# Patient Record
Sex: Female | Born: 1980 | Race: Black or African American | Hispanic: No | Marital: Single | Smoking: Never smoker
Health system: Southern US, Community
[De-identification: ages and names within clinical notes are randomized; demographics above are authoritative.]

## PROBLEM LIST (undated history)

## (undated) ENCOUNTER — Inpatient Hospital Stay (HOSPITAL_COMMUNITY): Payer: Self-pay

## (undated) DIAGNOSIS — D649 Anemia, unspecified: Secondary | ICD-10-CM

## (undated) DIAGNOSIS — R519 Headache, unspecified: Secondary | ICD-10-CM

## (undated) DIAGNOSIS — R87629 Unspecified abnormal cytological findings in specimens from vagina: Secondary | ICD-10-CM

## (undated) DIAGNOSIS — O139 Gestational [pregnancy-induced] hypertension without significant proteinuria, unspecified trimester: Secondary | ICD-10-CM

## (undated) DIAGNOSIS — F32A Depression, unspecified: Secondary | ICD-10-CM

## (undated) DIAGNOSIS — E669 Obesity, unspecified: Secondary | ICD-10-CM

## (undated) DIAGNOSIS — K219 Gastro-esophageal reflux disease without esophagitis: Secondary | ICD-10-CM

## (undated) DIAGNOSIS — B999 Unspecified infectious disease: Secondary | ICD-10-CM

## (undated) DIAGNOSIS — F329 Major depressive disorder, single episode, unspecified: Secondary | ICD-10-CM

## (undated) DIAGNOSIS — R51 Headache: Secondary | ICD-10-CM

## (undated) DIAGNOSIS — I10 Essential (primary) hypertension: Secondary | ICD-10-CM

## (undated) DIAGNOSIS — F419 Anxiety disorder, unspecified: Secondary | ICD-10-CM

## (undated) HISTORY — DX: Gestational (pregnancy-induced) hypertension without significant proteinuria, unspecified trimester: O13.9

## (undated) HISTORY — DX: Essential (primary) hypertension: I10

## (undated) HISTORY — PX: EXTERNAL EAR SURGERY: SHX627

## (undated) HISTORY — PX: THERAPEUTIC ABORTION: SHX798

---

## 2012-08-04 ENCOUNTER — Emergency Department (HOSPITAL_COMMUNITY)
Admission: EM | Admit: 2012-08-04 | Discharge: 2012-08-05 | Disposition: A | Discharge status: Home / Self Care | Payer: Self-pay | Attending: Emergency Medicine | Admitting: Emergency Medicine

## 2012-08-04 DIAGNOSIS — R110 Nausea: Secondary | ICD-10-CM | POA: Insufficient documentation

## 2012-08-04 DIAGNOSIS — B9789 Other viral agents as the cause of diseases classified elsewhere: Secondary | ICD-10-CM | POA: Insufficient documentation

## 2012-08-04 DIAGNOSIS — R51 Headache: Secondary | ICD-10-CM | POA: Insufficient documentation

## 2012-08-04 DIAGNOSIS — R0602 Shortness of breath: Secondary | ICD-10-CM | POA: Insufficient documentation

## 2012-08-04 DIAGNOSIS — R509 Fever, unspecified: Secondary | ICD-10-CM | POA: Insufficient documentation

## 2012-08-04 DIAGNOSIS — R05 Cough: Secondary | ICD-10-CM | POA: Insufficient documentation

## 2012-08-04 DIAGNOSIS — J029 Acute pharyngitis, unspecified: Secondary | ICD-10-CM | POA: Insufficient documentation

## 2012-08-04 DIAGNOSIS — H9209 Otalgia, unspecified ear: Secondary | ICD-10-CM | POA: Insufficient documentation

## 2012-08-04 DIAGNOSIS — R059 Cough, unspecified: Secondary | ICD-10-CM | POA: Insufficient documentation

## 2012-08-04 DIAGNOSIS — R000 Tachycardia, unspecified: Secondary | ICD-10-CM | POA: Insufficient documentation

## 2012-08-05 ENCOUNTER — Emergency Department (HOSPITAL_COMMUNITY): Payer: Self-pay

## 2012-08-05 ENCOUNTER — Encounter (HOSPITAL_COMMUNITY): Payer: Self-pay | Admitting: Emergency Medicine

## 2012-08-05 MED ORDER — OSELTAMIVIR PHOSPHATE 75 MG PO CAPS: 75.0000 mg | ORAL_CAPSULE | Freq: Every day | ORAL | Status: DC

## 2012-08-05 MED ORDER — ONDANSETRON 8 MG PO TBDP: 8.0000 mg | ORAL_TABLET | Freq: Three times a day (TID) | ORAL | Status: DC | PRN

## 2012-08-05 MED ORDER — SODIUM CHLORIDE 0.9 % IV SOLN: 1000.0000 mL | INTRAVENOUS | Status: DC

## 2012-08-05 MED ORDER — MORPHINE SULFATE 4 MG/ML IJ SOLN
4.0000 mg | Freq: Once | INTRAMUSCULAR | Status: AC
  Administered 2012-08-05: 4 mg via INTRAVENOUS
  Filled 2012-08-05: qty 1

## 2012-08-05 MED ORDER — ONDANSETRON HCL 4 MG/2ML IJ SOLN
4.0000 mg | Freq: Once | INTRAMUSCULAR | Status: AC
  Administered 2012-08-05: 4 mg via INTRAVENOUS
  Filled 2012-08-05: qty 2

## 2012-08-05 MED ORDER — ACETAMINOPHEN 500 MG PO TABS
1000.0000 mg | ORAL_TABLET | Freq: Once | ORAL | Status: AC
  Administered 2012-08-05: 1000 mg via ORAL
  Filled 2012-08-05: qty 2

## 2012-08-05 MED ORDER — SODIUM CHLORIDE 0.9 % IV SOLN
1000.0000 mL | Freq: Once | INTRAVENOUS | Status: AC
  Administered 2012-08-05: 1000 mL via INTRAVENOUS

## 2012-08-05 NOTE — ED Notes (Signed)
Pt c/o chest congestion, body aches, HA, nausea, chills, fever, SHOB, sore throat, R ear pain onset yesterday

## 2012-08-05 NOTE — ED Provider Notes (Signed)
History     CSN: [id number]  Arrival date & time 08/04/12  2355   First MD Initiated Contact with Patient 08/05/12 0031      Chief Complaint  Patient presents with  . Influenza    (Consider location/radiation/quality/duration/timing/severity/associated sxs/prior treatment) The history is provided by the patient.   the patient reports myalgias headache nausea chills and fever over the past 24-48 hours.  She's also had some sore throat and shortness of breath.  She reports cough without productive nature.  She denies unilateral leg swelling.  No history of heart failure.  She's also had some right ear pain without drainage.  She overall feels poor and is concerned she might have influenza.  She's had no recent sick contacts.  She did not receive her influenza vaccine this year.  The patient is morbidly obese at 390 pounds.  History reviewed. No pertinent past medical history.  Past Surgical History  Procedure Laterality Date  . External ear surgery      No family history on file.  History  Substance Use Topics  . Smoking status: Never Smoker   . Smokeless tobacco: Not on file  . Alcohol Use: No    OB History   Grav Para Term Preterm Abortions TAB SAB Ect Mult Living                  Review of Systems  All other systems reviewed and are negative.    Allergies  Review of patient's allergies indicates no known allergies.  Home Medications   Current Outpatient Rx  Name  Route  Sig  Dispense  Refill  . acetaminophen (TYLENOL) 500 MG tablet   Oral   Take 500 mg by mouth every 6 (six) hours as needed for pain. For fever/pain         . ibuprofen (ADVIL,MOTRIN) 200 MG tablet   Oral   Take 400 mg by mouth every 4 (four) hours as needed for pain. For fever/pain         . ondansetron (ZOFRAN ODT) 8 MG disintegrating tablet   Oral   Take 1 tablet (8 mg total) by mouth every 8 (eight) hours as needed for nausea.   10 tablet   0   . oseltamivir (TAMIFLU) 75 MG  capsule   Oral   Take 1 capsule (75 mg total) by mouth daily.   5 capsule   0     BP 137/70  Pulse 119  Temp(Src) 101.9 F (38.8 C) (Oral)  Resp 22  Ht 5\' 7"  (1.702 m)  Wt 390 lb (176.903 kg)  BMI 61.07 kg/m2  SpO2 97%  LMP 07/22/2012  Physical Exam  Nursing note and vitals reviewed. Constitutional: She is oriented to person, place, and time. She appears well-developed and well-nourished. No distress.  HENT:  Head: Normocephalic and atraumatic. No trismus in the jaw.  Right Ear: Tympanic membrane, external ear and ear canal normal.  Left Ear: Tympanic membrane and external ear normal.  Mouth/Throat: Uvula is midline and oropharynx is clear and moist. No edematous. No oropharyngeal exudate, posterior oropharyngeal edema, posterior oropharyngeal erythema or tonsillar abscesses.  Eyes: EOM are normal.  Neck: Normal range of motion.  Cardiovascular: Regular rhythm and normal heart sounds.   Tachycardia  Pulmonary/Chest: Effort normal and breath sounds normal.  Abdominal: Soft. She exhibits no distension. There is no tenderness.  Musculoskeletal: Normal range of motion.  Neurological: She is alert and oriented to person, place, and time.  Skin: Skin is  warm and dry.  Psychiatric: She has a normal mood and affect. Judgment normal.    ED Course  Procedures (including critical care time)  Labs Reviewed - No data to display Dg Chest 2 View  08/05/2012  *RADIOLOGY REPORT*  Clinical Data: Upper chest pain, cough, congestion and shortness of breath.  CHEST - 2 VIEW  Comparison: None.  Findings: The lungs are relatively well-aerated.  Minimal left- sided atelectasis is noted.  There is no evidence of focal opacification, pleural effusion or pneumothorax.  The heart is normal in size; the mediastinal contour is within normal limits.  No acute osseous abnormalities are seen.  IMPRESSION: Minimal left-sided atelectasis noted; lungs otherwise clear.   Original Report Authenticated By:  Tonia Ghent, M.D.    I personally reviewed the imaging tests through PACS system I reviewed available ER/hospitalization records through the EMR    1. Influenza-like illness       MDM  2:32 AM ] Patient feels much better at this time.  Heart rate is improving.  This is likely flulike illness.  Home with nausea medicine on Tamiflu.  The patient understands return to ER for new or worsening symptoms.  No hypoxia.  Chest x-ray clear.        Lyanne Co, MD 08/05/12 [phone]

## 2012-10-03 ENCOUNTER — Encounter (HOSPITAL_COMMUNITY): Payer: Self-pay

## 2012-10-03 ENCOUNTER — Emergency Department (HOSPITAL_COMMUNITY)
Admission: EM | Admit: 2012-10-03 | Discharge: 2012-10-03 | Disposition: A | Discharge status: Home Health | Payer: No Typology Code available for payment source | Source: Home / Self Care

## 2012-10-03 DIAGNOSIS — I10 Essential (primary) hypertension: Secondary | ICD-10-CM

## 2012-10-03 LAB — CBC
MCH: 24.5 pg — ABNORMAL LOW (ref 26.0–34.0)
MCV: 75.7 fL — ABNORMAL LOW (ref 78.0–100.0)
Platelets: 320 10*3/uL (ref 150–400)
RDW: 15.2 % (ref 11.5–15.5)

## 2012-10-03 LAB — HEMOGLOBIN A1C: Hgb A1c MFr Bld: 6 % — ABNORMAL HIGH (ref <5.7)

## 2012-10-03 LAB — BASIC METABOLIC PANEL
CO2: 28 mEq/L (ref 19–32)
Calcium: 8.8 mg/dL (ref 8.4–10.5)
Creatinine, Ser: 0.91 mg/dL (ref 0.50–1.10)
GFR calc non Af Amer: 83 mL/min — ABNORMAL LOW (ref >90)
Glucose, Bld: 100 mg/dL — ABNORMAL HIGH (ref 70–99)
Sodium: 138 mEq/L (ref 135–145)

## 2012-10-03 LAB — LIPID PANEL
LDL Cholesterol: 106 mg/dL — ABNORMAL HIGH (ref 0–99)
VLDL: 14 mg/dL (ref 0–40)

## 2012-10-03 NOTE — ED Provider Notes (Signed)
History     CSN: [id number]  Arrival date & time 10/03/12  1021   First MD Initiated Contact with Patient 10/03/12 1033      Chief Complaint  Patient presents with  . Hypertension    (Consider location/radiation/quality/duration/timing/severity/associated sxs/prior treatment) HPI  History reviewed. No pertinent past medical history.  Past Surgical History  Procedure Laterality Date  . External ear surgery      Family history of HTN  History  Substance Use Topics  . Smoking status: Never Smoker   . Smokeless tobacco: Not on file  . Alcohol Use: No    OB History   Grav Para Term Preterm Abortions TAB SAB Ect Mult Living                 Review of Systems Constitutional: Negative for fever, chills, diaphoresis, activity change, appetite change and fatigue.  HENT: Negative for ear pain, nosebleeds, congestion, facial swelling, rhinorrhea, neck pain, neck stiffness and ear discharge.   Eyes: Negative for pain, discharge, redness, itching and visual disturbance.  Respiratory: Negative for cough, choking, chest tightness, shortness of breath, wheezing and stridor.   Cardiovascular: Negative for chest pain, palpitations and leg swelling.  Gastrointestinal: Negative for abdominal distention.  Genitourinary: Negative for dysuria, urgency, frequency, hematuria, flank pain, decreased urine volume, difficulty urinating and dyspareunia.  Musculoskeletal: Negative for back pain, joint swelling, arthralgias and gait problem.  Neurological: Negative for dizziness, tremors, seizures, syncope, facial asymmetry, speech difficulty, weakness, light-headedness, numbness and headaches.  Hematological: Negative for adenopathy. Does not bruise/bleed easily.  Psychiatric/Behavioral: Negative for hallucinations, behavioral problems, confusion, dysphoric mood, decreased concentration and agitation.   Allergies  Review of patient's allergies indicates no known allergies.  Home Medications    Current Outpatient Rx  Name  Route  Sig  Dispense  Refill  . acetaminophen (TYLENOL) 500 MG tablet   Oral   Take 500 mg by mouth every 6 (six) hours as needed for pain. For fever/pain         . ibuprofen (ADVIL,MOTRIN) 200 MG tablet   Oral   Take 400 mg by mouth every 4 (four) hours as needed for pain. For fever/pain         . ondansetron (ZOFRAN ODT) 8 MG disintegrating tablet   Oral   Take 1 tablet (8 mg total) by mouth every 8 (eight) hours as needed for nausea.   10 tablet   0   . oseltamivir (TAMIFLU) 75 MG capsule   Oral   Take 1 capsule (75 mg total) by mouth daily.   5 capsule   0     BP 138/80  Pulse 68  Temp(Src) 98.7 F (37.1 C) (Oral)  Resp 16  SpO2 100%  Physical Exam Constitutional: Appears well-developed and well-nourished. No distress.  HENT: Normocephalic. External right and left ear normal. Oropharynx is clear and moist.  Eyes: Conjunctivae and EOM are normal. PERRLA, no scleral icterus.  Neck: Normal ROM. Neck supple. No JVD. No tracheal deviation. No thyromegaly.  CVS: RRR, S1/S2 +, no murmurs, no gallops, no carotid bruit.  Pulmonary: Effort and breath sounds normal, no stridor, rhonchi, wheezes, rales.  Abdominal: Soft. BS +,  no distension, tenderness, rebound or guarding.  Musculoskeletal: Normal range of motion. No edema and no tenderness.  Lymphadenopathy: No lymphadenopathy noted, cervical, inguinal. Neuro: Alert. Normal reflexes, muscle tone coordination. No cranial nerve deficit. Skin: Skin is warm and dry. No rash noted. Not diaphoretic. No erythema. No pallor.  Psychiatric: Normal mood and  affect. Behavior, judgment, thought content normal.   ED Course  Procedures (including critical care time)  Labs Reviewed - No data to display No results found.  HTN - Reasonable control, we have discussed importance of checking blood pressure regularly and patient advised to call us back if the numbers are persistently higher than 140/90 -  Will goahead and check electrolyte panel, will also check A1c since patient has family history of diabetes - Will also check lipid panel and will add statin if indicated  MDM  HTN, blood work including electrolyte panel, CBC, A1c, lipid panel       Dorothea Ogle, MD 10/03/12 1100

## 2012-10-03 NOTE — ED Notes (Addendum)
Patient is here to establish care Needs a physical

## 2012-11-01 ENCOUNTER — Telehealth: Payer: Self-pay | Admitting: General Practice

## 2012-11-01 NOTE — Telephone Encounter (Signed)
Pt came in and had labs done about 1 month ago and has not been called with results.

## 2012-11-03 ENCOUNTER — Telehealth: Payer: Self-pay

## 2012-11-03 NOTE — Telephone Encounter (Signed)
Lab results given Per dr Laural Benes we reccomended that patient start taking over the counter Iron and follow up in 2 months

## 2013-01-22 ENCOUNTER — Emergency Department (HOSPITAL_COMMUNITY): Payer: No Typology Code available for payment source

## 2013-01-22 ENCOUNTER — Emergency Department (HOSPITAL_COMMUNITY)
Admission: EM | Admit: 2013-01-22 | Discharge: 2013-01-22 | Disposition: A | Discharge status: Home / Self Care | Payer: No Typology Code available for payment source | Attending: Emergency Medicine | Admitting: Emergency Medicine

## 2013-01-22 ENCOUNTER — Encounter (HOSPITAL_COMMUNITY): Payer: Self-pay | Admitting: Emergency Medicine

## 2013-01-22 DIAGNOSIS — M7989 Other specified soft tissue disorders: Secondary | ICD-10-CM | POA: Insufficient documentation

## 2013-01-22 DIAGNOSIS — M25559 Pain in unspecified hip: Secondary | ICD-10-CM | POA: Insufficient documentation

## 2013-01-22 DIAGNOSIS — M79609 Pain in unspecified limb: Secondary | ICD-10-CM | POA: Insufficient documentation

## 2013-01-22 DIAGNOSIS — M79605 Pain in left leg: Secondary | ICD-10-CM

## 2013-01-22 DIAGNOSIS — M25552 Pain in left hip: Secondary | ICD-10-CM

## 2013-01-22 MED ORDER — HYDROCODONE-ACETAMINOPHEN 5-325 MG PO TABS
2.0000 | ORAL_TABLET | Freq: Once | ORAL | Status: AC
  Administered 2013-01-22: 2 via ORAL
  Filled 2013-01-22: qty 2

## 2013-01-22 MED ORDER — TRAMADOL HCL 50 MG PO TABS: 50.0000 mg | ORAL_TABLET | Freq: Four times a day (QID) | ORAL | Status: DC | PRN

## 2013-01-22 NOTE — ED Provider Notes (Addendum)
CSN: [id number]     Arrival date & time 01/22/13  1222 History     First MD Initiated Contact with Patient 01/22/13 1242     Chief Complaint  Patient presents with  . Leg Swelling  . Leg Pain   (Consider location/radiation/quality/duration/timing/severity/associated sxs/prior Treatment) Patient is a 32 y.o. female presenting with leg pain. The history is provided by the patient.  Leg Pain Associated symptoms: no back pain, no fever and no neck pain   pt c/o left hip and leg pain for past day. Constant. Dull. Moderate. Denies hx same pain. States leg feels swollen.  Denies hx dvt or pe.  No skin changes, redness, rash or lesions.  Denies low back or buttock pain. No radicular pain. No hx ddd.  No numbness/weakness. Denies specific exacerbating or alleviating factors. Is ambulatory. Denies recent trauma or fall. No cp or sob. States in all other respects seems in normal well state of health. No fever or chills.     History reviewed. No pertinent past medical history. Past Surgical History  Procedure Laterality Date  . External ear surgery     No family history on file. History  Substance Use Topics  . Smoking status: Never Smoker   . Smokeless tobacco: Not on file  . Alcohol Use: No   OB History   Grav Para Term Preterm Abortions TAB SAB Ect Mult Living                 Review of Systems  Constitutional: Negative for fever and chills.  HENT: Negative for neck pain.   Eyes: Negative for redness.  Respiratory: Negative for shortness of breath.   Cardiovascular: Negative for chest pain.  Gastrointestinal: Negative for abdominal pain.  Genitourinary: Negative for flank pain and pelvic pain.  Musculoskeletal: Negative for back pain and gait problem.  Skin: Negative for rash.  Neurological: Negative for weakness, numbness and headaches.  Hematological: Does not bruise/bleed easily.  Psychiatric/Behavioral: Negative for confusion.    Allergies  Review of patient's allergies  indicates no known allergies.  Home Medications   Current Outpatient Rx  Name  Route  Sig  Dispense  Refill  . Aspirin-Caffeine (BACK & BODY EXTRA STRENGTH) 500-32.5 MG TABS   Oral   Take 2 tablets by mouth every 4 (four) hours as needed (pain).         . traMADol (ULTRAM) 50 MG tablet   Oral   Take 25 mg by mouth once.          BP 125/74  Pulse 78  Temp(Src) 98.7 F (37.1 C) (Oral)  Resp 17  SpO2 100% Physical Exam  Nursing note and vitals reviewed. Constitutional: She appears well-developed and well-nourished. No distress.  HENT:  Head: Atraumatic.  Eyes: Conjunctivae are normal. No scleral icterus.  Neck: Neck supple. No tracheal deviation present.  Cardiovascular: Normal rate, regular rhythm, normal heart sounds and intact distal pulses.   Pulmonary/Chest: Effort normal and breath sounds normal. No respiratory distress.  Abdominal: Soft. Normal appearance. She exhibits no distension. There is no tenderness.  Morbidly obese  Musculoskeletal: She exhibits no edema and no tenderness.  ls spine non tender.  Pain/tenderness left hip laterally.  No skin changes or sts noted. Distal pulses palp. Good rom at hip, knee and ankle without pain. No other focal bony tenderness noted.   Neurological: She is alert.  Skin: Skin is warm and dry. No rash noted.  Psychiatric: She has a normal mood and affect.  ED Course   Procedures (including critical care time)  Dg Hip Complete Left  01/22/2013   *RADIOLOGY REPORT*  Clinical Data: Pain  LEFT HIP - COMPLETE 2+ VIEW  Comparison: None.  Findings: Three views of the left hip submitted.  No acute fracture or subluxation.  No radiopaque foreign body.  IMPRESSION: No acute fracture or subluxation.   Original Report Authenticated By: Natasha Mead, M.D.     Patient Name Sex DOB SSN   Glass Francess, Mullen Female [birth year] [id number]            Progress Notes signed by Smiley Houseman, RVT at 01/22/2013 2:44 PM    Author: Smiley Houseman, RVT Service: Vascular Lab Author Type: Cardiovascular Sonographer   Filed: 01/22/2013 2:44 PM Note Time: 01/22/2013 2:43 PM         Left lower extremity venous duplex completed. Left: No evidence of DVT, superficial thrombosis, or Baker's cyst. Right: Negative for DVT in the common femoral vein.        MDM  Pt states she has a ride, does not have to drive. No meds pta.  vicodin po.  Xr.  Vascular ultrasound.  Reviewed nursing notes and prior charts for additional history.   Vascular tech indicates study negative for dvt.  Pt afeb.  Comfortable on recheck. xr neg. vasc doppler neg.  Pt appears stable for d/c.   Discussed close pcp follow up. Pt states pcp is Dr Laural Benes.      Suzi Roots, MD 01/22/13 1450

## 2013-01-22 NOTE — Progress Notes (Signed)
Left lower extremity venous duplex completed.  Left:  No evidence of DVT, superficial thrombosis, or Baker's cyst.  Right:  Negative for DVT in the common femoral vein.  

## 2013-01-22 NOTE — ED Notes (Signed)
Pt c/o of left leg pain 10/10. Noticed swelling to left leg last night. Denies n/v/d. No hx of DVT.

## 2013-01-22 NOTE — Progress Notes (Signed)
Pt confirms pcp is [facility] updated

## 2013-03-05 ENCOUNTER — Encounter: Payer: Self-pay | Admitting: Internal Medicine

## 2013-03-05 ENCOUNTER — Ambulatory Visit: Payer: No Typology Code available for payment source | Attending: Internal Medicine | Admitting: Internal Medicine

## 2013-03-05 VITALS — BP 142/84 | HR 84 | Temp 98.6°F | Resp 16 | Ht 67.72 in | Wt >= 6400 oz

## 2013-03-05 DIAGNOSIS — R05 Cough: Secondary | ICD-10-CM

## 2013-03-05 DIAGNOSIS — Z0000 Encounter for general adult medical examination without abnormal findings: Secondary | ICD-10-CM

## 2013-03-05 LAB — POCT GLYCOSYLATED HEMOGLOBIN (HGB A1C): Hemoglobin A1C: 5.8

## 2013-03-05 MED ORDER — GUAIFENESIN ER 600 MG PO TB12: 1200.0000 mg | ORAL_TABLET | Freq: Two times a day (BID) | ORAL | Status: DC | PRN

## 2013-03-05 MED ORDER — AZITHROMYCIN 500 MG PO TABS: 500.0000 mg | ORAL_TABLET | Freq: Every day | ORAL | Status: DC

## 2013-03-05 NOTE — Progress Notes (Signed)
Patient ID: Jillian Reed, female   DOB: 02-11-1981, 32 y.o.   MRN: [id number]  CC: congestion      HPI: 32 year old female with no significant past medical history presents with on and off cough and congestion for past 2 weeks prior to this visit. Patient reports sometimes having choking sensation when coughing spells occur. She does not take anything for cough. She reports associated subjective fever but no chills. No chest pain. No palpitations. Patient also reports generalized weakness. No bowel pain, nausea or vomiting. No diarrhea.  No Known Allergies No past medical history on file. Current Outpatient Prescriptions on File Prior to Visit  Medication Sig Dispense Refill  . Aspirin-Caffeine (BACK & BODY EXTRA STRENGTH) 500-32.5 MG TABS Take 2 tablets by mouth every 4 (four) hours as needed (pain).      . traMADol (ULTRAM) 50 MG tablet Take 25 mg by mouth once.      . traMADol (ULTRAM) 50 MG tablet Take 1 tablet (50 mg total) by mouth every 6 (six) hours as needed for pain.  20 tablet  0   No current facility-administered medications on file prior to visit.   Family medical history significant for HTN, HLD  History   Social History  . Marital Status: Single    Spouse Name: N/A    Number of Children: N/A  . Years of Education: N/A   Occupational History  . Not on file.   Social History Main Topics  . Smoking status: Never Smoker   . Smokeless tobacco: Not on file  . Alcohol Use: No  . Drug Use: No  . Sexual Activity: Not on file   Other Topics Concern  . Not on file   Social History Narrative  . No narrative on file    Review of Systems  Constitutional: positive for fever, no chills, diaphoresis, activity change, appetite change and fatigue.  HENT: Negative for ear pain, nosebleeds, congestion, facial swelling, rhinorrhea, neck pain, neck stiffness and ear discharge.   Eyes: Negative for pain, discharge, redness, itching and visual disturbance.  Respiratory:  positive for cough, no choking, chest tightness, shortness of breath, wheezing and stridor.   Cardiovascular: Negative for chest pain, palpitations and leg swelling.  Gastrointestinal: Negative for abdominal distention.  Genitourinary: Negative for dysuria, urgency, frequency, hematuria, flank pain, decreased urine volume, difficulty urinating and dyspareunia.  Musculoskeletal: Negative for back pain, joint swelling, arthralgias and gait problem.  Neurological: Negative for dizziness, tremors, seizures, syncope, facial asymmetry, speech difficulty, weakness, light-headedness, numbness and headaches.  Hematological: Negative for adenopathy. Does not bruise/bleed easily.  Psychiatric/Behavioral: Negative for hallucinations, behavioral problems, confusion, dysphoric mood, decreased concentration and agitation.    Objective:   Filed Vitals:   03/05/13 1247  BP: 142/84  Pulse: 84  Temp: 98.6 F (37 C)  Resp: 16    Physical Exam  Constitutional: Appears well-developed and well-nourished. No distress.  HENT: Normocephalic. External right and left ear normal. Oropharynx is clear and moist.  Eyes: Conjunctivae and EOM are normal. PERRLA, no scleral icterus.  Neck: Normal ROM. Neck supple. No JVD. No tracheal deviation. No thyromegaly.  CVS: RRR, S1/S2 +, no murmurs, no gallops, no carotid bruit.  Pulmonary: Effort and breath sounds normal, no stridor, rhonchi, wheezes, rales.  Abdominal: Soft. BS +,  no distension, tenderness, rebound or guarding.  Musculoskeletal: Normal range of motion. No edema and no tenderness.  Lymphadenopathy: No lymphadenopathy noted, cervical, inguinal. Neuro: Alert. Normal reflexes, muscle tone coordination. No cranial nerve deficit. Skin:  Skin is warm and dry. No rash noted. Not diaphoretic. No erythema. No pallor.  Psychiatric: Normal mood and affect. Behavior, judgment, thought content normal.   Lab Results  Component Value Date   WBC 8.8 10/03/2012   HGB  10.9* 10/03/2012   HCT 33.7* 10/03/2012   MCV 75.7* 10/03/2012   PLT 320 10/03/2012   Lab Results  Component Value Date   CREATININE 0.91 10/03/2012   BUN 13 10/03/2012   NA 138 10/03/2012   K 3.7 10/03/2012   CL 101 10/03/2012   CO2 28 10/03/2012    Lab Results  Component Value Date   HGBA1C 6.0* 10/03/2012   Lipid Panel     Component Value Date/Time   CHOL 174 10/03/2012 1056   TRIG 72 10/03/2012 1056   HDL 54 10/03/2012 1056   CHOLHDL 3.2 10/03/2012 1056   VLDL 14 10/03/2012 1056   LDLCALC 106* 10/03/2012 1056       Assessment and plan:   Patient Active Problem List   Diagnosis Date Noted  . Cough 03/05/2013    Priority: High - prescription provided for azithromycin 500 mg daily for 5 days - mucinex prn

## 2013-03-05 NOTE — Progress Notes (Signed)
Pt is here for F/U visit. Pt reports having a tickle in her throat. When she coughs sharp pain shoot down neck, chest and her Left arm goes numb.

## 2013-03-05 NOTE — Patient Instructions (Addendum)
Health Maintenance, Females A healthy lifestyle and preventative care can promote health and wellness.  Maintain regular health, dental, and eye exams.  Eat a healthy diet. Foods like vegetables, fruits, whole grains, low-fat dairy products, and lean protein foods contain the nutrients you need without too many calories. Decrease your intake of foods high in solid fats, added sugars, and salt. Get information about a proper diet from your caregiver, if necessary.  Regular physical exercise is one of the most important things you can do for your health. Most adults should get at least 150 minutes of moderate-intensity exercise (any activity that increases your heart rate and causes you to sweat) each week. In addition, most adults need muscle-strengthening exercises on 2 or more days a week.   Maintain a healthy weight. The body mass index (BMI) is a screening tool to identify possible weight problems. It provides an estimate of body fat based on height and weight. Your caregiver can help determine your BMI, and can help you achieve or maintain a healthy weight. For adults 20 years and older:  A BMI below 18.5 is considered underweight.  A BMI of 18.5 to 24.9 is normal.  A BMI of 25 to 29.9 is considered overweight.  A BMI of 30 and above is considered obese.  Maintain normal blood lipids and cholesterol by exercising and minimizing your intake of saturated fat. Eat a balanced diet with plenty of fruits and vegetables. Blood tests for lipids and cholesterol should begin at age 20 and be repeated every 5 years. If your lipid or cholesterol levels are high, you are over 50, or you are a high risk for heart disease, you may need your cholesterol levels checked more frequently.Ongoing high lipid and cholesterol levels should be treated with medicines if diet and exercise are not effective.  If you smoke, find out from your caregiver how to quit. If you do not use tobacco, do not start.  If you  are pregnant, do not drink alcohol. If you are breastfeeding, be very cautious about drinking alcohol. If you are not pregnant and choose to drink alcohol, do not exceed 1 drink per day. One drink is considered to be 12 ounces (355 mL) of beer, 5 ounces (148 mL) of wine, or 1.5 ounces (44 mL) of liquor.  Avoid use of street drugs. Do not share needles with anyone. Ask for help if you need support or instructions about stopping the use of drugs.  High blood pressure causes heart disease and increases the risk of stroke. Blood pressure should be checked at least every 1 to 2 years. Ongoing high blood pressure should be treated with medicines, if weight loss and exercise are not effective.  If you are 55 to 32 years old, ask your caregiver if you should take aspirin to prevent strokes.  Diabetes screening involves taking a blood sample to check your fasting blood sugar level. This should be done once every 3 years, after age 45, if you are within normal weight and without risk factors for diabetes. Testing should be considered at a younger age or be carried out more frequently if you are overweight and have at least 1 risk factor for diabetes.  Breast cancer screening is essential preventative care for women. You should practice "breast self-awareness." This means understanding the normal appearance and feel of your breasts and may include breast self-examination. Any changes detected, no matter how small, should be reported to a caregiver. Women in their 20s and 30s should have   a clinical breast exam (CBE) by a caregiver as part of a regular health exam every 1 to 3 years. After age 40, women should have a CBE every year. Starting at age 40, women should consider having a mammogram (breast X-ray) every year. Women who have a family history of breast cancer should talk to their caregiver about genetic screening. Women at a high risk of breast cancer should talk to their caregiver about having an MRI and a  mammogram every year.  The Pap test is a screening test for cervical cancer. Women should have a Pap test starting at age 21. Between ages 21 and 29, Pap tests should be repeated every 2 years. Beginning at age 30, you should have a Pap test every 3 years as long as the past 3 Pap tests have been normal. If you had a hysterectomy for a problem that was not cancer or a condition that could lead to cancer, then you no longer need Pap tests. If you are between ages 65 and 70, and you have had normal Pap tests going back 10 years, you no longer need Pap tests. If you have had past treatment for cervical cancer or a condition that could lead to cancer, you need Pap tests and screening for cancer for at least 20 years after your treatment. If Pap tests have been discontinued, risk factors (such as a new sexual partner) need to be reassessed to determine if screening should be resumed. Some women have medical problems that increase the chance of getting cervical cancer. In these cases, your caregiver may recommend more frequent screening and Pap tests.  The human papillomavirus (HPV) test is an additional test that may be used for cervical cancer screening. The HPV test looks for the virus that can cause the cell changes on the cervix. The cells collected during the Pap test can be tested for HPV. The HPV test could be used to screen women aged 30 years and older, and should be used in women of any age who have unclear Pap test results. After the age of 30, women should have HPV testing at the same frequency as a Pap test.  Colorectal cancer can be detected and often prevented. Most routine colorectal cancer screening begins at the age of 50 and continues through age 75. However, your caregiver may recommend screening at an earlier age if you have risk factors for colon cancer. On a yearly basis, your caregiver may provide home test kits to check for hidden blood in the stool. Use of a small camera at the end of a  tube, to directly examine the colon (sigmoidoscopy or colonoscopy), can detect the earliest forms of colorectal cancer. Talk to your caregiver about this at age 50, when routine screening begins. Direct examination of the colon should be repeated every 5 to 10 years through age 75, unless early forms of pre-cancerous polyps or small growths are found.  Hepatitis C blood testing is recommended for all people born from 1945 through 1965 and any individual with known risks for hepatitis C.  Practice safe sex. Use condoms and avoid high-risk sexual practices to reduce the spread of sexually transmitted infections (STIs). Sexually active women aged 25 and younger should be checked for Chlamydia, which is a common sexually transmitted infection. Older women with new or multiple partners should also be tested for Chlamydia. Testing for other STIs is recommended if you are sexually active and at increased risk.  Osteoporosis is a disease in which the   bones lose minerals and strength with aging. This can result in serious bone fractures. The risk of osteoporosis can be identified using a bone density scan. Women ages 57 and over and women at risk for fractures or osteoporosis should discuss screening with their caregivers. Ask your caregiver whether you should be taking a calcium supplement or vitamin D to reduce the rate of osteoporosis.  Menopause can be associated with physical symptoms and risks. Hormone replacement therapy is available to decrease symptoms and risks. You should talk to your caregiver about whether hormone replacement therapy is right for you.  Use sunscreen with a sun protection factor (SPF) of 30 or greater. Apply sunscreen liberally and repeatedly throughout the day. You should seek shade when your shadow is shorter than you. Protect yourself by wearing long sleeves, pants, a wide-brimmed hat, and sunglasses year round, whenever you are outdoors.  Notify your caregiver of new moles or  changes in moles, especially if there is a change in shape or color. Also notify your caregiver if a mole is larger than the size of a pencil eraser.  Stay current with your immunizations. Document Released: 12/21/2010 Document Revised: 08/30/2011 Document Reviewed: 12/21/2010 [facility] Patient Information 2014 [place], [place]. Cough, Adult  A cough is a reflex that helps clear your throat and airways. It can help heal the body or may be a reaction to an irritated airway. A cough may only last 2 or 3 weeks (acute) or may last more than 8 weeks (chronic).  CAUSES Acute cough:  Viral or bacterial infections. Chronic cough:  Infections.  Allergies.  Asthma.  Post-nasal drip.  Smoking.  Heartburn or acid reflux.  Some medicines.  Chronic lung problems (COPD).  Cancer. SYMPTOMS   Cough.  Fever.  Chest pain.  Increased breathing rate.  High-pitched whistling sound when breathing (wheezing).  Colored mucus that you cough up (sputum). TREATMENT   A bacterial cough may be treated with antibiotic medicine.  A viral cough must run its course and will not respond to antibiotics.  Your caregiver may recommend other treatments if you have a chronic cough. HOME CARE INSTRUCTIONS   Only take over-the-counter or prescription medicines for pain, discomfort, or fever as directed by your caregiver. Use cough suppressants only as directed by your caregiver.  Use a cold steam vaporizer or humidifier in your bedroom or home to help loosen secretions.  Sleep in a semi-upright position if your cough is worse at night.  Rest as needed.  Stop smoking if you smoke. SEEK IMMEDIATE MEDICAL CARE IF:   You have pus in your sputum.  Your cough starts to worsen.  You cannot control your cough with suppressants and are losing sleep.  You begin coughing up blood.  You have difficulty breathing.  You develop pain which is getting worse or is uncontrolled with medicine.  You have  a fever. MAKE SURE YOU:   Understand these instructions.  Will watch your condition.  Will get help right away if you are not doing well or get worse. Document Released: 12/04/2010 Document Revised: 08/30/2011 Document Reviewed: 12/04/2010 [facility] Patient Information 2014 [place], [place].

## 2013-04-03 ENCOUNTER — Emergency Department (HOSPITAL_COMMUNITY): Payer: No Typology Code available for payment source

## 2013-04-03 ENCOUNTER — Encounter (HOSPITAL_COMMUNITY): Payer: Self-pay | Admitting: Emergency Medicine

## 2013-04-03 ENCOUNTER — Emergency Department (HOSPITAL_COMMUNITY)
Admission: EM | Admit: 2013-04-03 | Discharge: 2013-04-03 | Disposition: A | Discharge status: Home / Self Care | Payer: No Typology Code available for payment source | Attending: Emergency Medicine | Admitting: Emergency Medicine

## 2013-04-03 DIAGNOSIS — R079 Chest pain, unspecified: Secondary | ICD-10-CM | POA: Insufficient documentation

## 2013-04-03 DIAGNOSIS — R059 Cough, unspecified: Secondary | ICD-10-CM | POA: Insufficient documentation

## 2013-04-03 DIAGNOSIS — J3489 Other specified disorders of nose and nasal sinuses: Secondary | ICD-10-CM | POA: Insufficient documentation

## 2013-04-03 DIAGNOSIS — R0981 Nasal congestion: Secondary | ICD-10-CM

## 2013-04-03 DIAGNOSIS — R1110 Vomiting, unspecified: Secondary | ICD-10-CM | POA: Insufficient documentation

## 2013-04-03 DIAGNOSIS — R05 Cough: Secondary | ICD-10-CM | POA: Insufficient documentation

## 2013-04-03 MED ORDER — IPRATROPIUM BROMIDE 0.03 % NA SOLN
2.0000 | Freq: Four times a day (QID) | NASAL | Status: DC
Start: 2013-04-03 — End: 2013-04-03
  Administered 2013-04-03: 2 via NASAL
  Filled 2013-04-03: qty 30

## 2013-04-03 MED ORDER — IPRATROPIUM BROMIDE 0.06 % NA SOLN
2.0000 | Freq: Four times a day (QID) | NASAL | Status: DC
  Filled 2013-04-03: qty 15

## 2013-04-03 MED ORDER — IPRATROPIUM BROMIDE 0.03 % NA SOLN: 2.0000 | Freq: Four times a day (QID) | NASAL | Status: DC

## 2013-04-03 NOTE — Discharge Instructions (Signed)
Cough, Adult  A cough is a reflex. It helps you clear your throat and airways. A cough can help heal your body. A cough can last 2 or 3 weeks (acute) or may last more than 8 weeks (chronic). Some common causes of a cough can include an infection, allergy, or a cold. HOME CARE  Only take medicine as told by your doctor.  If given, take your medicines (antibiotics) as told. Finish them even if you start to feel better.  Use a cold steam vaporizer or humidier in your home. This can help loosen thick spit (secretions).  Sleep so you are almost sitting up (semi-upright). Use pillows to do this. This helps reduce coughing.  Rest as needed.  Stop smoking if you smoke. GET HELP RIGHT AWAY IF:  You have yellowish-white fluid (pus) in your thick spit.  Your cough gets worse.  Your medicine does not reduce coughing, and you are losing sleep.  You cough up blood.  You have trouble breathing.  Your pain gets worse and medicine does not help.  You have a fever. MAKE SURE YOU:   Understand these instructions.  Will watch your condition.  Will get help right away if you are not doing well or get worse. Document Released: 02/18/2011 Document Revised: 08/30/2011 Document Reviewed: 02/18/2011 [facility] Patient Information 2014 [place], [place]. He been given an Atrovent nasal inhaler to decrease the amount of secretions.  Please uses on her regular basis.  Followup with your primary care physician you have also been given incentive spirometer to help you with deep breathing

## 2013-04-03 NOTE — ED Notes (Signed)
Pt given IS and instructed how to use

## 2013-04-03 NOTE — ED Provider Notes (Addendum)
CSN: [id number]     Arrival date & time 04/03/13  0128 History   First MD Initiated Contact with Patient 04/03/13 0158     Chief Complaint  Patient presents with  . Cough  . Emesis   (Consider location/radiation/quality/duration/timing/severity/associated sxs/prior Treatment) HPI Comments: Has been having a intermittent cough for the past 6 weeks.  She was seen by her primary care physician on September 15, with the same complaint of a cough.  That gives her burning sensation in her chest and makes her left arm going on, now she's having post tussive emesis, which has started within the past 2-3, days.  Denies any fever, shortness of breath.  URI, symptoms, history of, asthma, history of hypertension.  She does have a family history of hypertension, and MI.  This patient is a morbidly obese  Patient is a 32 y.o. female presenting with cough and vomiting. The history is provided by the patient.  Cough Cough characteristics:  Non-productive Severity:  Moderate Onset quality:  Gradual Duration:  6 weeks Timing:  Intermittent Progression:  Worsening Smoker: no   Relieved by:  None tried Worsened by:  Activity Associated symptoms: chest pain   Associated symptoms: no shortness of breath   Emesis   History reviewed. No pertinent past medical history. Past Surgical History  Procedure Laterality Date  . External ear surgery     No family history on file. History  Substance Use Topics  . Smoking status: Never Smoker   . Smokeless tobacco: Not on file  . Alcohol Use: Yes   OB History   Grav Para Term Preterm Abortions TAB SAB Ect Mult Living                 Review of Systems  Respiratory: Positive for cough. Negative for shortness of breath.   Cardiovascular: Positive for chest pain.  Gastrointestinal: Positive for vomiting.    Allergies  Tramadol  Home Medications   Current Outpatient Rx  Name  Route  Sig  Dispense  Refill  . ipratropium (ATROVENT) 0.03 % nasal  spray   Nasal   Place 2 sprays into the nose 4 (four) times daily.   30 mL   2    BP 145/79  Pulse 88  Temp(Src) 98.5 F (36.9 C) (Oral)  Resp 18  Ht 5\' 7"  (1.702 m)  Wt 422 lb (191.418 kg)  BMI 66.08 kg/m2  SpO2 100%  LMP 03/22/2013 Physical Exam  Nursing note and vitals reviewed. Constitutional: She appears well-developed and well-nourished.  HENT:  Head: Normocephalic.  Mouth/Throat: Posterior oropharyngeal erythema present.  Eyes: Pupils are equal, round, and reactive to light.  Neck: Normal range of motion.  Cardiovascular: Normal rate.   Abdominal: Soft.  Skin: Skin is warm and dry. No rash noted. No erythema.    ED Course  Procedures (including critical care time) Labs Review Labs Reviewed - No data to display Imaging Review No results found.  EKG Interpretation   None       MDM   1. Nasal congestion   2. Cough        Arman Filter, NP 04/09/13 2039  Arman Filter, NP 04/10/13 2017

## 2013-04-03 NOTE — ED Notes (Signed)
Pt reports a tickle in her throat that started one month ago that preceded to a cough. Now pt states that she has been vomiting with coughing. Pt states that coughing gives her a burning sensation to her mid chest and back and cause her L arm to go numb.

## 2013-04-10 NOTE — ED Provider Notes (Signed)
Medical screening examination/treatment/procedure(s) were performed by non-physician practitioner and as supervising physician I was immediately available for consultation/collaboration.   Ahni Bradwell, MD 04/10/13 0649 

## 2013-04-11 NOTE — ED Provider Notes (Signed)
Medical screening examination/treatment/procedure(s) were performed by non-physician practitioner and as supervising physician I was immediately available for consultation/collaboration.  EKG Interpretation   None         Etoy Mcdonnell, MD 04/11/13 0707 

## 2013-11-09 ENCOUNTER — Emergency Department (HOSPITAL_COMMUNITY)
Admission: EM | Admit: 2013-11-09 | Discharge: 2013-11-09 | Disposition: A | Discharge status: Home / Self Care | Payer: No Typology Code available for payment source | Attending: Emergency Medicine | Admitting: Emergency Medicine

## 2013-11-09 ENCOUNTER — Encounter (HOSPITAL_COMMUNITY): Payer: Self-pay | Admitting: Emergency Medicine

## 2013-11-09 DIAGNOSIS — A599 Trichomoniasis, unspecified: Secondary | ICD-10-CM | POA: Insufficient documentation

## 2013-11-09 DIAGNOSIS — Z9889 Other specified postprocedural states: Secondary | ICD-10-CM | POA: Insufficient documentation

## 2013-11-09 DIAGNOSIS — J069 Acute upper respiratory infection, unspecified: Secondary | ICD-10-CM | POA: Insufficient documentation

## 2013-11-09 DIAGNOSIS — Z3202 Encounter for pregnancy test, result negative: Secondary | ICD-10-CM | POA: Insufficient documentation

## 2013-11-09 LAB — URINALYSIS, ROUTINE W REFLEX MICROSCOPIC
BILIRUBIN URINE: NEGATIVE
GLUCOSE, UA: NEGATIVE mg/dL
HGB URINE DIPSTICK: NEGATIVE
KETONES UR: NEGATIVE mg/dL
Nitrite: NEGATIVE
PH: 7 (ref 5.0–8.0)
Protein, ur: NEGATIVE mg/dL
Specific Gravity, Urine: 1.016 (ref 1.005–1.030)
Urobilinogen, UA: 0.2 mg/dL (ref 0.0–1.0)

## 2013-11-09 LAB — URINE MICROSCOPIC-ADD ON

## 2013-11-09 LAB — PREGNANCY, URINE: Preg Test, Ur: NEGATIVE

## 2013-11-09 MED ORDER — AZITHROMYCIN 250 MG PO TABS
1000.0000 mg | ORAL_TABLET | Freq: Once | ORAL | Status: AC
  Administered 2013-11-09: 1000 mg via ORAL
  Filled 2013-11-09: qty 4

## 2013-11-09 MED ORDER — METRONIDAZOLE 500 MG PO TABS: 500.0000 mg | ORAL_TABLET | Freq: Two times a day (BID) | ORAL | Status: DC

## 2013-11-09 MED ORDER — CEFTRIAXONE SODIUM 250 MG IJ SOLR
250.0000 mg | Freq: Once | INTRAMUSCULAR | Status: AC
  Administered 2013-11-09: 250 mg via INTRAMUSCULAR
  Filled 2013-11-09: qty 250

## 2013-11-09 MED ORDER — DOXYCYCLINE HYCLATE 100 MG PO CAPS: 100.0000 mg | ORAL_CAPSULE | Freq: Two times a day (BID) | ORAL | Status: DC

## 2013-11-09 NOTE — ED Notes (Signed)
Pt c/o productive cough x 3 days, coughing up green sputum. Pt also c/o bilateral ear pain.

## 2013-11-09 NOTE — ED Provider Notes (Signed)
CSN: [id number]     Arrival date & time 11/09/13  0998 History   First MD Initiated Contact with Patient 11/09/13 1005     Chief Complaint  Patient presents with  . Otalgia  . Cough     (Consider location/radiation/quality/duration/timing/severity/associated sxs/prior Treatment) HPI Comments: Pt states that she is having a productive cough, sore throat and earache times 3 days. Denies fever. Hasn't taken anything for the symptoms. Pt states that she is also having urinary frequency and some lower back pain. Denies abdominal pain or vaginal discharge  No language interpreter was used.    History reviewed. No pertinent past medical history. Past Surgical History  Procedure Laterality Date  . External ear surgery     No family history on file. History  Substance Use Topics  . Smoking status: Never Smoker   . Smokeless tobacco: Not on file  . Alcohol Use: Yes   OB History   Grav Para Term Preterm Abortions TAB SAB Ect Mult Living                 Review of Systems  Constitutional: Negative.   HENT: Positive for congestion.   Respiratory: Positive for cough.   Cardiovascular: Negative.       Allergies  Tramadol  Home Medications   Prior to Admission medications   Medication Sig Start Date End Date Taking? Authorizing Provider  ipratropium (ATROVENT) 0.03 % nasal spray Place 2 sprays into the nose 4 (four) times daily. 04/03/13   Arman Filter, NP   BP 137/111  Pulse 91  Temp(Src) 98.2 F (36.8 C) (Oral)  Resp 18  SpO2 100%  LMP 10/11/2013 Physical Exam  Nursing note and vitals reviewed. Constitutional: She is oriented to person, place, and time. She appears well-developed and well-nourished.  HENT:  Right Ear: External ear normal.  Left Ear: External ear normal.  Mouth/Throat: Posterior oropharyngeal erythema present.  Cardiovascular: Normal rate and regular rhythm.   Pulmonary/Chest: Effort normal and breath sounds normal.  Abdominal: Soft. Bowel sounds  are normal.  Musculoskeletal: Normal range of motion.  Neurological: She is alert and oriented to person, place, and time.  Skin: Skin is warm and dry.  Psychiatric: She has a normal mood and affect.    ED Course  Procedures (including critical care time) Labs Review Labs Reviewed  URINALYSIS, ROUTINE W REFLEX MICROSCOPIC - Abnormal; Notable for the following:    APPearance CLOUDY (*)    Leukocytes, UA LARGE (*)    All other components within normal limits  URINE CULTURE  PREGNANCY, URINE  URINE MICROSCOPIC-ADD ON    Imaging Review No results found.   EKG Interpretation None      MDM   Final diagnoses:  URI (upper respiratory infection)  Trichomonas infection    Discussed finding with pt and std prevention. Will treat for std. Discussed need for treatment of partner and follow up for continued symptoms    Teressa Lower, NP 11/09/13 1057

## 2013-11-09 NOTE — Discharge Instructions (Signed)
No sexual activity for the next 10 days. Your partner or partners need to be treated Upper Respiratory Infection, Adult An upper respiratory infection (URI) is also sometimes known as the common cold. The upper respiratory tract includes the nose, sinuses, throat, trachea, and bronchi. Bronchi are the airways leading to the lungs. Most people improve within 1 week, but symptoms can last up to 2 weeks. A residual cough may last even longer.  CAUSES Many different viruses can infect the tissues lining the upper respiratory tract. The tissues become irritated and inflamed and often become very moist. Mucus production is also common. A cold is contagious. You can easily spread the virus to others by oral contact. This includes kissing, sharing a glass, coughing, or sneezing. Touching your mouth or nose and then touching a surface, which is then touched by another person, can also spread the virus. SYMPTOMS  Symptoms typically develop 1 to 3 days after you come in contact with a cold virus. Symptoms vary from person to person. They may include:  Runny nose.  Sneezing.  Nasal congestion.  Sinus irritation.  Sore throat.  Loss of voice (laryngitis).  Cough.  Fatigue.  Muscle aches.  Loss of appetite.  Headache.  Low-grade fever. DIAGNOSIS  You might diagnose your own cold based on familiar symptoms, since most people get a cold 2 to 3 times a year. Your caregiver can confirm this based on your exam. Most importantly, your caregiver can check that your symptoms are not due to another disease such as strep throat, sinusitis, pneumonia, asthma, or epiglottitis. Blood tests, throat tests, and X-rays are not necessary to diagnose a common cold, but they may sometimes be helpful in excluding other more serious diseases. Your caregiver will decide if any further tests are required. RISKS AND COMPLICATIONS  You may be at risk for a more severe case of the common cold if you smoke cigarettes, have  chronic heart disease (such as heart failure) or lung disease (such as asthma), or if you have a weakened immune system. The very young and very old are also at risk for more serious infections. Bacterial sinusitis, middle ear infections, and bacterial pneumonia can complicate the common cold. The common cold can worsen asthma and chronic obstructive pulmonary disease (COPD). Sometimes, these complications can require emergency medical care and may be life-threatening. PREVENTION  The best way to protect against getting a cold is to practice good hygiene. Avoid oral or hand contact with people with cold symptoms. Wash your hands often if contact occurs. There is no clear evidence that vitamin C, vitamin E, echinacea, or exercise reduces the chance of developing a cold. However, it is always recommended to get plenty of rest and practice good nutrition. TREATMENT  Treatment is directed at relieving symptoms. There is no cure. Antibiotics are not effective, because the infection is caused by a virus, not by bacteria. Treatment may include:  Increased fluid intake. Sports drinks offer valuable electrolytes, sugars, and fluids.  Breathing heated mist or steam (vaporizer or shower).  Eating chicken soup or other clear broths, and maintaining good nutrition.  Getting plenty of rest.  Using gargles or lozenges for comfort.  Controlling fevers with ibuprofen or acetaminophen as directed by your caregiver.  Increasing usage of your inhaler if you have asthma. Zinc gel and zinc lozenges, taken in the first 24 hours of the common cold, can shorten the duration and lessen the severity of symptoms. Pain medicines may help with fever, muscle aches, and throat  pain. A variety of non-prescription medicines are available to treat congestion and runny nose. Your caregiver can make recommendations and may suggest nasal or lung inhalers for other symptoms.  HOME CARE INSTRUCTIONS   Only take over-the-counter or  prescription medicines for pain, discomfort, or fever as directed by your caregiver.  Use a warm mist humidifier or inhale steam from a shower to increase air moisture. This may keep secretions moist and make it easier to breathe.  Drink enough water and fluids to keep your urine clear or pale yellow.  Rest as needed.  Return to work when your temperature has returned to normal or as your caregiver advises. You may need to stay home longer to avoid infecting others. You can also use a face mask and careful hand washing to prevent spread of the virus. SEEK MEDICAL CARE IF:   After the first few days, you feel you are getting worse rather than better.  You need your caregiver's advice about medicines to control symptoms.  You develop chills, worsening shortness of breath, or brown or red sputum. These may be signs of pneumonia.  You develop yellow or brown nasal discharge or pain in the face, especially when you bend forward. These may be signs of sinusitis.  You develop a fever, swollen neck glands, pain with swallowing, or white areas in the back of your throat. These may be signs of strep throat. SEEK IMMEDIATE MEDICAL CARE IF:   You have a fever.  You develop severe or persistent headache, ear pain, sinus pain, or chest pain.  You develop wheezing, a prolonged cough, cough up blood, or have a change in your usual mucus (if you have chronic lung disease).  You develop sore muscles or a stiff neck. Document Released: 12/01/2000 Document Revised: 08/30/2011 Document Reviewed: 10/09/2010 [facility] Patient Information 2014 [place], [place].  Trichomoniasis Trichomoniasis is an infection, caused by the Trichomonas organism, that affects both women and men. In women, the outer female genitalia and the vagina are affected. In men, the penis is mainly affected, but the prostate and other reproductive organs can also be involved. Trichomoniasis is a sexually transmitted disease (STD) and is  most often passed to another person through sexual contact. The majority of people who get trichomoniasis do so from a sexual encounter and are also at risk for other STDs. CAUSES   Sexual intercourse with an infected partner.  It can be present in swimming pools or hot tubs. SYMPTOMS   Abnormal gray-green frothy vaginal discharge in women.  Vaginal itching and irritation in women.  Itching and irritation of the area outside the vagina in women.  Penile discharge with or without pain in males.  Inflammation of the urethra (urethritis), causing painful urination.  Bleeding after sexual intercourse. RELATED COMPLICATIONS  Pelvic inflammatory disease.  Infection of the uterus (endometritis).  Infertility.  Tubal (ectopic) pregnancy.  It can be associated with other STDs, including gonorrhea and chlamydia, hepatitis B, and HIV. COMPLICATIONS DURING PREGNANCY  Early (premature) delivery.  Premature rupture of the membranes (PROM).  Low birth weight. DIAGNOSIS   Visualization of Trichomonas under the microscope from the vagina discharge.  Ph of the vagina greater than 4.5, tested with a test tape.  Trich Rapid Test.  Culture of the organism, but this is not usually needed.  It may be found on a Pap test.  Having a "strawberry cervix,"which means the cervix looks very red like a strawberry. TREATMENT   You may be given medication to fight the  infection. Inform your caregiver if you could be or are pregnant. Some medications used to treat the infection should not be taken during pregnancy.  Over-the-counter medications or creams to decrease itching or irritation may be recommended.  Your sexual partner will need to be treated if infected. HOME CARE INSTRUCTIONS   Take all medication prescribed by your caregiver.  Take over-the-counter medication for itching or irritation as directed by your caregiver.  Do not have sexual intercourse while you have the  infection.  Do not douche or wear tampons.  Discuss your infection with your partner, as your partner may have acquired the infection from you. Or, your partner may have been the person who transmitted the infection to you.  Have your sex partner examined and treated if necessary.  Practice safe, informed, and protected sex.  See your caregiver for other STD testing. SEEK MEDICAL CARE IF:   You still have symptoms after you finish the medication.  You have an oral temperature above 102 F (38.9 C).  You develop belly (abdominal) pain.  You have pain when you urinate.  You have bleeding after sexual intercourse.  You develop a rash.  The medication makes you sick or makes you throw up (vomit). Document Released: 12/01/2000 Document Revised: 08/30/2011 Document Reviewed: 12/27/2008 [facility] Patient Information 2014 [place], [place].

## 2013-11-09 NOTE — ED Provider Notes (Signed)
Medical screening examination/treatment/procedure(s) were performed by non-physician practitioner and as supervising physician I was immediately available for consultation/collaboration.  Flint Melter, MD 11/09/13 [phone]

## 2013-11-10 LAB — URINE CULTURE

## 2013-12-02 ENCOUNTER — Encounter (HOSPITAL_COMMUNITY): Payer: Self-pay | Admitting: Emergency Medicine

## 2013-12-02 ENCOUNTER — Emergency Department (HOSPITAL_COMMUNITY)
Admission: EM | Admit: 2013-12-02 | Discharge: 2013-12-02 | Disposition: A | Discharge status: Home / Self Care | Payer: No Typology Code available for payment source | Attending: Emergency Medicine | Admitting: Emergency Medicine

## 2013-12-02 DIAGNOSIS — H9209 Otalgia, unspecified ear: Secondary | ICD-10-CM | POA: Insufficient documentation

## 2013-12-02 DIAGNOSIS — M7989 Other specified soft tissue disorders: Secondary | ICD-10-CM | POA: Insufficient documentation

## 2013-12-02 DIAGNOSIS — J029 Acute pharyngitis, unspecified: Secondary | ICD-10-CM | POA: Insufficient documentation

## 2013-12-02 DIAGNOSIS — M79609 Pain in unspecified limb: Secondary | ICD-10-CM

## 2013-12-02 DIAGNOSIS — R519 Headache, unspecified: Secondary | ICD-10-CM

## 2013-12-02 DIAGNOSIS — K029 Dental caries, unspecified: Secondary | ICD-10-CM | POA: Insufficient documentation

## 2013-12-02 DIAGNOSIS — R51 Headache: Secondary | ICD-10-CM

## 2013-12-02 MED ORDER — IBUPROFEN 600 MG PO TABS: 600.0000 mg | ORAL_TABLET | Freq: Three times a day (TID) | ORAL | Status: DC | PRN

## 2013-12-02 MED ORDER — IBUPROFEN 800 MG PO TABS
800.0000 mg | ORAL_TABLET | Freq: Once | ORAL | Status: AC
  Administered 2013-12-02: 800 mg via ORAL
  Filled 2013-12-02: qty 1

## 2013-12-02 MED ORDER — PENICILLIN V POTASSIUM 500 MG PO TABS
500.0000 mg | ORAL_TABLET | Freq: Once | ORAL | Status: AC
  Administered 2013-12-02: 500 mg via ORAL
  Filled 2013-12-02: qty 1

## 2013-12-02 MED ORDER — PENICILLIN V POTASSIUM 500 MG PO TABS: 500.0000 mg | ORAL_TABLET | Freq: Four times a day (QID) | ORAL | Status: AC

## 2013-12-02 MED ORDER — HYDROCODONE-ACETAMINOPHEN 5-325 MG PO TABS: 1.0000 | ORAL_TABLET | ORAL | Status: DC | PRN

## 2013-12-02 NOTE — ED Notes (Signed)
Pt reports  left calf swelling x4, noticeable larger than right calf, denies pain in leg. Pt reports sore throat x4 days with no pain in her right ear. Pain 8/10.

## 2013-12-02 NOTE — Progress Notes (Addendum)
VASCULAR LAB PRELIMINARY  PRELIMINARY  PRELIMINARY  PRELIMINARY  Left lower extremity venous duplex completed.    Preliminary report:  Left:  No obvious evidence of DVT, superficial thrombosis, or Baker's cyst. Technically limited due to body habitus  Jillian Reed, RVS 12/02/2013, 2:02 PM

## 2013-12-02 NOTE — ED Provider Notes (Signed)
CSN: [id number]     Arrival date & time 12/02/13  1143 History   First MD Initiated Contact with Patient 12/02/13 1158     Chief Complaint  Patient presents with  . Leg Swelling  . Sore Throat  . Otalgia      HPI Patient ports in crease seeing sore throat, right sided dental pain, right ear pain over the past 4 days.  Her pain is moderate in severity.  No difficulty breathing or swallowing.  No change in her voice.  No anterior neck pain.  No discharge from her ear.  No fevers or chills.  No chest pain or shortness of breath.  Denies abdominal pain.  She has no shortness of breath.  She does report some increasing left calf pain and swelling over the past several days.  No history of DVT or pulmonary embolism.   History reviewed. No pertinent past medical history. Past Surgical History  Procedure Laterality Date  . External ear surgery     History reviewed. No pertinent family history. History  Substance Use Topics  . Smoking status: Never Smoker   . Smokeless tobacco: Not on file  . Alcohol Use: Yes   OB History   Grav Para Term Preterm Abortions TAB SAB Ect Mult Living                 Review of Systems  All other systems reviewed and are negative.     Allergies  Tramadol  Home Medications   Prior to Admission medications   Medication Sig Start Date End Date Taking? Authorizing Provider  HYDROcodone-acetaminophen (NORCO/VICODIN) 5-325 MG per tablet Take 1 tablet by mouth every 4 (four) hours as needed for moderate pain. 12/02/13   Lyanne CoKevin M Kimmie Berggren, MD  ibuprofen (ADVIL,MOTRIN) 600 MG tablet Take 1 tablet (600 mg total) by mouth every 8 (eight) hours as needed. 12/02/13   Lyanne CoKevin M Tyrann Donaho, MD  penicillin v potassium (VEETID) 500 MG tablet Take 1 tablet (500 mg total) by mouth 4 (four) times daily. 12/02/13 12/09/13  Lyanne CoKevin M Kyarra Vancamp, MD   BP 148/52  Pulse 89  Temp(Src) 99.3 F (37.4 C) (Oral)  Resp 18  SpO2 100%  LMP 10/11/2013 Physical Exam  Nursing note and vitals  reviewed. Constitutional: She is oriented to person, place, and time. She appears well-developed and well-nourished. No distress.  HENT:  Head: Normocephalic and atraumatic.  Uvula midline.  Mild tonsillar and posterior pharyngeal erythema without exudate.  Tolerating secretions.  Oral airway patent.  Mild dental decay of her right upper second molar without gingival swelling or fluctuance.  Bilateral TMs are normal.  External auditory canals are normal.  Bilateral external ears are normal.  No cervical adenopathy noted  Eyes: EOM are normal.  Neck: Normal range of motion.  Cardiovascular: Normal rate, regular rhythm and normal heart sounds.   Pulmonary/Chest: Effort normal and breath sounds normal.  Abdominal: Soft. She exhibits no distension. There is no tenderness.  Musculoskeletal: Normal range of motion.  Morbidly obese lower extremities.  Left calf swelling as compared to right.  Normal PT and DP pulse in left foot.  Full range of motion of left ankle left knee.  No left mid thigh tenderness.  No left lower extremity erythema or warmth or focal tenderness  Neurological: She is alert and oriented to person, place, and time.  Skin: Skin is warm and dry.  Psychiatric: She has a normal mood and affect. Judgment normal.    ED Course  Procedures (including  critical care time) Labs Review Labs Reviewed - No data to display  Imaging Review No results found.   EKG Interpretation None      MDM   Final diagnoses:  Facial pain  Left leg swelling    Left lower extremity vascular duplex negative for DVT.  Will treat patient's throat ear and jaw pain with penicillins this may represent dental infection.  No obvious dental abscess.  No signs of peritonsillar abscess    Lyanne Co, MD 12/02/13 1441

## 2014-01-31 ENCOUNTER — Emergency Department (HOSPITAL_COMMUNITY)
Admission: EM | Admit: 2014-01-31 | Discharge: 2014-01-31 | Disposition: A | Discharge status: Home / Self Care | Payer: No Typology Code available for payment source | Attending: Emergency Medicine | Admitting: Emergency Medicine

## 2014-01-31 ENCOUNTER — Encounter (HOSPITAL_COMMUNITY): Payer: Self-pay | Admitting: Emergency Medicine

## 2014-01-31 DIAGNOSIS — K0889 Other specified disorders of teeth and supporting structures: Secondary | ICD-10-CM

## 2014-01-31 DIAGNOSIS — K029 Dental caries, unspecified: Secondary | ICD-10-CM | POA: Insufficient documentation

## 2014-01-31 DIAGNOSIS — K089 Disorder of teeth and supporting structures, unspecified: Secondary | ICD-10-CM | POA: Insufficient documentation

## 2014-01-31 MED ORDER — OXYCODONE-ACETAMINOPHEN 5-325 MG PO TABS: 1.0000 | ORAL_TABLET | Freq: Four times a day (QID) | ORAL | Status: DC | PRN

## 2014-01-31 MED ORDER — PENICILLIN V POTASSIUM 500 MG PO TABS: 500.0000 mg | ORAL_TABLET | Freq: Four times a day (QID) | ORAL | Status: AC

## 2014-01-31 MED ORDER — BUPIVACAINE-EPINEPHRINE (PF) 0.5% -1:200000 IJ SOLN
1.8000 mL | Freq: Once | INTRAMUSCULAR | Status: DC
  Filled 2014-01-31: qty 1.8

## 2014-01-31 NOTE — Discharge Instructions (Signed)
Dental Pain °A tooth ache may be caused by cavities (tooth decay). Cavities expose the nerve of the tooth to air and hot or cold temperatures. It may come from an infection or abscess (also called a boil or furuncle) around your tooth. It is also often caused by dental caries (tooth decay). This causes the pain you are having. °DIAGNOSIS  °Your caregiver can diagnose this problem by exam. °TREATMENT  °· If caused by an infection, it may be treated with medications which kill germs (antibiotics) and pain medications as prescribed by your caregiver. Take medications as directed. °· Only take over-the-counter or prescription medicines for pain, discomfort, or fever as directed by your caregiver. °· Whether the tooth ache today is caused by infection or dental disease, you should see your dentist as soon as possible for further care. °SEEK MEDICAL CARE IF: °The exam and treatment you received today has been provided on an emergency basis only. This is not a substitute for complete medical or dental care. If your problem worsens or new problems (symptoms) appear, and you are unable to meet with your dentist, call or return to this location. °SEEK IMMEDIATE MEDICAL CARE IF:  °· You have a fever. °· You develop redness and swelling of your face, jaw, or neck. °· You are unable to open your mouth. °· You have severe pain uncontrolled by pain medicine. °MAKE SURE YOU:  °· Understand these instructions. °· Will watch your condition. °· Will get help right away if you are not doing well or get worse. °Document Released: 06/07/2005 Document Revised: 08/30/2011 Document Reviewed: 01/24/2008 °ExitCare® Patient Information ©2015 [employer]. This information is not intended to replace advice given to you by your health care provider. Make sure you discuss any questions you have with your health care provider. ° °Emergency Department Resource Guide °1) Find a Doctor and Pay Out of Pocket °Although you won't have to find out who  is covered by your insurance plan, it is a good idea to ask around and get recommendations. You will then need to call the office and see if the doctor you have chosen will accept you as a new patient and what types of options they offer for patients who are self-pay. Some doctors offer discounts or will set up payment plans for their patients who do not have insurance, but you will need to ask so you aren't surprised when you get to your appointment. ° °2) Contact Your Local Health Department °Not all health departments have doctors that can see patients for sick visits, but many do, so it is worth a call to see if yours does. If you don't know where your local health department is, you can check in your phone book. The CDC also has a tool to help you locate your state's health department, and many state websites also have listings of all of their local health departments. ° °3) Find a Walk-in Clinic °If your illness is not likely to be very severe or complicated, you may want to try a walk in clinic. These are popping up all over the country in pharmacies, drugstores, and shopping centers. They're usually staffed by nurse practitioners or physician assistants that have been trained to treat common illnesses and complaints. They're usually fairly quick and inexpensive. However, if you have serious medical issues or chronic medical problems, these are probably not your best option. ° °No Primary Care Doctor: °- Call [facility] at  832-8000 - they can help you locate a primary   care doctor that  accepts your insurance, provides certain services, etc. °- Physician Referral Service- 1-800-533-3463 ° °Chronic Pain Problems: °Organization         Address  Phone   Notes  °[facility]  (336) 297-2271 Patients need to be referred by their primary care doctor.  ° °Medication Assistance: °Organization         Address  Phone   Notes  °[place] [facility] [address]  [address] °[place], [place] [address] (336) 641-8030 --Must be a resident of [place] °-- Must have NO insurance coverage whatsoever (no Medicaid/ Medicare, etc.) °-- The pt. MUST have a primary care doctor that directs their care regularly and follows them in the community °  °[facility]  (866) 331-1348   °[employer]  (888) 892-1162   ° °Agencies that provide inexpensive medical care: °Organization         Address  Phone   Notes  °[facility]  (336) 832-8035   °[facility]    (336) 832-7272   °[facility] [address] °[place], [place] [address] (336) 832-4777   °[facility] [address], °[place] (336) 271-4999   °[facility]    (336) 373-0678   °[facility]    (336) 272-1050   °[facility] ° [address], [place] Phone:  (336) 832-4444, Fax:  (336) 832-4440 Hours of Operation:  9 am - 6 pm, M-F.  Also accepts Medicaid/Medicare and self-pay.  °[facility] ° [address], [place] Phone: (336) 832-3150, Fax: (336) 832-3151. Hours of Operation:  8:30 am - 5:30 pm, M-F.  Also accepts Medicaid and self-pay.  °[facility] [place] [address], [place] Phone: (336) 878-6027   °[facility] [address], [place], [place] (336)723-1848, Ext. 123 Mondays & Thursdays: 7-9 AM.  First 15 patients are seen on a first come, first serve basis. °  ° °Medicaid-accepting [place] Providers: ° °Organization         Address  Phone   Notes  °[facility] [address], [place] (336) 641-2100 Also accepts self-pay patients.  °[facility] [address], [place] ° (336) 856-9996   °[facility] [address], [place] (336) 288-8857   °[facility] [address] [place] [address], [place] (336) 299-7000   °Veita Bland [address], [place]  ° (336) 373-1557 Only accepts [place] Access Medicaid patients after they have their name applied to their card.  ° °Self-Pay (no insurance) in [place]: ° °Organization         Address  Phone   Notes  °Sickle Cell Patients, [facility] [address], [place] (336) 832-1970   °[facility] [address], [place] (336) 832-4400   °[facility] ° [address], [place] (336) 992-4800   °[facility]/Dr. Osei-Bonsu ° [address] [place] [address], [place] or [address], [place] (336) 841-8500 Phone number for both [place] and [place] locations is the same.  °[facility] [address], [place] (336) 299-0000   °[facility] [address] [place] [address],  or [address] (336) 852-7530 °(336) 878-2260   °  [facility] [address], [place] (336) 350-1642, phone; (336) 294-5005, fax Sees patients 1st and 3rd Saturday of every month.  Must not qualify for public or private insurance (i.e. Medicaid, Medicare, [place] Health Choice, Veterans' Benefits) • Household income should be no more than 200% of the poverty level •The clinic cannot treat you if you are pregnant or think you are pregnant • Sexually transmitted diseases are not treated at the clinic.  ° ° °Dental Care: °Organization         Address  Phone  Notes  °[facility] [facility] [address], [place] (336) 641-6152 Accepts children up to age 21 who are enrolled in Medicaid or [place] Health Choice; pregnant women with a Medicaid card; and children who have applied for Medicaid or [place] Health Choice, but were declined, whose parents can pay a reduced fee at time of service.  °[facility] [place]  [address], [place] (336) 641-7733 Accepts children up to age 21 who are  enrolled in Medicaid or [place] Health Choice; pregnant women with a Medicaid card; and children who have applied for Medicaid or [place] Health Choice, but were declined, whose parents can pay a reduced fee at time of service.  °[facility] ° [address], [place] (336) 641-4533 Patients are seen by appointment only. Walk-ins are not accepted. [facility] will see patients 18 years of age and older. °Monday - Tuesday (8am-5pm) °Most Wednesdays (8:30-5pm) °$30 per visit, cash only  °[facility] ° [address], [place] (336) 641-4533 Patients are seen by appointment only. Walk-ins are not accepted. [facility] will see patients 18 years of age and older. °One Wednesday Evening (Monthly: Volunteer Based).  $30 per visit, cash only  °[facility]  (919) 537-3737 for adults; Children under age 4, call Graduate Pediatric Dentistry at (919) [phone]. Children aged 4-14, please call (919) 537-3737 to request a pediatric application. ° Dental services are provided in all areas of dental care including fillings, crowns and bridges, complete and partial dentures, implants, gum treatment, root canals, and extractions. Preventive care is also provided. Treatment is provided to both adults and children. °Patients are selected via a lottery and there is often a waiting list. °  °[facility] [address], °[place] ° (336) 763-8833 [email or web address] °  °[facility] [address], [place], [place] (336)723-1848, Ext. 123 Second and Fourth Thursday of each month, opens at 6:30 AM; Clinic ends at 9 AM.  Patients are seen on a first-come first-served basis, and a limited number are seen during each clinic.  ° °[facility] ° [address], [place], [place] (336) 723-7904   Eligibility Requirements °You must have lived in [place], [place], or [place] counties for at least the last three months. °  You  cannot be eligible for state or federal sponsored healthcare insurance, including Veterans Administration, Medicaid, or Medicare. °  You generally cannot be eligible for healthcare insurance through your employer.  °  How to apply: °Eligibility screenings are held every Tuesday and Wednesday afternoon from 1:00 pm until 4:00 pm. You do not need an appointment for the interview!  °[facility] [address], [place], [place] 336-631-2330   °[facility]  336-342-8273   °[facility]  336-703-3100   °[facility]  336-570-6415   ° °Behavioral Health Resources in the Community: °Intensive Outpatient Programs °Organization         Address  Phone  Notes  °[facility] [address], [place], [place] 336-878-6098   °[facility] [address], [place], [place] 336-832-9800   °[facility] [address], [place], [place] ° 336-882-2125   °[facility] [address],  °[place], [place] 1-800-853-5163 or 336-641-4981   °Substance Abuse Resources °Organization         Address  Phone  Notes  °[facility]  336-882-2125   °[facility]  336-784-9470   °[facility]  336-285-9073   °[facility]  336-845-3988   °Residential & Outpatient Substance Abuse Program  1-800-659-3381   °Psychological Services °Organization         Address  Phone  Notes  °[facility]  336- 832-9600   °[facility]  336- 378-7881   °[facility] [address], [place] 1-800-853-5163 or 336-641-4981   ° °Mobile Crisis Teams °Organization         Address  Phone  Notes  °[facility], Mobile Crisis Care Unit  1-877-626-1772   °Assertive °[facility] ° [address] [place], [place] 336-834-9664   °Sharon DeEsch [address] °[place] 336-554-5454   ° °Self-Help/Support  Groups °Organization         Address  Phone             Notes  °[facility] [place] - variety of support groups  336- 373-1402 Call for more information  °Narcotics Anonymous (NA), [facility] [address], °[place]  2 meetings at this location  ° °Residential Treatment Programs °Organization         Address  Phone  Notes  °[facility] [address],    °[place]  1-866-801-8205   °[facility] ° [address], [place], [place] 704-293-8524   °[facility] [address], [place] 336-845-3988 Admissions: 8am-3pm M-F  °[facility] [address]    °[place], [place] 336-841-1104   °[facility] [address], [place], [place] 336-379-7146   °[facility] [address]  °[place], [place] 336-285-9073   °[facility] Intensive Outpatient [address], [place], [place] 336-852-3033   °[facility] [address]  °[place], [place] 1-877-615-2722 or 336-784-9470   °[facility] [address] [place], [place] 336-227-7417 Accepts Medicaid  °[facility] [address]  ° [place] 1-800-659-3381 Substance Abuse/Addiction Treatment  ° °[place] Behavioral Health Resources °Organization         Address  Phone  Notes  °[facility]  (888) 581-9988   °Julie Brannon, PhD [address] [place], [place]   (336) 349-5553 or (336) 951-0000   °[facility]   [address] °[place], [place] (336) 349-4454   °[facility] [address], [place], [place] (336) 342-8316 Insurance/Medicaid/sponsorship through [facility]  °[facility] [address]                                    [place], [place] (336) 342-8316 Therapy/tele-psych/case  °[facility]   [address]  ° [place], [place] (336) 349-2233    °Dr. Arfeen  (336) 349-4544   °[facility] [place]  [facility]  [employer] [facility] [place] [facility] 1) [address], [place] °2) [address], [place] °3)  [address], [place] (336) 349-3220 °(336) 342-7768 ° °(336) 342-8140   °[place] [facility] (336) 342-1394 or (336) 342-3537 (After Hours)    ° ° ° °

## 2014-01-31 NOTE — ED Notes (Signed)
Pt c/o dental pain to L upper molar area for about a week. Pt has no dentist and would like a referral. Pt has no acute distress.

## 2014-01-31 NOTE — ED Provider Notes (Signed)
CSN: [id number]     Arrival date & time 01/31/14  2210 History  This chart was scribed for non-physician practitioner working with Ward GivensIva L Knapp, MD by Elveria Risingimelie Horne, ED Scribe. This patient was seen in room WTR8/WTR8 and the patient's care was started at 10:46 PM.   Chief Complaint  Patient presents with  . Dental Pain    The history is provided by the patient. No language interpreter was used.   HPI Comments: Jillian Reed is a 33 y.o. female who presents to the Emergency Department complaining of sharp left upper dental pain, ongoing for one week. Patient reports past issues with the tooth. Attempted treatment with Vicodin and ibuprofen, without relief. Patient denies trauma/injury to her mouth, pus drainage, inability to swallow, facial swelling, oral bleeding, or fever. Patient does not have dentist.   History reviewed. No pertinent past medical history. Past Surgical History  Procedure Laterality Date  . External ear surgery     No family history on file. History  Substance Use Topics  . Smoking status: Never Smoker   . Smokeless tobacco: Not on file  . Alcohol Use: Yes   OB History   Grav Para Term Preterm Abortions TAB SAB Ect Mult Living                  Review of Systems  Constitutional: Negative for fever and chills.  HENT: Positive for dental problem. Negative for drooling, sore throat and trouble swallowing.     Allergies  Tramadol  Home Medications   Prior to Admission medications   Medication Sig Start Date End Date Taking? Authorizing Provider  HYDROcodone-acetaminophen (NORCO/VICODIN) 5-325 MG per tablet Take 1 tablet by mouth every 6 (six) hours as needed for moderate pain.   Yes Historical Provider, MD  oxyCODONE-acetaminophen (PERCOCET/ROXICET) 5-325 MG per tablet Take 1 tablet by mouth every 6 (six) hours as needed for moderate pain or severe pain. 01/31/14   Antony MaduraKelly Glynnis Gavel, PA-C  penicillin v potassium (VEETID) 500 MG tablet Take 1 tablet (500 mg  total) by mouth 4 (four) times daily. 01/31/14 02/07/14  Antony MaduraKelly Meli Faley, PA-C   BP 135/87  Pulse 92  Temp(Src) 98.7 F (37.1 C) (Oral)  Resp 16  SpO2 100%  Physical Exam  Nursing note and vitals reviewed. Constitutional: She is oriented to person, place, and time. She appears well-developed and well-nourished. No distress.  Nontoxic/nonseptic appearing  HENT:  Head: Normocephalic and atraumatic.  Mouth/Throat: Uvula is midline, oropharynx is clear and moist and mucous membranes are normal. No oral lesions. No trismus in the jaw. Abnormal dentition. Dental caries present. No dental abscesses or uvula swelling.    Uvula midline. Oropharynx clear. Patient tolerating secretions without difficulty. No gingival swelling or fluctuance. No trismus or stridor.  Eyes: Conjunctivae and EOM are normal. No scleral icterus.  Neck: Normal range of motion. Neck supple.  No nuchal rigidity or meningismus  Pulmonary/Chest: Effort normal. No respiratory distress.  No tachypnea or dyspnea. Chest expansion symmetric  Musculoskeletal: Normal range of motion.  Neurological: She is alert and oriented to person, place, and time. She exhibits normal muscle tone. Coordination normal.  Skin: Skin is warm and dry. No rash noted. She is not diaphoretic. No erythema. No pallor.  Psychiatric: She has a normal mood and affect. Her behavior is normal.    ED Course  Dental Date/Time: 01/31/2014 11:30 PM Performed by: Antony MaduraHUMES, Nareh Matzke Authorized by: Antony MaduraHUMES, Chanel Mckesson Consent: Verbal consent obtained. written consent not obtained. The procedure was performed  in an emergent situation. Risks and benefits: risks, benefits and alternatives were discussed Consent given by: patient Patient understanding: patient states understanding of the procedure being performed Patient consent: the patient's understanding of the procedure matches consent given Procedure consent: procedure consent matches procedure scheduled Relevant documents:  relevant documents present and verified Test results: test results available and properly labeled Site marked: the operative site was marked Imaging studies: imaging studies available Required items: required blood products, implants, devices, and special equipment available Patient identity confirmed: verbally with patient and arm band Time out: Immediately prior to procedure a "time out" was called to verify the correct patient, procedure, equipment, support staff and site/side marked as required. Local anesthesia used: yes Anesthesia: local infiltration Local anesthetic: bupivacaine 0.5% with epinephrine Anesthetic total: 1 ml Patient sedated: no Patient tolerance: Patient tolerated the procedure well with no immediate complications. Comments: Patient with relief of dentalgia from local infiltration of bupivacaine.   (including critical care time)  COORDINATION OF CARE: 10:49 PM- Will prescribe antibiotic. Discussed treatment plan with patient at bedside and patient agreed to plan.   Labs Review Labs Reviewed - No data to display  Imaging Review No results found.   EKG Interpretation None      MDM   Final diagnoses:  Dentalgia    Patient with toothache. No gross abscess. Exam unconcerning for Ludwig's angina or spread of infection. Will treat with penicillin and pain medicine. Urged patient to follow-up with dentist. Return precautions provided and patient agreeable to plan with no unaddressed concerns.  I personally performed the services described in this documentation, which was scribed in my presence. The recorded information has been reviewed and is accurate.   Filed Vitals:   01/31/14 2220  BP: 135/87  Pulse: 92  Temp: 98.7 F (37.1 C)  TempSrc: Oral  Resp: 16  SpO2: 100%     Antony Madura, PA-C 01/31/14 2332

## 2014-02-01 NOTE — ED Provider Notes (Signed)
Medical screening examination/treatment/procedure(s) were performed by non-physician practitioner and as supervising physician I was immediately available for consultation/collaboration.   EKG Interpretation None      Devoria Albe, MD, Armando Gang   Ward Givens, MD 02/01/14 [phone]

## 2014-03-01 ENCOUNTER — Ambulatory Visit (INDEPENDENT_AMBULATORY_CARE_PROVIDER_SITE_OTHER): Payer: No Typology Code available for payment source | Admitting: Family Medicine

## 2014-03-01 VITALS — BP 110/70 | HR 98 | Temp 97.7°F | Resp 18 | Ht 67.0 in | Wt >= 6400 oz

## 2014-03-01 DIAGNOSIS — Z3200 Encounter for pregnancy test, result unknown: Secondary | ICD-10-CM

## 2014-03-01 LAB — POCT URINE PREGNANCY: Preg Test, Ur: POSITIVE

## 2014-03-01 NOTE — Progress Notes (Signed)
[facility] [address] [place] [address] [phone]  Date:  03/01/2014   Name:  Jillian Reed   DOB:  Jul 12, 1980   MRN:  [id number]  PCP:  Jillian Lewandowsky, MD    Chief Complaint: Pregnancy Test   History of Present Illness:  Jillian Reed is a 33 y.o. very pleasant female patient who presents with the following:  She is here today seeking an HCG test.  She states she had "two periods last month," noted bleeding early and mid August, nothing since then.  She took 2 home HCG today that were positive.   She has not had any other bleeding or LOF, she does have occasional "muscle cramps" in her abdomen, back, and buttocks but these do not last  This is her first pregnancy.  She is excited about this news.  She does have a 33 year old adopted daughter.   She does not yet have an OB-GYN.  She is a non- smoker.   She has noted nausea but no vomiting.   She has started PNV.     Patient Active Problem List   Diagnosis Date Noted  . Preventative health care 03/05/2013  . Cough 03/05/2013    History reviewed. No pertinent past medical history.  Past Surgical History  Procedure Laterality Date  . External ear surgery      History  Substance Use Topics  . Smoking status: Never Smoker   . Smokeless tobacco: Not on file  . Alcohol Use: Yes    No family history on file.  Allergies  Allergen Reactions  . Percocet [Oxycodone-Acetaminophen]   . Tramadol     paranoid    Medication list has been reviewed and updated.  Current Outpatient Prescriptions on File Prior to Visit  Medication Sig Dispense Refill  . HYDROcodone-acetaminophen (NORCO/VICODIN) 5-325 MG per tablet Take 1 tablet by mouth every 6 (six) hours as needed for moderate pain.      Marland Kitchen oxyCODONE-acetaminophen (PERCOCET/ROXICET) 5-325 MG per tablet Take 1 tablet by mouth every 6 (six) hours as needed for moderate pain or severe pain.  13 tablet  0   No current  facility-administered medications on file prior to visit.    Review of Systems:  As per HPI- otherwise negative.   Physical Examination: Filed Vitals:   03/01/14 1425  Pulse: 98  Temp: 97.7 F (36.5 C)  Resp: 18   Filed Vitals:   03/01/14 1425  Height: 5\' 7"  (1.702 m)  Weight: 400 lb (181.439 kg)   Body mass index is 62.63 kg/(m^2). Ideal Body Weight: Weight in (lb) to have BMI = 25: 159.3  GEN: WDWN, NAD, Non-toxic, A & O x 3, morbid obesity  HEENT: Atraumatic, Normocephalic. Neck supple. No masses, No LAD. Ears and Nose: No external deformity. CV: RRR, No M/G/R. No JVD. No thrill. No extra heart sounds. PULM: CTA B, no wheezes, crackles, rhonchi. No retractions. No resp. distress. No accessory muscle use. ABD: S, NT, ND, +BS. No rebound. No HSM.  Benign exam EXTR: No c/c/e NEURO Normal gait.  PSYCH: Normally interactive. Conversant. Not depressed or anxious appearing.  Calm demeanor.    Results for orders placed in visit on 03/01/14  POCT URINE PREGNANCY      Result Value Ref Range   Preg Test, Ur Positive      Assessment and Plan: Encounter for pregnancy test - Plan: POCT urine pregnancy, Ambulatory referral to Obstetrics / Gynecology  Diagnosis of pregnancy today.  Encouraged PNV, avoid smoking, alcohol and trauma. Her dates are somewhat uncertain, but suspect her early August bleeding was her period and the mid august bleeding was from implantation.    Signed Abbe Amsterdam, MD

## 2014-03-01 NOTE — Patient Instructions (Signed)
congratulations to you!  You are likely about [redacted] weeks along.  We will refer you to OB-GYN for further care.  Continue to take pre- natal vitamins

## 2014-03-06 ENCOUNTER — Ambulatory Visit (INDEPENDENT_AMBULATORY_CARE_PROVIDER_SITE_OTHER): Payer: No Typology Code available for payment source | Admitting: Emergency Medicine

## 2014-03-06 VITALS — BP 116/76 | HR 80 | Temp 98.5°F | Resp 18 | Wt >= 6400 oz

## 2014-03-06 DIAGNOSIS — Z331 Pregnant state, incidental: Secondary | ICD-10-CM

## 2014-03-06 DIAGNOSIS — N3000 Acute cystitis without hematuria: Secondary | ICD-10-CM

## 2014-03-06 DIAGNOSIS — R109 Unspecified abdominal pain: Secondary | ICD-10-CM

## 2014-03-06 LAB — POCT UA - MICROSCOPIC ONLY
CASTS, UR, LPF, POC: NEGATIVE
Crystals, Ur, HPF, POC: NEGATIVE
MUCUS UA: NEGATIVE
Yeast, UA: NEGATIVE

## 2014-03-06 LAB — POCT URINALYSIS DIPSTICK
Bilirubin, UA: NEGATIVE
Blood, UA: NEGATIVE
Glucose, UA: NEGATIVE
KETONES UA: NEGATIVE
Nitrite, UA: NEGATIVE
PH UA: 7
PROTEIN UA: NEGATIVE
Spec Grav, UA: 1.02
Urobilinogen, UA: 0.2

## 2014-03-06 MED ORDER — NITROFURANTOIN MONOHYD MACRO 100 MG PO CAPS: 100.0000 mg | ORAL_CAPSULE | Freq: Two times a day (BID) | ORAL | Status: DC

## 2014-03-06 NOTE — Progress Notes (Signed)
[facility] [address], [place] [address] [phone]  Date:  03/06/2014   Name:  Jillian Reed   DOB:  02-23-1981   MRN:  [id number]  PCP:  Jeanann Lewandowsky, MD    Chief Complaint: Sore Throat and Urinary Tract Infection   History of Present Illness:  Jillian Reed is a 33 y.o. very pleasant female patient who presents with the following:  Just found to be [redacted] weeks pregnant.  Has dysuria and frequency.  No fever or chills, nausea or vomiting.  No hypertension, vaginal bleeding or dyspareunia Has a sore throat. No cough or coryza. No stool change or rash. No improvement with over the counter medications or other home remedies.  Denies other complaint or health concern today.   Patient Active Problem List   Diagnosis Date Noted  . Preventative health care 03/05/2013  . Cough 03/05/2013    No past medical history on file.  Past Surgical History  Procedure Laterality Date  . External ear surgery      History  Substance Use Topics  . Smoking status: Never Smoker   . Smokeless tobacco: Not on file  . Alcohol Use: Yes    No family history on file.  Allergies  Allergen Reactions  . Percocet [Oxycodone-Acetaminophen]   . Tramadol     paranoid    Medication list has been reviewed and updated.  Current Outpatient Prescriptions on File Prior to Visit  Medication Sig Dispense Refill  . HYDROcodone-acetaminophen (NORCO/VICODIN) 5-325 MG per tablet Take 1 tablet by mouth every 6 (six) hours as needed for moderate pain.      Marland Kitchen oxyCODONE-acetaminophen (PERCOCET/ROXICET) 5-325 MG per tablet Take 1 tablet by mouth every 6 (six) hours as needed for moderate pain or severe pain.  13 tablet  0   No current facility-administered medications on file prior to visit.    Review of Systems:  As per HPI, otherwise negative.    Physical Examination: Filed Vitals:   03/06/14 1507  BP: 116/76  Pulse: 80  Temp: 98.5 F (36.9 C)  Resp:  18   Filed Vitals:   03/06/14 1507  Weight: 402 lb (182.346 kg)   Body mass index is 62.95 kg/(m^2). Ideal Body Weight:    GEN: morbid obesity, NAD, Non-toxic, A & O x 3 HEENT: Atraumatic, Normocephalic. Neck supple. No masses, No LAD. Ears and Nose: No external deformity. CV: RRR, No M/G/R. No JVD. No thrill. No extra heart sounds. PULM: CTA B, no wheezes, crackles, rhonchi. No retractions. No resp. distress. No accessory muscle use. ABD: S, NT, ND, +BS. No rebound. No HSM. EXTR: No c/c/e NEURO Normal gait.  PSYCH: Normally interactive. Conversant. Not depressed or anxious appearing.  Calm demeanor.    Assessment and Plan: Cystitis Pregnancy Morbid obesity macrobid   Signed,  Phillips Odor, MD  Results for orders placed in visit on 03/06/14  POCT URINALYSIS DIPSTICK      Result Value Ref Range   Color, UA yellow     Clarity, UA hazy     Glucose, UA neg     Bilirubin, UA neg     Ketones, UA neg     Spec Grav, UA 1.020     Blood, UA neg     pH, UA 7.0     Protein, UA neg     Urobilinogen, UA 0.2     Nitrite, UA neg     Leukocytes, UA large (3+)    POCT UA -  MICROSCOPIC ONLY      Result Value Ref Range   WBC, Ur, HPF, POC 4-6     RBC, urine, microscopic 0-1     Bacteria, U Microscopic trace     Mucus, UA neg     Epithelial cells, urine per micros 4-8     Crystals, Ur, HPF, POC neg     Casts, Ur, LPF, POC neg     Yeast, UA neg

## 2014-03-06 NOTE — Patient Instructions (Signed)
Pregnancy and Urinary Tract Infection °A urinary tract infection (UTI) is a bacterial infection of the urinary tract. Infection of the urinary tract can include the ureters, kidneys (pyelonephritis), bladder (cystitis), and urethra (urethritis). All pregnant women should be screened for bacteria in the urinary tract. Identifying and treating a UTI will decrease the risk of preterm labor and developing more serious infections in both the mother and baby. °CAUSES °Bacteria germs cause almost all UTIs.  °RISK FACTORS °Many factors can increase your chances of getting a UTI during pregnancy. These include: °· Having a short urethra. °· Poor toilet and hygiene habits. °· Sexual intercourse. °· Blockage of urine along the urinary tract. °· Problems with the pelvic muscles or nerves. °· Diabetes. °· Obesity. °· Bladder problems after having several children. °· Previous history of UTI. °SIGNS AND SYMPTOMS  °· Pain, burning, or a stinging feeling when urinating. °· Suddenly feeling the need to urinate right away (urgency). °· Loss of bladder control (urinary incontinence). °· Frequent urination, more than is common with pregnancy. °· Lower abdominal or back discomfort. °· Cloudy urine. °· Blood in the urine (hematuria). °· Fever.  °When the kidneys are infected, the symptoms may be: °· Back pain. °· Flank pain on the right side more so than the left. °· Fever. °· Chills. °· Nausea. °· Vomiting. °DIAGNOSIS  °A urinary tract infection is usually diagnosed through urine tests. Additional tests and procedures are sometimes done. These may include: °· Ultrasound exam of the kidneys, ureters, bladder, and urethra. °· Looking in the bladder with a lighted tube (cystoscopy). °TREATMENT °Typically, UTIs can be treated with antibiotic medicines.  °HOME CARE INSTRUCTIONS  °· Only take over-the-counter or prescription medicines as directed by your health care provider. If you were prescribed antibiotics, take them as directed. Finish  them even if you start to feel better. °· Drink enough fluids to keep your urine clear or pale yellow. °· Do not have sexual intercourse until the infection is gone and your health care provider says it is okay. °· Make sure you are tested for UTIs throughout your pregnancy. These infections often come back.  °Preventing a UTI in the Future °· Practice good toilet habits. Always wipe from front to back. Use the tissue only once. °· Do not hold your urine. Empty your bladder as soon as possible when the urge comes. °· Do not douche or use deodorant sprays. °· Wash with soap and warm water around the genital area and the anus. °· Empty your bladder before and after sexual intercourse. °· Wear underwear with a cotton crotch. °· Avoid caffeine and carbonated drinks. They can irritate the bladder. °· Drink cranberry juice or take cranberry pills. This may decrease the risk of getting a UTI. °· Do not drink alcohol. °· Keep all your appointments and tests as scheduled.  °SEEK MEDICAL CARE IF:  °· Your symptoms get worse. °· You are still having fevers 2 or more days after treatment begins. °· You have a rash. °· You feel that you are having problems with medicines prescribed. °· You have abnormal vaginal discharge. °SEEK IMMEDIATE MEDICAL CARE IF:  °· You have back or flank pain. °· You have chills. °· You have blood in your urine. °· You have nausea and vomiting. °· You have contractions of your uterus. °· You have a gush of fluid from the vagina. °MAKE SURE YOU: °· Understand these instructions.   °· Will watch your condition.   °· Will get help right away if you are not doing   well or get worse.   °Document Released: 10/02/2010 Document Revised: 03/28/2013 Document Reviewed: 01/04/2013 °ExitCare® Patient Information ©2015 [employer]. This information is not intended to replace advice given to you by your health care provider. Make sure you discuss any questions you have with your health care provider. ° °

## 2014-04-15 LAB — OB RESULTS CONSOLE HGB/HCT, BLOOD
HCT: 35 %
HEMOGLOBIN: 10.9 g/dL

## 2014-04-15 LAB — CYTOLOGY - PAP: PAP SMEAR: NEGATIVE

## 2014-04-15 LAB — OB RESULTS CONSOLE RPR: RPR: NONREACTIVE

## 2014-04-15 LAB — OB RESULTS CONSOLE HEPATITIS B SURFACE ANTIGEN: HEP B S AG: NEGATIVE

## 2014-04-15 LAB — SICKLE CELL SCREEN: Sickle Cell Screen: NORMAL

## 2014-04-15 LAB — CULTURE, OB URINE: Urine Culture, OB: NEGATIVE

## 2014-04-15 LAB — OB RESULTS CONSOLE ABO/RH: RH TYPE: POSITIVE

## 2014-04-15 LAB — OB RESULTS CONSOLE GC/CHLAMYDIA
CHLAMYDIA, DNA PROBE: NEGATIVE
Gonorrhea: NEGATIVE

## 2014-04-15 LAB — OB RESULTS CONSOLE RUBELLA ANTIBODY, IGM: RUBELLA: IMMUNE

## 2014-04-15 LAB — CYSTIC FIBROSIS DIAGNOSTIC STUDY: Interpretation-CFDNA:: NEGATIVE

## 2014-04-15 LAB — OB RESULTS CONSOLE ANTIBODY SCREEN: Antibody Screen: NEGATIVE

## 2014-04-15 LAB — DRUG SCREEN, URINE: DRUG SCREEN, URINE: NEGATIVE

## 2014-04-15 LAB — OB RESULTS CONSOLE VARICELLA ZOSTER ANTIBODY, IGG: VARICELLA IGG: IMMUNE

## 2014-04-15 LAB — GLUCOSE TOLERANCE, 1 HOUR (50G) W/O FASTING: GLUCOSE 1 HOUR GTT: 120 mg/dL (ref ?–200)

## 2014-04-15 LAB — OB RESULTS CONSOLE HIV ANTIBODY (ROUTINE TESTING): HIV: NONREACTIVE

## 2014-04-16 ENCOUNTER — Other Ambulatory Visit (HOSPITAL_COMMUNITY): Payer: Self-pay | Admitting: Nurse Practitioner

## 2014-04-16 DIAGNOSIS — Z3682 Encounter for antenatal screening for nuchal translucency: Secondary | ICD-10-CM

## 2014-04-16 DIAGNOSIS — O3680X1 Pregnancy with inconclusive fetal viability, fetus 1: Secondary | ICD-10-CM

## 2014-04-16 DIAGNOSIS — O26841 Uterine size-date discrepancy, first trimester: Secondary | ICD-10-CM

## 2014-04-26 ENCOUNTER — Ambulatory Visit (HOSPITAL_COMMUNITY): Admission: RE | Admit: 2014-04-26 | Payer: No Typology Code available for payment source | Source: Ambulatory Visit

## 2014-04-26 ENCOUNTER — Other Ambulatory Visit (HOSPITAL_COMMUNITY): Payer: Self-pay | Admitting: Maternal and Fetal Medicine

## 2014-04-26 ENCOUNTER — Encounter (HOSPITAL_COMMUNITY): Payer: Self-pay

## 2014-04-26 ENCOUNTER — Ambulatory Visit (HOSPITAL_COMMUNITY)
Admission: RE | Admit: 2014-04-26 | Discharge: 2014-04-26 | Disposition: A | Discharge status: Home / Self Care | Payer: No Typology Code available for payment source | Source: Ambulatory Visit | Attending: Nurse Practitioner | Admitting: Nurse Practitioner

## 2014-04-26 VITALS — BP 154/79 | HR 87 | Wt 389.0 lb

## 2014-04-26 DIAGNOSIS — O3680X0 Pregnancy with inconclusive fetal viability, not applicable or unspecified: Secondary | ICD-10-CM | POA: Insufficient documentation

## 2014-04-26 DIAGNOSIS — Z36 Encounter for antenatal screening of mother: Secondary | ICD-10-CM | POA: Insufficient documentation

## 2014-04-26 DIAGNOSIS — O3680X1 Pregnancy with inconclusive fetal viability, fetus 1: Secondary | ICD-10-CM

## 2014-04-26 DIAGNOSIS — O99211 Obesity complicating pregnancy, first trimester: Principal | ICD-10-CM

## 2014-04-26 DIAGNOSIS — Z3689 Encounter for other specified antenatal screening: Secondary | ICD-10-CM

## 2014-04-26 DIAGNOSIS — O26841 Uterine size-date discrepancy, first trimester: Secondary | ICD-10-CM

## 2014-04-26 DIAGNOSIS — Z3A13 13 weeks gestation of pregnancy: Secondary | ICD-10-CM | POA: Diagnosis not present

## 2014-04-26 DIAGNOSIS — Z3682 Encounter for antenatal screening for nuchal translucency: Secondary | ICD-10-CM

## 2014-05-18 ENCOUNTER — Inpatient Hospital Stay (HOSPITAL_COMMUNITY)
Admission: AD | Admit: 2014-05-18 | Discharge: 2014-05-18 | Disposition: A | Discharge status: Home / Self Care | Payer: No Typology Code available for payment source | Source: Ambulatory Visit | Attending: Obstetrics & Gynecology | Admitting: Obstetrics & Gynecology

## 2014-05-18 ENCOUNTER — Encounter (HOSPITAL_COMMUNITY): Payer: Self-pay

## 2014-05-18 DIAGNOSIS — B9689 Other specified bacterial agents as the cause of diseases classified elsewhere: Secondary | ICD-10-CM

## 2014-05-18 DIAGNOSIS — O26892 Other specified pregnancy related conditions, second trimester: Secondary | ICD-10-CM | POA: Diagnosis not present

## 2014-05-18 DIAGNOSIS — N760 Acute vaginitis: Secondary | ICD-10-CM | POA: Diagnosis not present

## 2014-05-18 DIAGNOSIS — Z3A16 16 weeks gestation of pregnancy: Secondary | ICD-10-CM | POA: Diagnosis not present

## 2014-05-18 DIAGNOSIS — R102 Pelvic and perineal pain: Secondary | ICD-10-CM | POA: Diagnosis not present

## 2014-05-18 DIAGNOSIS — N949 Unspecified condition associated with female genital organs and menstrual cycle: Secondary | ICD-10-CM

## 2014-05-18 LAB — WET PREP, GENITAL
Trich, Wet Prep: NONE SEEN
Yeast Wet Prep HPF POC: NONE SEEN

## 2014-05-18 LAB — URINALYSIS, ROUTINE W REFLEX MICROSCOPIC
BILIRUBIN URINE: NEGATIVE
Glucose, UA: NEGATIVE mg/dL
Hgb urine dipstick: NEGATIVE
KETONES UR: 15 mg/dL — AB
Nitrite: NEGATIVE
PROTEIN: NEGATIVE mg/dL
Specific Gravity, Urine: >1.03 — ABNORMAL HIGH (ref 1.005–1.030)
Urobilinogen, UA: 0.2 mg/dL (ref 0.0–1.0)
pH: 6 (ref 5.0–8.0)

## 2014-05-18 LAB — URINE MICROSCOPIC-ADD ON

## 2014-05-18 MED ORDER — METRONIDAZOLE 500 MG PO TABS: 500.0000 mg | ORAL_TABLET | Freq: Two times a day (BID) | ORAL | Status: DC

## 2014-05-18 NOTE — MAU Note (Signed)
Having pain RLQ for month. Radiates down to pelvis, around to back and down R leg. Have talked to them at [facility] about it couple times and told me to take Tylenol. Don't like taking medicine so have not taken anything. Denies LOF or bleeding

## 2014-05-18 NOTE — MAU Provider Note (Signed)
History     CSN: [id number]  Arrival date and time: 05/18/14 1912   None     Chief Complaint  Patient presents with  . Abdominal Pain   HPI  Jillian Reed is a 33 y.o. G2P0010 at [redacted]w[redacted]d who presents today with right sided pain. She states that she has had the pain for about a month. She has been told to take tylenol for the pain, but does not want to take that. She states that the pain is along the right side of the abdomen, and shoots down into her pelvis and groin. She denies any fever, nausea, vomiting or diarrhea. She denies any vaginal bleeding or LOF. [facility]   Past Medical History  Diagnosis Date  . Medical history non-contributory     Past Surgical History  Procedure Laterality Date  . External ear surgery      No family history on file.  History  Substance Use Topics  . Smoking status: Never Smoker   . Smokeless tobacco: Not on file  . Alcohol Use: Yes    Allergies:  Allergies  Allergen Reactions  . Percocet [Oxycodone-Acetaminophen] Itching  . Tramadol Itching and Other (See Comments)    Paranoid insomnia    Prescriptions prior to admission  Medication Sig Dispense Refill Last Dose  . acetaminophen (TYLENOL) 325 MG tablet Take 325 mg by mouth every 6 (six) hours as needed for mild pain.   Past Week at Unknown time  . Prenatal Vit-Fe Fumarate-FA (PRENATAL VITAMIN PO) Take 1 tablet by mouth daily.    05/18/2014 at Unknown time  . nitrofurantoin, macrocrystal-monohydrate, (MACROBID) 100 MG capsule Take 1 capsule (100 mg total) by mouth 2 (two) times daily. (Patient not taking: Reported on 05/18/2014) 20 capsule 0 Not Taking  . oxyCODONE-acetaminophen (PERCOCET/ROXICET) 5-325 MG per tablet Take 1 tablet by mouth every 6 (six) hours as needed for moderate pain or severe pain. (Patient not taking: Reported on 05/18/2014) 13 tablet 0 Not Taking    ROS Physical Exam   Blood pressure 148/85, pulse 96, temperature  98.4 F (36.9 C), resp. rate 20, height 5\' 7"  (1.702 m), weight 180.35 kg (397 lb 9.6 oz), last menstrual period 01/29/2014.  Physical Exam  Nursing note and vitals reviewed. Constitutional: She is oriented to person, place, and time. She appears well-developed and well-nourished. No distress.  Cardiovascular: Normal rate.   Respiratory: Effort normal.  GI: Soft. There is no tenderness. There is no rebound.  Genitourinary:   External: no lesion Vagina: large amount of yellow/white discharge Cervix: pink, smooth, no CMT Uterus: AGA    Neurological: She is alert and oriented to person, place, and time.  Skin: Skin is warm and dry.  Psychiatric: She has a normal mood and affect.   Bedside US with Dr. Macon Large, shows cardiac activity MAU Course  Procedures  Results for orders placed or performed during the hospital encounter of 05/18/14 (from the past 24 hour(s))  Urinalysis, Routine w reflex microscopic     Status: Abnormal   Collection Time: 05/18/14  7:30 PM  Result Value Ref Range   Color, Urine YELLOW YELLOW   APPearance CLEAR CLEAR   Specific Gravity, Urine >1.030 (H) 1.005 - 1.030   pH 6.0 5.0 - 8.0   Glucose, UA NEGATIVE NEGATIVE mg/dL   Hgb urine dipstick NEGATIVE NEGATIVE   Bilirubin Urine NEGATIVE NEGATIVE   Ketones, ur 15 (A) NEGATIVE mg/dL   Protein, ur NEGATIVE NEGATIVE mg/dL  Urobilinogen, UA 0.2 0.0 - 1.0 mg/dL   Nitrite NEGATIVE NEGATIVE   Leukocytes, UA SMALL (A) NEGATIVE  Urine microscopic-add on     Status: Abnormal   Collection Time: 05/18/14  7:30 PM  Result Value Ref Range   Squamous Epithelial / LPF FEW (A) RARE   WBC, UA 0-2 <3 WBC/hpf   RBC / HPF 0-2 <3 RBC/hpf   Bacteria, UA RARE RARE  Wet prep, genital     Status: Abnormal   Collection Time: 05/18/14  8:04 PM  Result Value Ref Range   Yeast Wet Prep HPF POC NONE SEEN NONE SEEN   Trich, Wet Prep NONE SEEN NONE SEEN   Clue Cells Wet Prep HPF POC MODERATE (A) NONE SEEN   WBC, Wet Prep HPF POC  MODERATE (A) NONE SEEN   Assessment and Plan   1. Round ligament pain   2. BV (bacterial vaginosis)      Medication List    TAKE these medications        acetaminophen 325 MG tablet  Commonly known as:  TYLENOL  Take 325 mg by mouth every 6 (six) hours as needed for mild pain.     metroNIDAZOLE 500 MG tablet  Commonly known as:  FLAGYL  Take 1 tablet (500 mg total) by mouth 2 (two) times daily.     nitrofurantoin (macrocrystal-monohydrate) 100 MG capsule  Commonly known as:  MACROBID  Take 1 capsule (100 mg total) by mouth 2 (two) times daily.     oxyCODONE-acetaminophen 5-325 MG per tablet  Commonly known as:  PERCOCET/ROXICET  Take 1 tablet by mouth every 6 (six) hours as needed for moderate pain or severe pain.     PRENATAL VITAMIN PO  Take 1 tablet by mouth daily.       Follow-up Information    [facility] [place]   Why:  As scheduled   Contact information:   [address] [facility] [place] [id number] [phone]      Tawnya CrookHogan, Nyasha Rahilly Donovan 05/18/2014, 7:48 PM

## 2014-05-18 NOTE — Discharge Instructions (Signed)
Round Ligament Pain During Pregnancy Round ligament pain is a sharp pain or jabbing feeling often felt in the lower belly or groin area on one or both sides. It is one of the most common complaints during pregnancy and is considered a normal part of pregnancy. It is most often felt during the second trimester.  Here is what you need to know about round ligament pain, including some tips to help you feel better.  Causes of Round Ligament Pain  Several thick ligaments surround and support your womb (uterus) as it grows during pregnancy. One of them is called the round ligament.  The round ligament connects the front part of the womb to your groin, the area where your legs attach to your pelvis. The round ligament normally tightens and relaxes slowly.  As your baby and womb grow, the round ligament stretches. That makes it more likely to become strained.  Sudden movements can cause the ligament to tighten quickly, like a rubber band snapping. This causes a sudden and quick jabbing feeling.  Symptoms of Round Ligament Pain  Round ligament pain can be concerning and uncomfortable. But it is considered normal as your body changes during pregnancy.  The symptoms of round ligament pain include a sharp, sudden spasm in the belly. It usually affects the right side, but it may happen on both sides. The pain only lasts a few seconds.  Exercise may cause the pain, as will rapid movements such as:  sneezing coughing laughing rolling over in bed standing up too quickly  Treatment of Round Ligament Pain  Here are some tips that may help reduce your discomfort:  Pain relief. Take over-the-counter acetaminophen for pain, if necessary. Ask your doctor if this is OK.  Exercise. Get plenty of exercise to keep your stomach (core) muscles strong. Doing stretching exercises or prenatal yoga can be helpful. Ask your doctor which exercises are safe for you and your baby.  A helpful exercise involves  putting your hands and knees on the floor, lowering your head, and pushing your backside into the air.  Avoid sudden movements. Change positions slowly (such as standing up or sitting down) to avoid sudden movements that may cause stretching and pain.  Flex your hips. Bend and flex your hips before you cough, sneeze, or laugh to avoid pulling on the ligaments.  Apply warmth. A heating pad or warm bath may be helpful. Ask your doctor if this is OK. Extreme heat can be dangerous to the baby.  You should try to modify your daily activity level and avoid positions that may worsen the condition.  When to Call the Doctor/Midwife  Always tell your doctor or midwife about any type of pain you have during pregnancy. Round ligament pain is quick and doesn't last long.  Call your health care provider immediately if you have:  severe pain fever chills pain on urination difficulty walking  Belly pain during pregnancy can be due to many different causes. It is important for your doctor to rule out more serious conditions, including pregnancy complications such as placenta abruption or non-pregnancy illnesses such as:  inguinal hernia appendicitis stomach, liver, and kidney problems Preterm labor pains may sometimes be mistaken for round ligament pain. Bacterial Vaginosis Bacterial vaginosis is a vaginal infection that occurs when the normal balance of bacteria in the vagina is disrupted. It results from an overgrowth of certain bacteria. This is the most common vaginal infection in women of childbearing age. Treatment is important to prevent complications, especially  in pregnant women, as it can cause a premature delivery. CAUSES  Bacterial vaginosis is caused by an increase in harmful bacteria that are normally present in smaller amounts in the vagina. Several different kinds of bacteria can cause bacterial vaginosis. However, the reason that the condition develops is not fully understood. RISK  FACTORS Certain activities or behaviors can put you at an increased risk of developing bacterial vaginosis, including:  Having a new sex partner or multiple sex partners.  Douching.  Using an intrauterine device (IUD) for contraception. Women do not get bacterial vaginosis from toilet seats, bedding, swimming pools, or contact with objects around them. SIGNS AND SYMPTOMS  Some women with bacterial vaginosis have no signs or symptoms. Common symptoms include:  Grey vaginal discharge.  A fishlike odor with discharge, especially after sexual intercourse.  Itching or burning of the vagina and vulva.  Burning or pain with urination. DIAGNOSIS  Your health care provider will take a medical history and examine the vagina for signs of bacterial vaginosis. A sample of vaginal fluid may be taken. Your health care provider will look at this sample under a microscope to check for bacteria and abnormal cells. A vaginal pH test may also be done.  TREATMENT  Bacterial vaginosis may be treated with antibiotic medicines. These may be given in the form of a pill or a vaginal cream. A second round of antibiotics may be prescribed if the condition comes back after treatment.  HOME CARE INSTRUCTIONS   Only take over-the-counter or prescription medicines as directed by your health care provider.  If antibiotic medicine was prescribed, take it as directed. Make sure you finish it even if you start to feel better.  Do not have sex until treatment is completed.  Tell all sexual partners that you have a vaginal infection. They should see their health care provider and be treated if they have problems, such as a mild rash or itching.  Practice safe sex by using condoms and only having one sex partner. SEEK MEDICAL CARE IF:   Your symptoms are not improving after 3 days of treatment.  You have increased discharge or pain.  You have a fever. MAKE SURE YOU:   Understand these instructions.  Will watch  your condition.  Will get help right away if you are not doing well or get worse. FOR MORE INFORMATION  Centers for Disease Control and Prevention, Division of STD Prevention: SolutionApps.co.za American Sexual Health Association (ASHA): www.ashastd.org  Document Released: 06/07/2005 Document Revised: 03/28/2013 Document Reviewed: 01/17/2013 [facility] Patient Information 2015 [place], [place]. This information is not intended to replace advice given to you by your health care provider. Make sure you discuss any questions you have with your health care provider.

## 2014-05-21 LAB — GC/CHLAMYDIA PROBE AMP
CT Probe RNA: NEGATIVE
GC PROBE AMP APTIMA: NEGATIVE

## 2014-06-07 ENCOUNTER — Other Ambulatory Visit (HOSPITAL_COMMUNITY): Payer: Self-pay | Admitting: Maternal and Fetal Medicine

## 2014-06-07 ENCOUNTER — Encounter (HOSPITAL_COMMUNITY): Payer: Self-pay

## 2014-06-07 ENCOUNTER — Ambulatory Visit (HOSPITAL_COMMUNITY)
Admission: RE | Admit: 2014-06-07 | Discharge: 2014-06-07 | Disposition: A | Discharge status: Home / Self Care | Payer: No Typology Code available for payment source | Source: Ambulatory Visit | Attending: Internal Medicine | Admitting: Internal Medicine

## 2014-06-07 DIAGNOSIS — Z3A19 19 weeks gestation of pregnancy: Secondary | ICD-10-CM | POA: Diagnosis not present

## 2014-06-07 DIAGNOSIS — Z3689 Encounter for other specified antenatal screening: Secondary | ICD-10-CM

## 2014-06-07 DIAGNOSIS — O99212 Obesity complicating pregnancy, second trimester: Secondary | ICD-10-CM | POA: Diagnosis present

## 2014-06-07 DIAGNOSIS — O99211 Obesity complicating pregnancy, first trimester: Secondary | ICD-10-CM

## 2014-06-07 DIAGNOSIS — Z0000 Encounter for general adult medical examination without abnormal findings: Secondary | ICD-10-CM | POA: Insufficient documentation

## 2014-06-19 ENCOUNTER — Encounter (HOSPITAL_COMMUNITY): Payer: Self-pay | Admitting: *Deleted

## 2014-06-19 ENCOUNTER — Inpatient Hospital Stay (HOSPITAL_COMMUNITY)
Admission: AD | Admit: 2014-06-19 | Discharge: 2014-06-19 | Disposition: A | Discharge status: Home / Self Care | Payer: No Typology Code available for payment source | Source: Ambulatory Visit | Attending: Obstetrics & Gynecology | Admitting: Obstetrics & Gynecology

## 2014-06-19 DIAGNOSIS — O99612 Diseases of the digestive system complicating pregnancy, second trimester: Secondary | ICD-10-CM | POA: Diagnosis not present

## 2014-06-19 DIAGNOSIS — Z3A21 21 weeks gestation of pregnancy: Secondary | ICD-10-CM | POA: Diagnosis not present

## 2014-06-19 DIAGNOSIS — A499 Bacterial infection, unspecified: Secondary | ICD-10-CM

## 2014-06-19 DIAGNOSIS — O23592 Infection of other part of genital tract in pregnancy, second trimester: Secondary | ICD-10-CM | POA: Insufficient documentation

## 2014-06-19 DIAGNOSIS — B9689 Other specified bacterial agents as the cause of diseases classified elsewhere: Secondary | ICD-10-CM | POA: Insufficient documentation

## 2014-06-19 DIAGNOSIS — N949 Unspecified condition associated with female genital organs and menstrual cycle: Secondary | ICD-10-CM | POA: Diagnosis present

## 2014-06-19 DIAGNOSIS — N760 Acute vaginitis: Secondary | ICD-10-CM | POA: Diagnosis not present

## 2014-06-19 DIAGNOSIS — K219 Gastro-esophageal reflux disease without esophagitis: Secondary | ICD-10-CM | POA: Diagnosis not present

## 2014-06-19 HISTORY — DX: Anemia, unspecified: D64.9

## 2014-06-19 HISTORY — DX: Major depressive disorder, single episode, unspecified: F32.9

## 2014-06-19 HISTORY — DX: Unspecified abnormal cytological findings in specimens from vagina: R87.629

## 2014-06-19 HISTORY — DX: Depression, unspecified: F32.A

## 2014-06-19 HISTORY — DX: Anxiety disorder, unspecified: F41.9

## 2014-06-19 HISTORY — DX: Gastro-esophageal reflux disease without esophagitis: K21.9

## 2014-06-19 LAB — URINALYSIS, ROUTINE W REFLEX MICROSCOPIC
Bilirubin Urine: NEGATIVE
Glucose, UA: NEGATIVE mg/dL
Hgb urine dipstick: NEGATIVE
KETONES UR: 15 mg/dL — AB
NITRITE: NEGATIVE
Protein, ur: NEGATIVE mg/dL
Specific Gravity, Urine: >1.03 — ABNORMAL HIGH (ref 1.005–1.030)
Urobilinogen, UA: 0.2 mg/dL (ref 0.0–1.0)
pH: 6 (ref 5.0–8.0)

## 2014-06-19 LAB — URINE MICROSCOPIC-ADD ON

## 2014-06-19 LAB — WET PREP, GENITAL
Trich, Wet Prep: NONE SEEN
YEAST WET PREP: NONE SEEN

## 2014-06-19 MED ORDER — METRONIDAZOLE 500 MG PO TABS: 500.0000 mg | ORAL_TABLET | Freq: Two times a day (BID) | ORAL | Status: DC

## 2014-06-19 MED ORDER — METRONIDAZOLE 0.75 % VA GEL: 1.0000 | Freq: Two times a day (BID) | VAGINAL | Status: DC

## 2014-06-19 NOTE — Discharge Instructions (Signed)
Bacterial Vaginosis Bacterial vaginosis is a vaginal infection that occurs when the normal balance of bacteria in the vagina is disrupted. It results from an overgrowth of certain bacteria. This is the most common vaginal infection in women of childbearing age. Treatment is important to prevent complications, especially in pregnant women, as it can cause a premature delivery. CAUSES  Bacterial vaginosis is caused by an increase in harmful bacteria that are normally present in smaller amounts in the vagina. Several different kinds of bacteria can cause bacterial vaginosis. However, the reason that the condition develops is not fully understood. RISK FACTORS Certain activities or behaviors can put you at an increased risk of developing bacterial vaginosis, including:  Having a new sex partner or multiple sex partners.  Douching.  Using an intrauterine device (IUD) for contraception. Women do not get bacterial vaginosis from toilet seats, bedding, swimming pools, or contact with objects around them. SIGNS AND SYMPTOMS  Some women with bacterial vaginosis have no signs or symptoms. Common symptoms include:  Grey vaginal discharge.  A fishlike odor with discharge, especially after sexual intercourse.  Itching or burning of the vagina and vulva.  Burning or pain with urination. DIAGNOSIS  Your health care provider will take a medical history and examine the vagina for signs of bacterial vaginosis. A sample of vaginal fluid may be taken. Your health care provider will look at this sample under a microscope to check for bacteria and abnormal cells. A vaginal pH test may also be done.  TREATMENT  Bacterial vaginosis may be treated with antibiotic medicines. These may be given in the form of a pill or a vaginal cream. A second round of antibiotics may be prescribed if the condition comes back after treatment.  HOME CARE INSTRUCTIONS   Only take over-the-counter or prescription medicines as  directed by your health care provider.  If antibiotic medicine was prescribed, take it as directed. Make sure you finish it even if you start to feel better.  Do not have sex until treatment is completed.  Tell all sexual partners that you have a vaginal infection. They should see their health care provider and be treated if they have problems, such as a mild rash or itching.  Practice safe sex by using condoms and only having one sex partner. SEEK MEDICAL CARE IF:   Your symptoms are not improving after 3 days of treatment.  You have increased discharge or pain.  You have a fever. MAKE SURE YOU:   Understand these instructions.  Will watch your condition.  Will get help right away if you are not doing well or get worse. FOR MORE INFORMATION  Centers for Disease Control and Prevention, Division of STD Prevention: www.cdc.gov/std American Sexual Health Association (ASHA): www.ashastd.org  Document Released: 06/07/2005 Document Revised: 03/28/2013 Document Reviewed: 01/17/2013 [employer] Patient Information 2015 [employer]. This information is not intended to replace advice given to you by your health care provider. Make sure you discuss any questions you have with your health care provider.  

## 2014-06-19 NOTE — MAU Note (Signed)
Pt reports lower abd pain and lower back pain all day. Denies bleeding.

## 2014-06-19 NOTE — MAU Provider Note (Signed)
History     CSN: [id number]  Arrival date and time: 06/19/14 2001   First Provider Initiated Contact with Patient 06/19/14 2150      Chief Complaint  Patient presents with  . Abdominal Pain  . Back Pain   HPI  Pt is a 33 yo G2P0010 at [redacted]w[redacted]d wks IUP here with report of lower pelvic pain and back pain that started this morning.  Denies vaginal bleeding or leaking of fluid.  +white vaginal discharge with no odor or itching.  No report of UTI symptoms.    Past Medical History  Diagnosis Date  . Medical history non-contributory   . Vaginal Pap smear, abnormal   . Anemia   . Anxiety   . Depression   . GERD (gastroesophageal reflux disease)     Past Surgical History  Procedure Laterality Date  . External ear surgery      No family history on file.  History  Substance Use Topics  . Smoking status: Never Smoker   . Smokeless tobacco: Never Used  . Alcohol Use: Yes     Comment: occassionally when not pregnant    Allergies:  Allergies  Allergen Reactions  . Percocet [Oxycodone-Acetaminophen] Itching  . Tramadol Itching and Other (See Comments)    Paranoid insomnia    Prescriptions prior to admission  Medication Sig Dispense Refill Last Dose  . acetaminophen (TYLENOL) 325 MG tablet Take 325 mg by mouth every 6 (six) hours as needed for mild pain.   Past Week at Unknown time  . Prenatal Vit-Fe Fumarate-FA (PRENATAL VITAMIN PO) Take 1 tablet by mouth daily.    06/19/2014 at 1000  . metroNIDAZOLE (FLAGYL) 500 MG tablet Take 1 tablet (500 mg total) by mouth 2 (two) times daily. 14 tablet 0   . nitrofurantoin, macrocrystal-monohydrate, (MACROBID) 100 MG capsule Take 1 capsule (100 mg total) by mouth 2 (two) times daily. (Patient not taking: Reported on 05/18/2014) 20 capsule 0 Not Taking  . oxyCODONE-acetaminophen (PERCOCET/ROXICET) 5-325 MG per tablet Take 1 tablet by mouth every 6 (six) hours as needed for moderate pain or severe pain. (Patient not taking: Reported on  05/18/2014) 13 tablet 0 Not Taking    Review of Systems  Gastrointestinal: Positive for nausea and abdominal pain (midpelvic). Negative for vomiting.  Genitourinary: Negative for dysuria, urgency and frequency.  All other systems reviewed and are negative.  Physical Exam   Blood pressure 120/83, pulse 92, temperature 99.1 F (37.3 C), temperature source Oral, resp. rate 20, height 5\' 7"  (1.702 m), weight 182.8 kg (403 lb), last menstrual period 01/29/2014, SpO2 100 %.  Physical Exam  Constitutional: She is oriented to person, place, and time. She appears well-developed and well-nourished. No distress.  HENT:  Head: Normocephalic.  Neck: Normal range of motion. Neck supple.  Cardiovascular: Normal rate, regular rhythm and normal heart sounds.   Respiratory: Effort normal and breath sounds normal.  GI: Soft. There is no tenderness.  Genitourinary: No bleeding in the vagina. Vaginal discharge (mucusy) found.  Neurological: She is alert and oriented to person, place, and time.  Skin: Skin is warm and dry.   Dilation: Closed Effacement (%): Thick Cervical Position: Middle Exam by:: Margarita Mail, CNM  Wet prep: few clue, few WBC  Urinalysis    Component Value Date/Time   COLORURINE YELLOW 06/19/2014 2014   APPEARANCEUR CLEAR 06/19/2014 2014   LABSPEC >1.030* 06/19/2014 2014   PHURINE 6.0 06/19/2014 2014   GLUCOSEU NEGATIVE 06/19/2014 2014   HGBUR NEGATIVE 06/19/2014 2014  BILIRUBINUR NEGATIVE 06/19/2014 2014   BILIRUBINUR neg 03/06/2014 1528   KETONESUR 15* 06/19/2014 2014   PROTEINUR NEGATIVE 06/19/2014 2014   PROTEINUR neg 03/06/2014 1528   UROBILINOGEN 0.2 06/19/2014 2014   UROBILINOGEN 0.2 03/06/2014 1528   NITRITE NEGATIVE 06/19/2014 2014   NITRITE neg 03/06/2014 1528   LEUKOCYTESUR SMALL* 06/19/2014 2014      MAU Course  Procedures  2155 Report given to Philipp DeputyKim Ileen Kahre who assumes care of patient. Eino FarberWalidah Kennith GainN Karim, CNM   Assessment and Plan  IUP@21 .0wks Pelvic  pain BV  D/C home Rev'd BV with pt: she took the tx of Flagyl for this last month. Will try an rx for MetroGel instead, BID x 5d. If the MetroGel is too expensive, will give pt an rx for Flagyl to take instead 500 BID x 7d. It was rev'd with her that although she has 2 prescriptions, she only needs to take one. [facility]14 or sooner prn.  Cam HaiSHAW, Alexa Blish CNM 06/19/2014 11:13 PM

## 2014-06-21 NOTE — L&D Delivery Note (Signed)
Patient is 34 y.o. G2P0010 [redacted]w[redacted]d admitted in early labor, hx of chronic hypertension and morbid obesity with Body mass index is 67.96 kg/(m^2).   Delivery Note At 6:12 AM a viable female was delivered via Vaginal, Spontaneous Delivery (Presentation: ; Occiput Anterior).  APGAR: 7, 9; weight 7 lb 11.3 oz (3496 g).   Placenta status: Intact, Spontaneous.  Cord: 3 vessels with the following complications: None  Anesthesia: Epidural  Episiotomy: None Lacerations: 1st degree;Perineal Suture Repair: 3.0 vicryl rapide Est. Blood Loss (mL): 347   Mom to postpartum.  Baby to initially to warmer as baby had no tone and no respiratory effort, however prior to transfer shortly after cutting cord baby started to cry and tone drastically improved, then to skin to skin.  Jillian Reed Jillian Reed 10/20/2014, 6:31 AM

## 2014-07-05 ENCOUNTER — Other Ambulatory Visit (HOSPITAL_COMMUNITY): Payer: Self-pay | Admitting: Maternal and Fetal Medicine

## 2014-07-05 ENCOUNTER — Ambulatory Visit (HOSPITAL_COMMUNITY)
Admission: RE | Admit: 2014-07-05 | Discharge: 2014-07-05 | Disposition: A | Discharge status: Home / Self Care | Payer: Medicaid Other | Source: Ambulatory Visit | Attending: Nurse Practitioner | Admitting: Nurse Practitioner

## 2014-07-05 DIAGNOSIS — O99212 Obesity complicating pregnancy, second trimester: Secondary | ICD-10-CM | POA: Diagnosis present

## 2014-07-05 DIAGNOSIS — Z3A23 23 weeks gestation of pregnancy: Secondary | ICD-10-CM | POA: Diagnosis not present

## 2014-07-05 DIAGNOSIS — Z3689 Encounter for other specified antenatal screening: Secondary | ICD-10-CM

## 2014-07-05 DIAGNOSIS — O99211 Obesity complicating pregnancy, first trimester: Principal | ICD-10-CM

## 2014-07-05 DIAGNOSIS — IMO0002 Reserved for concepts with insufficient information to code with codable children: Secondary | ICD-10-CM | POA: Insufficient documentation

## 2014-07-05 DIAGNOSIS — Z0489 Encounter for examination and observation for other specified reasons: Secondary | ICD-10-CM | POA: Insufficient documentation

## 2014-07-05 DIAGNOSIS — Z36 Encounter for antenatal screening of mother: Secondary | ICD-10-CM | POA: Diagnosis not present

## 2014-07-08 ENCOUNTER — Encounter: Payer: Self-pay | Admitting: Obstetrics & Gynecology

## 2014-07-08 ENCOUNTER — Ambulatory Visit (INDEPENDENT_AMBULATORY_CARE_PROVIDER_SITE_OTHER): Payer: Medicaid Other | Admitting: Obstetrics & Gynecology

## 2014-07-08 VITALS — BP 139/72 | HR 87 | Temp 97.5°F | Wt >= 6400 oz

## 2014-07-08 DIAGNOSIS — O2692 Pregnancy related conditions, unspecified, second trimester: Secondary | ICD-10-CM

## 2014-07-08 DIAGNOSIS — O99211 Obesity complicating pregnancy, first trimester: Secondary | ICD-10-CM

## 2014-07-08 DIAGNOSIS — O26892 Other specified pregnancy related conditions, second trimester: Secondary | ICD-10-CM

## 2014-07-08 DIAGNOSIS — M545 Low back pain, unspecified: Secondary | ICD-10-CM

## 2014-07-08 DIAGNOSIS — O10919 Unspecified pre-existing hypertension complicating pregnancy, unspecified trimester: Secondary | ICD-10-CM | POA: Insufficient documentation

## 2014-07-08 DIAGNOSIS — O10912 Unspecified pre-existing hypertension complicating pregnancy, second trimester: Secondary | ICD-10-CM

## 2014-07-08 LAB — COMPREHENSIVE METABOLIC PANEL
ALBUMIN: 3.4 g/dL — AB (ref 3.5–5.2)
ALT: 12 U/L (ref 0–35)
AST: 15 U/L (ref 0–37)
Alkaline Phosphatase: 88 U/L (ref 39–117)
BILIRUBIN TOTAL: 0.3 mg/dL (ref 0.2–1.2)
BUN: 8 mg/dL (ref 6–23)
CO2: 24 mEq/L (ref 19–32)
Calcium: 9.6 mg/dL (ref 8.4–10.5)
Chloride: 103 mEq/L (ref 96–112)
Creat: 0.76 mg/dL (ref 0.50–1.10)
Glucose, Bld: 83 mg/dL (ref 70–99)
POTASSIUM: 4.4 meq/L (ref 3.5–5.3)
Sodium: 136 mEq/L (ref 135–145)
Total Protein: 7 g/dL (ref 6.0–8.3)

## 2014-07-08 LAB — POCT URINALYSIS DIP (DEVICE)
Bilirubin Urine: NEGATIVE
GLUCOSE, UA: NEGATIVE mg/dL
Hgb urine dipstick: NEGATIVE
Nitrite: NEGATIVE
PH: 7.5 (ref 5.0–8.0)
PROTEIN: NEGATIVE mg/dL
SPECIFIC GRAVITY, URINE: 1.02 (ref 1.005–1.030)
UROBILINOGEN UA: 0.2 mg/dL (ref 0.0–1.0)

## 2014-07-08 LAB — CBC
HEMATOCRIT: 37.7 % (ref 36.0–46.0)
Hemoglobin: 12.3 g/dL (ref 12.0–15.0)
MCH: 27.2 pg (ref 26.0–34.0)
MCHC: 32.6 g/dL (ref 30.0–36.0)
MCV: 83.2 fL (ref 78.0–100.0)
MPV: 10.5 fL (ref 8.6–12.4)
Platelets: 294 10*3/uL (ref 150–400)
RBC: 4.53 MIL/uL (ref 3.87–5.11)
RDW: 18.1 % — AB (ref 11.5–15.5)
WBC: 10.5 10*3/uL (ref 4.0–10.5)

## 2014-07-08 MED ORDER — CYCLOBENZAPRINE HCL 10 MG PO TABS: 10.0000 mg | ORAL_TABLET | Freq: Three times a day (TID) | ORAL | Status: DC | PRN

## 2014-07-08 MED ORDER — ASPIRIN EC 81 MG PO TBEC: 81.0000 mg | DELAYED_RELEASE_TABLET | Freq: Every day | ORAL | Status: DC

## 2014-07-08 NOTE — Progress Notes (Signed)
Transfer from HD for HTN. Up to date on lab work.  C/o of sciatica-- reports keeps her up at night.

## 2014-07-08 NOTE — Patient Instructions (Signed)
Return to clinic for any obstetric concerns or go to MAU for evaluation  

## 2014-07-08 NOTE — Progress Notes (Signed)
Transferred for [facility]  [x]  Baseline labs (CBC, CMP, urine Pr:Cr) [x]  Baby ASA prescribed after 12 weeks [ ]  Serial growth scans 20-24-28-32-35-38 [ ]  Antenatal testing starting at 32 weeks [ ]  Delivery by 39 weeks (with meds, 40 with no meds) or earlier if needed Discussed implications of CHTN in pregnancy, need for antenatal testing and frequent ultrasounds/prenatal visits, need for optimizing BP control to decrease CHTN/preeclampsia associated maternal-fetal morbidity and mortality. Will check baseline labs today.  Had recent growth scan on 07/05/14. Really complains of left sided sciatica, unable to lie flat. Will see Dr. Adrian Blackwater (DO) next week for evaluation. Flexeril prescribed. No other complaints or concerns.  Labor and fetal movement precautions reviewed.

## 2014-07-09 LAB — PROTEIN / CREATININE RATIO, URINE
Creatinine, Urine: 234.9 mg/dL
PROTEIN CREATININE RATIO: 0.1 (ref <0.15)
TOTAL PROTEIN, URINE: 24 mg/dL (ref 5–24)

## 2014-07-15 ENCOUNTER — Ambulatory Visit (INDEPENDENT_AMBULATORY_CARE_PROVIDER_SITE_OTHER): Payer: Medicaid Other | Admitting: Family Medicine

## 2014-07-15 VITALS — BP 131/82 | HR 109 | Temp 97.6°F | Wt >= 6400 oz

## 2014-07-15 DIAGNOSIS — M9903 Segmental and somatic dysfunction of lumbar region: Secondary | ICD-10-CM

## 2014-07-15 DIAGNOSIS — M545 Low back pain: Secondary | ICD-10-CM

## 2014-07-15 DIAGNOSIS — O2692 Pregnancy related conditions, unspecified, second trimester: Secondary | ICD-10-CM

## 2014-07-15 DIAGNOSIS — O26892 Other specified pregnancy related conditions, second trimester: Principal | ICD-10-CM

## 2014-07-15 DIAGNOSIS — O10912 Unspecified pre-existing hypertension complicating pregnancy, second trimester: Secondary | ICD-10-CM

## 2014-07-15 LAB — POCT URINALYSIS DIP (DEVICE)
Bilirubin Urine: NEGATIVE
Glucose, UA: NEGATIVE mg/dL
Ketones, ur: NEGATIVE mg/dL
Nitrite: NEGATIVE
PH: 6.5 (ref 5.0–8.0)
PROTEIN: NEGATIVE mg/dL
SPECIFIC GRAVITY, URINE: 1.025 (ref 1.005–1.030)
Urobilinogen, UA: 0.2 mg/dL (ref 0.0–1.0)

## 2014-07-15 MED ORDER — CYCLOBENZAPRINE HCL 10 MG PO TABS
10.0000 mg | ORAL_TABLET | Freq: Three times a day (TID) | ORAL | Status: DC | PRN
Start: 2014-07-15 — End: 2014-08-15

## 2014-07-15 NOTE — Progress Notes (Signed)
Reports she tried to fill flexeril RX and [facility] said they didn't have it-- she has been taking tylenol #3 (prescribed by dentist) with relief but requests we send flexeril should she need it.

## 2014-07-15 NOTE — Progress Notes (Signed)
Having left sided sciatica with radiation to left knee.  Worse when laying down.  Sleeps in recliner.  Pain is fairly constant.  Tylenol with codeine somewhat helpful.   OSE: L5 FSRL.  L/L torsion.  Right ant innom. OMT done today.  Patient tolerated well.  No other concerns. Refilled flexeril.

## 2014-07-30 ENCOUNTER — Ambulatory Visit (INDEPENDENT_AMBULATORY_CARE_PROVIDER_SITE_OTHER): Payer: Medicaid Other | Admitting: Obstetrics and Gynecology

## 2014-07-30 ENCOUNTER — Encounter: Payer: Self-pay | Admitting: Obstetrics and Gynecology

## 2014-07-30 VITALS — BP 107/64 | HR 99 | Wt >= 6400 oz

## 2014-07-30 DIAGNOSIS — Z23 Encounter for immunization: Secondary | ICD-10-CM

## 2014-07-30 DIAGNOSIS — M5432 Sciatica, left side: Secondary | ICD-10-CM

## 2014-07-30 DIAGNOSIS — O99211 Obesity complicating pregnancy, first trimester: Secondary | ICD-10-CM

## 2014-07-30 DIAGNOSIS — O10913 Unspecified pre-existing hypertension complicating pregnancy, third trimester: Secondary | ICD-10-CM

## 2014-07-30 LAB — CBC
HCT: 37 % (ref 36.0–46.0)
HEMOGLOBIN: 12.1 g/dL (ref 12.0–15.0)
MCH: 27.4 pg (ref 26.0–34.0)
MCHC: 32.7 g/dL (ref 30.0–36.0)
MCV: 83.7 fL (ref 78.0–100.0)
MPV: 10.2 fL (ref 8.6–12.4)
Platelets: 254 10*3/uL (ref 150–400)
RBC: 4.42 MIL/uL (ref 3.87–5.11)
RDW: 17.2 % — AB (ref 11.5–15.5)
WBC: 10.3 10*3/uL (ref 4.0–10.5)

## 2014-07-30 LAB — POCT URINALYSIS DIP (DEVICE)
Glucose, UA: NEGATIVE mg/dL
Hgb urine dipstick: NEGATIVE
Ketones, ur: NEGATIVE mg/dL
NITRITE: NEGATIVE
PH: 7 (ref 5.0–8.0)
Protein, ur: 30 mg/dL — AB
Specific Gravity, Urine: 1.02 (ref 1.005–1.030)
Urobilinogen, UA: 1 mg/dL (ref 0.0–1.0)

## 2014-07-30 MED ORDER — TETANUS-DIPHTH-ACELL PERTUSSIS 5-2.5-18.5 LF-MCG/0.5 IM SUSP
0.5000 mL | Freq: Once | INTRAMUSCULAR | Status: AC
  Administered 2014-07-30: 0.5 mL via INTRAMUSCULAR

## 2014-07-30 NOTE — Progress Notes (Signed)
Patient is complaining of worsening left sciatic pain not relieved by flexeril. She is unable to find a maternity support belt in her size. Will refer to PT for assistance. FM/PTL precautions reviewed. 1 hr GCT, labs and Tdap today

## 2014-07-30 NOTE — Progress Notes (Signed)
C/o contraction off and on for one day only about a week ago.

## 2014-07-31 LAB — RPR

## 2014-07-31 LAB — HIV ANTIBODY (ROUTINE TESTING W REFLEX): HIV 1&2 Ab, 4th Generation: NONREACTIVE

## 2014-07-31 LAB — GLUCOSE TOLERANCE, 1 HOUR (50G) W/O FASTING: Glucose, 1 Hour GTT: 154 mg/dL — ABNORMAL HIGH (ref 70–140)

## 2014-08-01 ENCOUNTER — Telehealth: Payer: Self-pay | Admitting: *Deleted

## 2014-08-01 NOTE — Telephone Encounter (Addendum)
-----   Message from Catalina Antigua, MD sent at 07/31/2014  1:04 PM EST ----- Please inform patient of abnormal 1 hr GCT and need for 3 hour test prior to her next appointment  2/11  1116  Called pt and informed her of abnormal 1hr GTT and now requiring 3hr GTT. Procedure explained.  Pt voiced understanding agreed to appt on 2/17 @ 0830.  Diane Day RNC

## 2014-08-03 ENCOUNTER — Inpatient Hospital Stay (HOSPITAL_COMMUNITY)
Admission: AD | Admit: 2014-08-03 | Discharge: 2014-08-03 | Disposition: A | Discharge status: Home / Self Care | Payer: Medicaid Other | Source: Ambulatory Visit | Attending: Obstetrics & Gynecology | Admitting: Obstetrics & Gynecology

## 2014-08-03 ENCOUNTER — Encounter (HOSPITAL_COMMUNITY): Payer: Self-pay | Admitting: *Deleted

## 2014-08-03 DIAGNOSIS — N72 Inflammatory disease of cervix uteri: Secondary | ICD-10-CM

## 2014-08-03 DIAGNOSIS — O23512 Infections of cervix in pregnancy, second trimester: Secondary | ICD-10-CM | POA: Diagnosis not present

## 2014-08-03 DIAGNOSIS — Z3A27 27 weeks gestation of pregnancy: Secondary | ICD-10-CM | POA: Diagnosis not present

## 2014-08-03 DIAGNOSIS — R109 Unspecified abdominal pain: Secondary | ICD-10-CM | POA: Diagnosis present

## 2014-08-03 LAB — URINE MICROSCOPIC-ADD ON

## 2014-08-03 LAB — URINALYSIS, ROUTINE W REFLEX MICROSCOPIC
BILIRUBIN URINE: NEGATIVE
Glucose, UA: NEGATIVE mg/dL
Hgb urine dipstick: NEGATIVE
Ketones, ur: NEGATIVE mg/dL
NITRITE: NEGATIVE
PROTEIN: NEGATIVE mg/dL
SPECIFIC GRAVITY, URINE: 1.025 (ref 1.005–1.030)
UROBILINOGEN UA: 0.2 mg/dL (ref 0.0–1.0)
pH: 6 (ref 5.0–8.0)

## 2014-08-03 LAB — WET PREP, GENITAL
CLUE CELLS WET PREP: NONE SEEN
TRICH WET PREP: NONE SEEN
YEAST WET PREP: NONE SEEN

## 2014-08-03 MED ORDER — CEFTRIAXONE SODIUM 250 MG IJ SOLR
250.0000 mg | Freq: Once | INTRAMUSCULAR | Status: AC
  Administered 2014-08-03: 250 mg via INTRAMUSCULAR
  Filled 2014-08-03: qty 250

## 2014-08-03 MED ORDER — AZITHROMYCIN 250 MG PO TABS
1000.0000 mg | ORAL_TABLET | Freq: Once | ORAL | Status: AC
  Administered 2014-08-03: 1000 mg via ORAL
  Filled 2014-08-03: qty 4

## 2014-08-03 NOTE — Discharge Instructions (Signed)
Cervicitis °Cervicitis is a soreness and swelling (inflammation) of the cervix. Your cervix is located at the bottom of your uterus. It opens up to the vagina. °CAUSES  °· Sexually transmitted infections (STIs).   °· Allergic reaction.   °· Medicines or birth control devices that are put in the vagina.   °· Injury to the cervix.   °· Bacterial infections.   °RISK FACTORS °You are at greater risk if you: °· Have unprotected sexual intercourse. °· Have sexual intercourse with many partners. °· Began sexual intercourse at an early age. °· Have a history of STIs. °SYMPTOMS  °There may be no symptoms. If symptoms occur, they may include:  °· Gray, white, yellow, or bad-smelling vaginal discharge.   °· Pain or itching of the area outside the vagina.   °· Painful sexual intercourse.   °· Lower abdominal or lower back pain, especially during intercourse.   °· Frequent urination.   °· Abnormal vaginal bleeding between periods, after sexual intercourse, or after menopause.   °· Pressure or a heavy feeling in the pelvis.   °DIAGNOSIS  °Diagnosis is made after a pelvic exam. Other tests may include:  °· Examination of any discharge under a microscope (wet prep).   °· A Pap test.   °TREATMENT  °Treatment will depend on the cause of cervicitis. If it is caused by an STI, both you and your partner will need to be treated. Antibiotic medicines will be given.  °HOME CARE INSTRUCTIONS  °· Do not have sexual intercourse until your health care provider says it is okay.   °· Do not have sexual intercourse until your partner has been treated, if your cervicitis is caused by an STI.   °· Take your antibiotics as directed. Finish them even if you start to feel better.   °SEEK MEDICAL CARE IF: °· Your symptoms come back.   °· You have a fever.   °MAKE SURE YOU:  °· Understand these instructions. °· Will watch your condition. °· Will get help right away if you are not doing well or get worse. °Document Released: 06/07/2005 Document Revised:  06/12/2013 Document Reviewed: 11/29/2012 °ExitCare® Patient Information ©2015 [employer]. This information is not intended to replace advice given to you by your health care provider. Make sure you discuss any questions you have with your health care provider. ° °

## 2014-08-03 NOTE — MAU Provider Note (Signed)
History     CSN: [id number]  Arrival date and time: 08/03/14 1234   None     Chief Complaint  Patient presents with  . Abdominal Pain  . Vaginal Pain   HPI Pt i s94w3d pregnant c/o of vaginal pain /contractions radiating to her back since last night.  Pt took Tylenol#3 without any relief.  Pt also c/o of vaginal discharge- white and thick.  Pt denies itching or burning and denies constipation or diarrhea.  Pt denies UTI sx.  Last IC 2 weeks ago without dyspareunia.  Pt has borderline BP and is undergoing testing for gestational diabetes. RN note: Registered Nurse Signed  MAU Note 08/03/2014 12:54 PM    Expand All Collapse All   Patient presents at [redacted] weeks gestation with complaints of abdominal cramping and vaginal pressure since last night. Denies vaginal bleeding but states she has a discharge and a hx of bv X 2 with this pregnancy. States fetus is active      Past Medical History  Diagnosis Date  . Vaginal Pap smear, abnormal   . Anemia   . Anxiety   . Depression   . GERD (gastroesophageal reflux disease)   . Hypertension     Past Surgical History  Procedure Laterality Date  . External ear surgery      Family History  Problem Relation Age of Onset  . Diabetes Mother   . Hypertension Mother     History  Substance Use Topics  . Smoking status: Never Smoker   . Smokeless tobacco: Never Used  . Alcohol Use: Yes     Comment: occassionally when not pregnant    Allergies:  Allergies  Allergen Reactions  . Percocet [Oxycodone-Acetaminophen] Itching  . Tramadol Itching and Other (See Comments)    Paranoid insomnia    Prescriptions prior to admission  Medication Sig Dispense Refill Last Dose  . acetaminophen (TYLENOL) 325 MG tablet Take 325 mg by mouth every 6 (six) hours as needed for mild pain.   Past Week at Unknown time  . acetaminophen-codeine (TYLENOL #3) 300-30 MG per tablet Take 1 tablet by mouth every 4 (four) hours as needed for moderate pain.    08/02/2014 at Unknown time  . aspirin EC 81 MG tablet Take 1 tablet (81 mg total) by mouth daily. Take after 12 weeks for prevention of preeclampssia later in pregnancy 300 tablet 2 08/03/2014 at Unknown time  . Prenatal Vit-Fe Fumarate-FA (PRENATAL VITAMIN PO) Take 1 tablet by mouth daily.    08/03/2014 at Unknown time  . cyclobenzaprine (FLEXERIL) 10 MG tablet Take 1 tablet (10 mg total) by mouth every 8 (eight) hours as needed for muscle spasms. 30 tablet 1 08/01/2014    Review of Systems  Constitutional: Negative for fever and chills.  Gastrointestinal: Negative for nausea, vomiting, abdominal pain, diarrhea and constipation.  Genitourinary: Negative for dysuria and urgency.   Physical Exam   Blood pressure 123/69, pulse 95, temperature 97.9 F (36.6 C), temperature source Oral, resp. rate 24, height  (1.727 m), weight 400 lb 6 oz (181.609 kg), last menstrual period 01/29/2014.  Physical Exam  Nursing note and vitals reviewed. Constitutional: She is oriented to person, place, and time. She appears well-developed and well-nourished. No distress.  HENT:  Head: Normocephalic.  Eyes: Pupils are equal, round, and reactive to light.  Neck: Normal range of motion. Neck supple.  Cardiovascular: Normal rate.   Respiratory: Effort normal.  GI: Soft.  Genitourinary:  Mod-large amount thick yellow discharge  from os; cervix reddened- closed, long, mildly tender with bimanual mildly tender  Musculoskeletal: Normal range of motion.  Neurological: She is alert and oriented to person, place, and time.  Skin: Skin is warm and dry.  Psychiatric: She has a normal mood and affect.    MAU Course  Procedures Rocephin 250mg  Im and Zithromax 1 gm PO given in MAU FHR reactive; no ctx noted Results for orders placed or performed during the hospital encounter of 08/03/14 (from the past 24 hour(s))  Urinalysis, Routine w reflex microscopic     Status: Abnormal   Collection Time: 08/03/14 12:45 PM   Result Value Ref Range   Color, Urine YELLOW YELLOW   APPearance HAZY (A) CLEAR   Specific Gravity, Urine 1.025 1.005 - 1.030   pH 6.0 5.0 - 8.0   Glucose, UA NEGATIVE NEGATIVE mg/dL   Hgb urine dipstick NEGATIVE NEGATIVE   Bilirubin Urine NEGATIVE NEGATIVE   Ketones, ur NEGATIVE NEGATIVE mg/dL   Protein, ur NEGATIVE NEGATIVE mg/dL   Urobilinogen, UA 0.2 0.0 - 1.0 mg/dL   Nitrite NEGATIVE NEGATIVE   Leukocytes, UA MODERATE (A) NEGATIVE  Urine microscopic-add on     Status: Abnormal   Collection Time: 08/03/14 12:45 PM  Result Value Ref Range   Squamous Epithelial / LPF FEW (A) RARE   WBC, UA 11-20 <3 WBC/hpf   RBC / HPF 0-2 <3 RBC/hpf   Bacteria, UA MANY (A) RARE  Wet prep, genital     Status: Abnormal   Collection Time: 08/03/14  3:18 PM  Result Value Ref Range   Yeast Wet Prep HPF POC NONE SEEN NONE SEEN   Trich, Wet Prep NONE SEEN NONE SEEN   Clue Cells Wet Prep HPF POC NONE SEEN NONE SEEN   WBC, Wet Prep HPF POC MANY (A) NONE SEEN  GC/chlamydia pending Assessment and Plan  Cervicitis- Rocephin and Zithromax in MAU Follow up with OB appointments  Bonnita Newby 08/03/2014, 3:01 PM

## 2014-08-03 NOTE — MAU Note (Signed)
Patient presents at [redacted] weeks gestation with complaints of abdominal cramping and vaginal pressure since last night. Denies vaginal bleeding but states she has a discharge and a hx of bv X 2 with this pregnancy. States fetus is active.

## 2014-08-05 LAB — GC/CHLAMYDIA PROBE AMP (~~LOC~~) NOT AT ARMC
Chlamydia: NEGATIVE
NEISSERIA GONORRHEA: NEGATIVE

## 2014-08-07 ENCOUNTER — Other Ambulatory Visit: Payer: Medicaid Other

## 2014-08-07 DIAGNOSIS — R7309 Other abnormal glucose: Secondary | ICD-10-CM

## 2014-08-08 LAB — GLUCOSE TOLERANCE, 3 HOURS
GLUCOSE 3 HOUR GTT: 67 mg/dL — AB (ref 70–144)
GLUCOSE, 1 HOUR-GESTATIONAL: 152 mg/dL (ref 70–189)
Glucose Tolerance, 2 hour: 101 mg/dL (ref 70–164)
Glucose Tolerance, Fasting: 98 mg/dL (ref 70–104)

## 2014-08-15 ENCOUNTER — Ambulatory Visit (INDEPENDENT_AMBULATORY_CARE_PROVIDER_SITE_OTHER): Payer: Medicaid Other | Admitting: Obstetrics & Gynecology

## 2014-08-15 VITALS — BP 137/79 | HR 84 | Temp 98.4°F | Wt >= 6400 oz

## 2014-08-15 DIAGNOSIS — O10913 Unspecified pre-existing hypertension complicating pregnancy, third trimester: Secondary | ICD-10-CM

## 2014-08-15 LAB — POCT URINALYSIS DIP (DEVICE)
BILIRUBIN URINE: NEGATIVE
GLUCOSE, UA: NEGATIVE mg/dL
Hgb urine dipstick: NEGATIVE
KETONES UR: NEGATIVE mg/dL
NITRITE: NEGATIVE
Protein, ur: NEGATIVE mg/dL
Specific Gravity, Urine: 1.025 (ref 1.005–1.030)
Urobilinogen, UA: 0.2 mg/dL (ref 0.0–1.0)
pH: 6.5 (ref 5.0–8.0)

## 2014-08-15 NOTE — Progress Notes (Signed)
Sciatic pain is stable  She is dealing

## 2014-08-15 NOTE — Progress Notes (Signed)
Patient reports back and pelvic pressure; also reports pain in her sides

## 2014-08-15 NOTE — Patient Instructions (Signed)
Third Trimester of Pregnancy The third trimester is from week 29 through week 42, months 7 through 9. The third trimester is a time when the fetus is growing rapidly. At the end of the ninth month, the fetus is about 20 inches in length and weighs 6-10 pounds.  BODY CHANGES Your body goes through many changes during pregnancy. The changes vary from woman to woman.   Your weight will continue to increase. You can expect to gain 25-35 pounds (11-16 kg) by the end of the pregnancy.  You may begin to get stretch marks on your hips, abdomen, and breasts.  You may urinate more often because the fetus is moving lower into your pelvis and pressing on your bladder.  You may develop or continue to have heartburn as a result of your pregnancy.  You may develop constipation because certain hormones are causing the muscles that push waste through your intestines to slow down.  You may develop hemorrhoids or swollen, bulging veins (varicose veins).  You may have pelvic pain because of the weight gain and pregnancy hormones relaxing your joints between the bones in your pelvis. Backaches may result from overexertion of the muscles supporting your posture.  You may have changes in your hair. These can include thickening of your hair, rapid growth, and changes in texture. Some women also have hair loss during or after pregnancy, or hair that feels dry or thin. Your hair will most likely return to normal after your baby is born.  Your breasts will continue to grow and be tender. A yellow discharge may leak from your breasts called colostrum.  Your belly button may stick out.  You may feel short of breath because of your expanding uterus.  You may notice the fetus "dropping," or moving lower in your abdomen.  You may have a bloody mucus discharge. This usually occurs a few days to a week before labor begins.  Your cervix becomes thin and soft (effaced) near your due date. WHAT TO EXPECT AT YOUR PRENATAL  EXAMS  You will have prenatal exams every 2 weeks until week 36. Then, you will have weekly prenatal exams. During a routine prenatal visit:  You will be weighed to make sure you and the fetus are growing normally.  Your blood pressure is taken.  Your abdomen will be measured to track your baby's growth.  The fetal heartbeat will be listened to.  Any test results from the previous visit will be discussed.  You may have a cervical check near your due date to see if you have effaced. At around 36 weeks, your caregiver will check your cervix. At the same time, your caregiver will also perform a test on the secretions of the vaginal tissue. This test is to determine if a type of bacteria, Group B streptococcus, is present. Your caregiver will explain this further. Your caregiver may ask you:  What your birth plan is.  How you are feeling.  If you are feeling the baby move.  If you have had any abnormal symptoms, such as leaking fluid, bleeding, severe headaches, or abdominal cramping.  If you have any questions. Other tests or screenings that may be performed during your third trimester include:  Blood tests that check for low iron levels (anemia).  Fetal testing to check the health, activity level, and growth of the fetus. Testing is done if you have certain medical conditions or if there are problems during the pregnancy. FALSE LABOR You may feel small, irregular contractions that   eventually go away. These are called Braxton Hicks contractions, or false labor. Contractions may last for hours, days, or even weeks before true labor sets in. If contractions come at regular intervals, intensify, or become painful, it is best to be seen by your caregiver.  SIGNS OF LABOR   Menstrual-like cramps.  Contractions that are 5 minutes apart or less.  Contractions that start on the top of the uterus and spread down to the lower abdomen and back.  A sense of increased pelvic pressure or back  pain.  A watery or bloody mucus discharge that comes from the vagina. If you have any of these signs before the 37th week of pregnancy, call your caregiver right away. You need to go to the hospital to get checked immediately. HOME CARE INSTRUCTIONS   Avoid all smoking, herbs, alcohol, and unprescribed drugs. These chemicals affect the formation and growth of the baby.  Follow your caregiver's instructions regarding medicine use. There are medicines that are either safe or unsafe to take during pregnancy.  Exercise only as directed by your caregiver. Experiencing uterine cramps is a good sign to stop exercising.  Continue to eat regular, healthy meals.  Wear a good support bra for breast tenderness.  Do not use hot tubs, steam rooms, or saunas.  Wear your seat belt at all times when driving.  Avoid raw meat, uncooked cheese, cat litter boxes, and soil used by cats. These carry germs that can cause birth defects in the baby.  Take your prenatal vitamins.  Try taking a stool softener (if your caregiver approves) if you develop constipation. Eat more high-fiber foods, such as fresh vegetables or fruit and whole grains. Drink plenty of fluids to keep your urine clear or pale yellow.  Take warm sitz baths to soothe any pain or discomfort caused by hemorrhoids. Use hemorrhoid cream if your caregiver approves.  If you develop varicose veins, wear support hose. Elevate your feet for 15 minutes, 3-4 times a day. Limit salt in your diet.  Avoid heavy lifting, wear low heal shoes, and practice good posture.  Rest a lot with your legs elevated if you have leg cramps or low back pain.  Visit your dentist if you have not gone during your pregnancy. Use a soft toothbrush to brush your teeth and be gentle when you floss.  A sexual relationship may be continued unless your caregiver directs you otherwise.  Do not travel far distances unless it is absolutely necessary and only with the approval  of your caregiver.  Take prenatal classes to understand, practice, and ask questions about the labor and delivery.  Make a trial run to the hospital.  Pack your hospital bag.  Prepare the baby's nursery.  Continue to go to all your prenatal visits as directed by your caregiver. SEEK MEDICAL CARE IF:  You are unsure if you are in labor or if your water has broken.  You have dizziness.  You have mild pelvic cramps, pelvic pressure, or nagging pain in your abdominal area.  You have persistent nausea, vomiting, or diarrhea.  You have a bad smelling vaginal discharge.  You have pain with urination. SEEK IMMEDIATE MEDICAL CARE IF:   You have a fever.  You are leaking fluid from your vagina.  You have spotting or bleeding from your vagina.  You have severe abdominal cramping or pain.  You have rapid weight loss or gain.  You have shortness of breath with chest pain.  You notice sudden or extreme swelling   of your face, hands, ankles, feet, or legs.  You have not felt your baby move in over an hour.  You have severe headaches that do not go away with medicine.  You have vision changes. Document Released: 06/01/2001 Document Revised: 06/12/2013 Document Reviewed: 08/08/2012 Exit[employer] Patient Information 2015 [employer]. This information is not intended to replace advice given to you by your health care provider. Make sure you discuss any questions you have with your health care provider.  

## 2014-08-29 ENCOUNTER — Ambulatory Visit (INDEPENDENT_AMBULATORY_CARE_PROVIDER_SITE_OTHER): Payer: Medicaid Other | Admitting: Obstetrics & Gynecology

## 2014-08-29 ENCOUNTER — Inpatient Hospital Stay (HOSPITAL_COMMUNITY)
Admission: AD | Admit: 2014-08-29 | Discharge: 2014-08-29 | Disposition: A | Discharge status: Home / Self Care | Payer: Medicaid Other | Source: Ambulatory Visit | Attending: Obstetrics & Gynecology | Admitting: Obstetrics & Gynecology

## 2014-08-29 ENCOUNTER — Encounter (HOSPITAL_COMMUNITY): Payer: Self-pay

## 2014-08-29 VITALS — BP 122/78 | HR 91 | Temp 97.9°F | Wt >= 6400 oz

## 2014-08-29 DIAGNOSIS — O10913 Unspecified pre-existing hypertension complicating pregnancy, third trimester: Secondary | ICD-10-CM

## 2014-08-29 DIAGNOSIS — Z3A31 31 weeks gestation of pregnancy: Secondary | ICD-10-CM | POA: Insufficient documentation

## 2014-08-29 DIAGNOSIS — O212 Late vomiting of pregnancy: Secondary | ICD-10-CM | POA: Diagnosis not present

## 2014-08-29 DIAGNOSIS — R110 Nausea: Secondary | ICD-10-CM

## 2014-08-29 DIAGNOSIS — A6000 Herpesviral infection of urogenital system, unspecified: Secondary | ICD-10-CM | POA: Insufficient documentation

## 2014-08-29 DIAGNOSIS — B0089 Other herpesviral infection: Secondary | ICD-10-CM | POA: Insufficient documentation

## 2014-08-29 DIAGNOSIS — O98313 Other infections with a predominantly sexual mode of transmission complicating pregnancy, third trimester: Secondary | ICD-10-CM | POA: Diagnosis not present

## 2014-08-29 DIAGNOSIS — Z3493 Encounter for supervision of normal pregnancy, unspecified, third trimester: Secondary | ICD-10-CM

## 2014-08-29 DIAGNOSIS — R109 Unspecified abdominal pain: Secondary | ICD-10-CM | POA: Diagnosis present

## 2014-08-29 LAB — URINALYSIS, ROUTINE W REFLEX MICROSCOPIC
Bilirubin Urine: NEGATIVE
Glucose, UA: NEGATIVE mg/dL
Ketones, ur: 15 mg/dL — AB
NITRITE: NEGATIVE
Protein, ur: NEGATIVE mg/dL
Urobilinogen, UA: 0.2 mg/dL (ref 0.0–1.0)
pH: 6 (ref 5.0–8.0)

## 2014-08-29 LAB — POCT URINALYSIS DIP (DEVICE)
BILIRUBIN URINE: NEGATIVE
Glucose, UA: NEGATIVE mg/dL
Nitrite: NEGATIVE
PROTEIN: NEGATIVE mg/dL
SPECIFIC GRAVITY, URINE: 1.025 (ref 1.005–1.030)
Urobilinogen, UA: 0.2 mg/dL (ref 0.0–1.0)
pH: 6.5 (ref 5.0–8.0)

## 2014-08-29 LAB — URINE MICROSCOPIC-ADD ON

## 2014-08-29 MED ORDER — ONDANSETRON 8 MG PO TBDP
8.0000 mg | ORAL_TABLET | Freq: Once | ORAL | Status: AC
  Administered 2014-08-29: 8 mg via ORAL
  Filled 2014-08-29: qty 1

## 2014-08-29 MED ORDER — VALACYCLOVIR HCL 1 G PO TABS
1000.0000 mg | ORAL_TABLET | Freq: Three times a day (TID) | ORAL | Status: DC
Start: 2014-08-29 — End: 2014-10-10

## 2014-08-29 MED ORDER — ONDANSETRON 4 MG PO TBDP: 4.0000 mg | ORAL_TABLET | Freq: Three times a day (TID) | ORAL | Status: DC | PRN

## 2014-08-29 NOTE — MAU Note (Signed)
Pelvic pressure,  started about noon, tried to eat, but threw up.  Burning sensation in upper legs, started about the same time

## 2014-08-29 NOTE — Patient Instructions (Addendum)
Herpetic Whitlow  Herpetic whitlow is a painful infection of the hand. It can involve 1 or more fingers. It usually affects the end of the finger. This is caused by the Herpes simplex virus 1 (HSV-1) and herpes simplex virus 2 (HSV-2). It is an occupational risk among health care workers.   Herpetic whitlow is characterized by a starting infection, which may be followed by a problem-free period but with future recurrences. After the initial infection, the virus enters nerve endings and lies dormant in those nerves. The primary infection usually is the most troublesome. Recurrences observed in 20-50% of cases are usually milder and shorter in duration. Once nerves are infected with herpes virus they are thought to contain that virus for the rest of your life.  CAUSES   Males and females are affected equally by herpetic whitlow. In health care workers, infection with HSV-1 is most common. It comes from exposure to infected secretions from the mouths of patients. Herpetic whitlow is started by exposure to infected body fluids. The virus gets in through a break in the skin. This could be any small thing such as a torn cuticle. The virus then invades the skin cells. Signs of infection show in days. In children, HSV-1 is the most likely cause. Infection involving the finger usually is due to finger-sucking or thumb-sucking in patients with herpes infection. Toddlers and preschool children are most likely to engage in thumb-sucking or finger-sucking behavior. They are susceptible to herpetic whitlow if they have herpes infection of the mouth.  SYMPTOMS   · Following exposure, problems usually develop within 2-20 days (incubation period). Sometimes fever and sleepiness are observed. Most often initial symptoms are pain and burning or tingling of the infected digit.  · This usually is followed by redness, swelling. There will be development of rice sized vesicles on a red base over the next 7-10 days.  · These vesicles may  ulcerate or break. They usually contain clear fluid. But the fluid may appear cloudy or bloody. Inflammation of the lymph channels which return the body fluids to the heart and lymph nodes (swollen glands) are common. After 10-14 days, symptoms usually improve. The sores (lesions) crust over and heal.  · The infectious phase is believed to be over at this point. Complete resolution happens over the next 5-7 days.  · Problems from this infection are usually related to secondary infections. Complications may include delayed resolution, bacterial overgrowth. These rarely spread throughout the body with serious consequences.  DIAGNOSIS   The diagnosis is usually easily made on physical exam. Sometimes lab work is needed.  HOME CARE INSTRUCTIONS   · Only take over-the-counter or prescription medicines for pain, discomfort, or fever as directed by your caregiver. Do not use aspirin.  · Do not touch the blisters or pick the scabs. Wash your hands often. Do not touch your eyes, mouth or genital areas without washing your hands first. Do not share towels and washcloths.  · Apply an ice pack to the sore area for discomfort.  · This infection is contagious. Avoid close contact with other people until blisters heal. This can be transferred to both the mouth and the genital area.  · Eat a well-balanced diet.  · This problem can be prevented by use of gloves. Observe fluid precautions if you are handling people.  · In the general adult population, herpetic whitlow is most often from yourself. It is most frequently secondary to infection with HSV-2.  MAKE SURE YOU:   ·   Understand these instructions.  · Will watch your condition.  · Will get help right away if you are not doing well or get worse.  Document Released: 08/28/2002 Document Revised: 10/22/2013 Document Reviewed: 01/25/2008  ExitCare® Patient Information ©2015 [employer]. This information is not intended to replace advice given to you by your health care provider.  Make sure you discuss any questions you have with your health care provider.

## 2014-08-29 NOTE — Progress Notes (Signed)
Pt states she was told by the provider that there was nothing else that she could do about her leg pain

## 2014-08-29 NOTE — Discharge Instructions (Signed)
Hyperemesis Gravidarum °Hyperemesis gravidarum is a severe form of nausea and vomiting that happens during pregnancy. Hyperemesis is worse than morning sickness. It may cause you to have nausea or vomiting all day for many days. It may keep you from eating and drinking enough food and liquids. Hyperemesis usually occurs during the first half (the first 20 weeks) of pregnancy. It often goes away once a woman is in her second half of pregnancy. However, sometimes hyperemesis continues through an entire pregnancy.  °CAUSES  °The cause of this condition is not completely known but is thought to be related to changes in the body's hormones when pregnant. It could be from the high level of the pregnancy hormone or an increase in estrogen in the body.  °SIGNS AND SYMPTOMS  °· Severe nausea and vomiting. °· Nausea that does not go away. °· Vomiting that does not allow you to keep any food down. °· Weight loss and body fluid loss (dehydration). °· Having no desire to eat or not liking food you have previously enjoyed. °DIAGNOSIS  °Your health care provider will do a physical exam and ask you about your symptoms. He or she may also order blood tests and urine tests to make sure something else is not causing the problem.  °TREATMENT  °You may only need medicine to control the problem. If medicines do not control the nausea and vomiting, you will be treated in the hospital to prevent dehydration, increased acid in the blood (acidosis), weight loss, and changes in the electrolytes in your body that may harm the unborn baby (fetus). You may need IV fluids.  °HOME CARE INSTRUCTIONS  °· Only take over-the-counter or prescription medicines as directed by your health care provider. °· Try eating a couple of dry crackers or toast in the morning before getting out of bed. °· Avoid foods and smells that upset your stomach. °· Avoid fatty and spicy foods. °· Eat 5-6 small meals a day. °· Do not drink when eating meals. Drink between  meals. °· For snacks, eat high-protein foods, such as cheese. °· Eat or suck on things that have ginger in them. Ginger helps nausea. °· Avoid food preparation. The smell of food can spoil your appetite. °· Avoid iron pills and iron in your multivitamins until after 3-4 months of being pregnant. However, consult with your health care provider before stopping any prescribed iron pills. °SEEK MEDICAL CARE IF:  °· Your abdominal pain increases. °· You have a severe headache. °· You have vision problems. °· You are losing weight. °SEEK IMMEDIATE MEDICAL CARE IF:  °· You are unable to keep fluids down. °· You vomit blood. °· You have constant nausea and vomiting. °· You have excessive weakness. °· You have extreme thirst. °· You have dizziness or fainting. °· You have a fever or persistent symptoms for more than 2-3 days. °· You have a fever and your symptoms suddenly get worse. °MAKE SURE YOU:  °· Understand these instructions. °· Will watch your condition. °· Will get help right away if you are not doing well or get worse. °Document Released: 06/07/2005 Document Revised: 03/28/2013 Document Reviewed: 01/17/2013 °[employer]® Patient Information ©2015 [employer]. This information is not intended to replace advice given to you by your health care provider. Make sure you discuss any questions you have with your health care provider. ° °

## 2014-08-29 NOTE — MAU Provider Note (Signed)
History     CSN: [id number]  Arrival date and time: 08/29/14 1642   None     Chief Complaint  Patient presents with  . Emesis  . Abdominal Pain   HPI Jillian Reed is a 33yo G2P0010 at 31 weeks and 1 day presenting for abdominal pain since this morning. Went to clinic today, but states her pain was not addressed. Pain located in right pelvic area. Vomited x3 since lunch. Denies sick contacts. Denies fever. . Complains of burning in legs. Complains of pelvic pressure since noon. Requesting water. Continues to note fetal movement. Denies vaginal bleeding or discharge. No contractions today, but notes Braxton Hicks contractions yesterday.  Noted to have blisters and pain in right hand consistent with herpes and given prescription for Valtrex 1000mg  TID for 7 day course.  OB History    Gravida Para Term Preterm AB TAB SAB Ectopic Multiple Living   2    1 1           Past Medical History  Diagnosis Date  . Vaginal Pap smear, abnormal   . Anemia   . Anxiety   . Depression   . GERD (gastroesophageal reflux disease)   . Hypertension     Past Surgical History  Procedure Laterality Date  . External ear surgery      Family History  Problem Relation Age of Onset  . Diabetes Mother   . Hypertension Mother   . Arthritis Mother   . Diabetes Father   . Hypertension Father   . Heart disease Maternal Grandmother   . Heart disease Maternal Grandfather     History  Substance Use Topics  . Smoking status: Never Smoker   . Smokeless tobacco: Never Used  . Alcohol Use: Yes     Comment: occassionally when not pregnant    Allergies:  Allergies  Allergen Reactions  . Percocet [Oxycodone-Acetaminophen] Itching  . Tramadol Itching and Other (See Comments)    Paranoid insomnia    Prescriptions prior to admission  Medication Sig Dispense Refill Last Dose  . acetaminophen (TYLENOL) 325 MG tablet Take 325 mg by mouth every 6 (six) hours as needed for mild pain.   Taking  . aspirin  EC 81 MG tablet Take 1 tablet (81 mg total) by mouth daily. Take after 12 weeks for prevention of preeclampssia later in pregnancy 300 tablet 2 Taking  . Prenatal Vit-Fe Fumarate-FA (PRENATAL VITAMIN PO) Take 1 tablet by mouth daily.    Taking  . valACYclovir (VALTREX) 1000 MG tablet Take 1 tablet (1,000 mg total) by mouth 3 (three) times daily. 21 tablet 2     Review of Systems  Gastrointestinal: Positive for nausea and vomiting.   Physical Exam   Blood pressure 131/97, pulse 111, temperature 99.5 F (37.5 C), resp. rate 18, height 5\' 7"  (1.702 m), weight 182.618 kg (402 lb 9.6 oz), last menstrual period 01/29/2014.  Physical Exam  Constitutional: She appears well-developed. No distress.  Cardiovascular: Normal rate and regular rhythm.  Exam reveals no gallop and no friction rub.   No murmur heard. Respiratory: Effort normal. No respiratory distress. She has no wheezes. She has no rales.  GI: Soft. There is no tenderness.  Musculoskeletal: She exhibits no edema.  Pain palpated in right groin over round ligament  Skin: She is not diaphoretic.  Vesicular rash noted on right hand    MAU Course  Procedures  MDM Urinalysis showed specific gravity moderate leukocytes, trace hemoglobin, many squamous epithelial cells, and few bacteria  Fetal monitor: baseline 150, moderate variability, + accelerations, no decelerations OMM attempted. Cannot get leg in proper position secondary to size of pannus and weight of leg. Will give trial of Zofran for nausea and then give trial of water and crackers. Stable for discharge once tolerating PO.  Assessment and Plan  Stable for discharge. Tolerated PO. Resolution of nausea and vomiting. Handout on nausea in pregnancy given. Handout for OTC medications given.   Jillian Reed 08/29/2014, 5:44 PM   Seen also by me and agree with note Will proceed with supportive care at home Able to tolerate POs Followup as scheduled in clinic (was just seen  today) Jillian Reed, CNM

## 2014-08-29 NOTE — MAU Note (Signed)
Pt stated he stomach has been hurting sinc this morning. Went to doctors appointment and the doctor did not adrwess her pain. She went home and ate lunch and then vomited it back up.. C/o increase ad paina nd burning in her legs.

## 2014-08-29 NOTE — Progress Notes (Signed)
Blisters and pain right hand c/w whitlow, rx valtrex 1000mg  TID 1 week, precautions given

## 2014-09-05 ENCOUNTER — Ambulatory Visit (INDEPENDENT_AMBULATORY_CARE_PROVIDER_SITE_OTHER): Payer: Medicaid Other | Admitting: Family Medicine

## 2014-09-05 VITALS — BP 129/60 | HR 92 | Wt >= 6400 oz

## 2014-09-05 DIAGNOSIS — O99211 Obesity complicating pregnancy, first trimester: Secondary | ICD-10-CM

## 2014-09-05 DIAGNOSIS — O10913 Unspecified pre-existing hypertension complicating pregnancy, third trimester: Secondary | ICD-10-CM

## 2014-09-05 LAB — POCT URINALYSIS DIP (DEVICE)
Bilirubin Urine: NEGATIVE
Glucose, UA: NEGATIVE mg/dL
Ketones, ur: NEGATIVE mg/dL
Nitrite: NEGATIVE
Protein, ur: NEGATIVE mg/dL
Specific Gravity, Urine: 1.025 (ref 1.005–1.030)
Urobilinogen, UA: 0.2 mg/dL (ref 0.0–1.0)
pH: 6.5 (ref 5.0–8.0)

## 2014-09-05 NOTE — Patient Instructions (Signed)
Third Trimester of Pregnancy The third trimester is from week 29 through week 42, months 7 through 9. The third trimester is a time when the fetus is growing rapidly. At the end of the ninth month, the fetus is about 20 inches in length and weighs 6-10 pounds.  BODY CHANGES Your body goes through many changes during pregnancy. The changes vary from woman to woman.   Your weight will continue to increase. You can expect to gain 25-35 pounds (11-16 kg) by the end of the pregnancy.  You may begin to get stretch marks on your hips, abdomen, and breasts.  You may urinate more often because the fetus is moving lower into your pelvis and pressing on your bladder.  You may develop or continue to have heartburn as a result of your pregnancy.  You may develop constipation because certain hormones are causing the muscles that push waste through your intestines to slow down.  You may develop hemorrhoids or swollen, bulging veins (varicose veins).  You may have pelvic pain because of the weight gain and pregnancy hormones relaxing your joints between the bones in your pelvis. Backaches may result from overexertion of the muscles supporting your posture.  You may have changes in your hair. These can include thickening of your hair, rapid growth, and changes in texture. Some women also have hair loss during or after pregnancy, or hair that feels dry or thin. Your hair will most likely return to normal after your baby is born.  Your breasts will continue to grow and be tender. A yellow discharge may leak from your breasts called colostrum.  Your belly button may stick out.  You may feel short of breath because of your expanding uterus.  You may notice the fetus "dropping," or moving lower in your abdomen.  You may have a bloody mucus discharge. This usually occurs a few days to a week before labor begins.  Your cervix becomes thin and soft (effaced) near your due date. WHAT TO EXPECT AT YOUR PRENATAL  EXAMS  You will have prenatal exams every 2 weeks until week 36. Then, you will have weekly prenatal exams. During a routine prenatal visit:  You will be weighed to make sure you and the fetus are growing normally.  Your blood pressure is taken.  Your abdomen will be measured to track your baby's growth.  The fetal heartbeat will be listened to.  Any test results from the previous visit will be discussed.  You may have a cervical check near your due date to see if you have effaced. At around 36 weeks, your caregiver will check your cervix. At the same time, your caregiver will also perform a test on the secretions of the vaginal tissue. This test is to determine if a type of bacteria, Group B streptococcus, is present. Your caregiver will explain this further. Your caregiver may ask you:  What your birth plan is.  How you are feeling.  If you are feeling the baby move.  If you have had any abnormal symptoms, such as leaking fluid, bleeding, severe headaches, or abdominal cramping.  If you have any questions. Other tests or screenings that may be performed during your third trimester include:  Blood tests that check for low iron levels (anemia).  Fetal testing to check the health, activity level, and growth of the fetus. Testing is done if you have certain medical conditions or if there are problems during the pregnancy. FALSE LABOR You may feel small, irregular contractions that   eventually go away. These are called Braxton Hicks contractions, or false labor. Contractions may last for hours, days, or even weeks before true labor sets in. If contractions come at regular intervals, intensify, or become painful, it is best to be seen by your caregiver.  SIGNS OF LABOR   Menstrual-like cramps.  Contractions that are 5 minutes apart or less.  Contractions that start on the top of the uterus and spread down to the lower abdomen and back.  A sense of increased pelvic pressure or back  pain.  A watery or bloody mucus discharge that comes from the vagina. If you have any of these signs before the 37th week of pregnancy, call your caregiver right away. You need to go to the hospital to get checked immediately. HOME CARE INSTRUCTIONS   Avoid all smoking, herbs, alcohol, and unprescribed drugs. These chemicals affect the formation and growth of the baby.  Follow your caregiver's instructions regarding medicine use. There are medicines that are either safe or unsafe to take during pregnancy.  Exercise only as directed by your caregiver. Experiencing uterine cramps is a good sign to stop exercising.  Continue to eat regular, healthy meals.  Wear a good support bra for breast tenderness.  Do not use hot tubs, steam rooms, or saunas.  Wear your seat belt at all times when driving.  Avoid raw meat, uncooked cheese, cat litter boxes, and soil used by cats. These carry germs that can cause birth defects in the baby.  Take your prenatal vitamins.  Try taking a stool softener (if your caregiver approves) if you develop constipation. Eat more high-fiber foods, such as fresh vegetables or fruit and whole grains. Drink plenty of fluids to keep your urine clear or pale yellow.  Take warm sitz baths to soothe any pain or discomfort caused by hemorrhoids. Use hemorrhoid cream if your caregiver approves.  If you develop varicose veins, wear support hose. Elevate your feet for 15 minutes, 3-4 times a day. Limit salt in your diet.  Avoid heavy lifting, wear low heal shoes, and practice good posture.  Rest a lot with your legs elevated if you have leg cramps or low back pain.  Visit your dentist if you have not gone during your pregnancy. Use a soft toothbrush to brush your teeth and be gentle when you floss.  A sexual relationship may be continued unless your caregiver directs you otherwise.  Do not travel far distances unless it is absolutely necessary and only with the approval  of your caregiver.  Take prenatal classes to understand, practice, and ask questions about the labor and delivery.  Make a trial run to the hospital.  Pack your hospital bag.  Prepare the baby's nursery.  Continue to go to all your prenatal visits as directed by your caregiver. SEEK MEDICAL CARE IF:  You are unsure if you are in labor or if your water has broken.  You have dizziness.  You have mild pelvic cramps, pelvic pressure, or nagging pain in your abdominal area.  You have persistent nausea, vomiting, or diarrhea.  You have a bad smelling vaginal discharge.  You have pain with urination. SEEK IMMEDIATE MEDICAL CARE IF:   You have a fever.  You are leaking fluid from your vagina.  You have spotting or bleeding from your vagina.  You have severe abdominal cramping or pain.  You have rapid weight loss or gain.  You have shortness of breath with chest pain.  You notice sudden or extreme swelling   of your face, hands, ankles, feet, or legs.  You have not felt your baby move in over an hour.  You have severe headaches that do not go away with medicine.  You have vision changes. Document Released: 06/01/2001 Document Revised: 06/12/2013 Document Reviewed: 08/08/2012 Exit[employer] Patient Information 2015 [employer]. This information is not intended to replace advice given to you by your health care provider. Make sure you discuss any questions you have with your health care provider.  

## 2014-09-05 NOTE — Progress Notes (Signed)
OBF/NST 

## 2014-09-05 NOTE — Progress Notes (Signed)
NST reactive.   No complaints.  Whitlow improving.  FM and labor precations given.

## 2014-09-09 ENCOUNTER — Ambulatory Visit (HOSPITAL_COMMUNITY)
Admission: RE | Admit: 2014-09-09 | Discharge: 2014-09-09 | Disposition: A | Discharge status: Home / Self Care | Payer: Medicaid Other | Source: Ambulatory Visit | Attending: Family Medicine | Admitting: Family Medicine

## 2014-09-09 ENCOUNTER — Ambulatory Visit (INDEPENDENT_AMBULATORY_CARE_PROVIDER_SITE_OTHER): Payer: Medicaid Other | Admitting: *Deleted

## 2014-09-09 ENCOUNTER — Ambulatory Visit (HOSPITAL_COMMUNITY): Payer: Medicaid Other

## 2014-09-09 VITALS — BP 124/47 | HR 84

## 2014-09-09 DIAGNOSIS — Z3A32 32 weeks gestation of pregnancy: Secondary | ICD-10-CM | POA: Diagnosis not present

## 2014-09-09 DIAGNOSIS — O10913 Unspecified pre-existing hypertension complicating pregnancy, third trimester: Secondary | ICD-10-CM

## 2014-09-09 DIAGNOSIS — Z36 Encounter for antenatal screening of mother: Secondary | ICD-10-CM | POA: Insufficient documentation

## 2014-09-09 DIAGNOSIS — O99213 Obesity complicating pregnancy, third trimester: Secondary | ICD-10-CM | POA: Diagnosis not present

## 2014-09-09 NOTE — Progress Notes (Signed)
NST performed today was reviewed and was found to be reactive.  Continue recommended antenatal testing and prenatal care.  

## 2014-09-09 NOTE — Progress Notes (Signed)
[place] for growth done today.  Repeat AFI scheduled on 3/28

## 2014-09-12 ENCOUNTER — Ambulatory Visit (INDEPENDENT_AMBULATORY_CARE_PROVIDER_SITE_OTHER): Payer: Medicaid Other | Admitting: Obstetrics & Gynecology

## 2014-09-12 VITALS — Wt >= 6400 oz

## 2014-09-12 DIAGNOSIS — O10913 Unspecified pre-existing hypertension complicating pregnancy, third trimester: Secondary | ICD-10-CM

## 2014-09-12 LAB — POCT URINALYSIS DIP (DEVICE)
Bilirubin Urine: NEGATIVE
GLUCOSE, UA: NEGATIVE mg/dL
Ketones, ur: NEGATIVE mg/dL
NITRITE: NEGATIVE
PH: 6.5 (ref 5.0–8.0)
Protein, ur: NEGATIVE mg/dL
Specific Gravity, Urine: 1.02 (ref 1.005–1.030)
UROBILINOGEN UA: 0.2 mg/dL (ref 0.0–1.0)

## 2014-09-12 NOTE — Progress Notes (Signed)
Reactive NST today. Low back pain

## 2014-09-12 NOTE — Patient Instructions (Signed)

## 2014-09-16 ENCOUNTER — Ambulatory Visit (HOSPITAL_COMMUNITY)
Admission: RE | Admit: 2014-09-16 | Discharge: 2014-09-16 | Disposition: A | Discharge status: Home / Self Care | Payer: Medicaid Other | Source: Ambulatory Visit | Attending: Obstetrics & Gynecology | Admitting: Obstetrics & Gynecology

## 2014-09-16 ENCOUNTER — Ambulatory Visit (INDEPENDENT_AMBULATORY_CARE_PROVIDER_SITE_OTHER): Payer: Medicaid Other | Admitting: *Deleted

## 2014-09-16 VITALS — BP 138/71 | HR 81

## 2014-09-16 DIAGNOSIS — O10913 Unspecified pre-existing hypertension complicating pregnancy, third trimester: Secondary | ICD-10-CM

## 2014-09-16 NOTE — Progress Notes (Signed)
AFI in [place] dept today =

## 2014-09-17 DIAGNOSIS — Z3A33 33 weeks gestation of pregnancy: Secondary | ICD-10-CM | POA: Insufficient documentation

## 2014-09-17 DIAGNOSIS — O99210 Obesity complicating pregnancy, unspecified trimester: Secondary | ICD-10-CM | POA: Insufficient documentation

## 2014-09-19 ENCOUNTER — Ambulatory Visit (INDEPENDENT_AMBULATORY_CARE_PROVIDER_SITE_OTHER): Payer: Medicaid Other | Admitting: Physician Assistant

## 2014-09-19 ENCOUNTER — Encounter: Payer: Self-pay | Admitting: Physician Assistant

## 2014-09-19 ENCOUNTER — Other Ambulatory Visit: Payer: Medicaid Other

## 2014-09-19 VITALS — BP 115/61 | HR 88 | Wt >= 6400 oz

## 2014-09-19 DIAGNOSIS — O10913 Unspecified pre-existing hypertension complicating pregnancy, third trimester: Secondary | ICD-10-CM

## 2014-09-19 DIAGNOSIS — O09893 Supervision of other high risk pregnancies, third trimester: Secondary | ICD-10-CM

## 2014-09-19 LAB — POCT URINALYSIS DIP (DEVICE)
BILIRUBIN URINE: NEGATIVE
GLUCOSE, UA: NEGATIVE mg/dL
Ketones, ur: NEGATIVE mg/dL
Nitrite: NEGATIVE
Protein, ur: 30 mg/dL — AB
Specific Gravity, Urine: 1.025 (ref 1.005–1.030)
Urobilinogen, UA: 1 mg/dL (ref 0.0–1.0)
pH: 7 (ref 5.0–8.0)

## 2014-09-19 NOTE — Patient Instructions (Signed)
Third Trimester of Pregnancy The third trimester is from week 29 through week 42, months 7 through 9. The third trimester is a time when the fetus is growing rapidly. At the end of the ninth month, the fetus is about 20 inches in length and weighs 6-10 pounds.  BODY CHANGES Your body goes through many changes during pregnancy. The changes vary from woman to woman.   Your weight will continue to increase. You can expect to gain 25-35 pounds (11-16 kg) by the end of the pregnancy.  You may begin to get stretch marks on your hips, abdomen, and breasts.  You may urinate more often because the fetus is moving lower into your pelvis and pressing on your bladder.  You may develop or continue to have heartburn as a result of your pregnancy.  You may develop constipation because certain hormones are causing the muscles that push waste through your intestines to slow down.  You may develop hemorrhoids or swollen, bulging veins (varicose veins).  You may have pelvic pain because of the weight gain and pregnancy hormones relaxing your joints between the bones in your pelvis. Backaches may result from overexertion of the muscles supporting your posture.  You may have changes in your hair. These can include thickening of your hair, rapid growth, and changes in texture. Some women also have hair loss during or after pregnancy, or hair that feels dry or thin. Your hair will most likely return to normal after your baby is born.  Your breasts will continue to grow and be tender. A yellow discharge may leak from your breasts called colostrum.  Your belly button may stick out.  You may feel short of breath because of your expanding uterus.  You may notice the fetus "dropping," or moving lower in your abdomen.  You may have a bloody mucus discharge. This usually occurs a few days to a week before labor begins.  Your cervix becomes thin and soft (effaced) near your due date. WHAT TO EXPECT AT YOUR PRENATAL  EXAMS  You will have prenatal exams every 2 weeks until week 36. Then, you will have weekly prenatal exams. During a routine prenatal visit:  You will be weighed to make sure you and the fetus are growing normally.  Your blood pressure is taken.  Your abdomen will be measured to track your baby's growth.  The fetal heartbeat will be listened to.  Any test results from the previous visit will be discussed.  You may have a cervical check near your due date to see if you have effaced. At around 36 weeks, your caregiver will check your cervix. At the same time, your caregiver will also perform a test on the secretions of the vaginal tissue. This test is to determine if a type of bacteria, Group B streptococcus, is present. Your caregiver will explain this further. Your caregiver may ask you:  What your birth plan is.  How you are feeling.  If you are feeling the baby move.  If you have had any abnormal symptoms, such as leaking fluid, bleeding, severe headaches, or abdominal cramping.  If you have any questions. Other tests or screenings that may be performed during your third trimester include:  Blood tests that check for low iron levels (anemia).  Fetal testing to check the health, activity level, and growth of the fetus. Testing is done if you have certain medical conditions or if there are problems during the pregnancy. FALSE LABOR You may feel small, irregular contractions that   eventually go away. These are called Braxton Hicks contractions, or false labor. Contractions may last for hours, days, or even weeks before true labor sets in. If contractions come at regular intervals, intensify, or become painful, it is best to be seen by your caregiver.  SIGNS OF LABOR   Menstrual-like cramps.  Contractions that are 5 minutes apart or less.  Contractions that start on the top of the uterus and spread down to the lower abdomen and back.  A sense of increased pelvic pressure or back  pain.  A watery or bloody mucus discharge that comes from the vagina. If you have any of these signs before the 37th week of pregnancy, call your caregiver right away. You need to go to the hospital to get checked immediately. HOME CARE INSTRUCTIONS   Avoid all smoking, herbs, alcohol, and unprescribed drugs. These chemicals affect the formation and growth of the baby.  Follow your caregiver's instructions regarding medicine use. There are medicines that are either safe or unsafe to take during pregnancy.  Exercise only as directed by your caregiver. Experiencing uterine cramps is a good sign to stop exercising.  Continue to eat regular, healthy meals.  Wear a good support bra for breast tenderness.  Do not use hot tubs, steam rooms, or saunas.  Wear your seat belt at all times when driving.  Avoid raw meat, uncooked cheese, cat litter boxes, and soil used by cats. These carry germs that can cause birth defects in the baby.  Take your prenatal vitamins.  Try taking a stool softener (if your caregiver approves) if you develop constipation. Eat more high-fiber foods, such as fresh vegetables or fruit and whole grains. Drink plenty of fluids to keep your urine clear or pale yellow.  Take warm sitz baths to soothe any pain or discomfort caused by hemorrhoids. Use hemorrhoid cream if your caregiver approves.  If you develop varicose veins, wear support hose. Elevate your feet for 15 minutes, 3-4 times a day. Limit salt in your diet.  Avoid heavy lifting, wear low heal shoes, and practice good posture.  Rest a lot with your legs elevated if you have leg cramps or low back pain.  Visit your dentist if you have not gone during your pregnancy. Use a soft toothbrush to brush your teeth and be gentle when you floss.  A sexual relationship may be continued unless your caregiver directs you otherwise.  Do not travel far distances unless it is absolutely necessary and only with the approval  of your caregiver.  Take prenatal classes to understand, practice, and ask questions about the labor and delivery.  Make a trial run to the hospital.  Pack your hospital bag.  Prepare the baby's nursery.  Continue to go to all your prenatal visits as directed by your caregiver. SEEK MEDICAL CARE IF:  You are unsure if you are in labor or if your water has broken.  You have dizziness.  You have mild pelvic cramps, pelvic pressure, or nagging pain in your abdominal area.  You have persistent nausea, vomiting, or diarrhea.  You have a bad smelling vaginal discharge.  You have pain with urination. SEEK IMMEDIATE MEDICAL CARE IF:   You have a fever.  You are leaking fluid from your vagina.  You have spotting or bleeding from your vagina.  You have severe abdominal cramping or pain.  You have rapid weight loss or gain.  You have shortness of breath with chest pain.  You notice sudden or extreme swelling   of your face, hands, ankles, feet, or legs.  You have not felt your baby move in over an hour.  You have severe headaches that do not go away with medicine.  You have vision changes. Document Released: 06/01/2001 Document Revised: 06/12/2013 Document Reviewed: 08/08/2012 Exit[employer] Patient Information 2015 [employer]. This information is not intended to replace advice given to you by your health care provider. Make sure you discuss any questions you have with your health care provider.  

## 2014-09-19 NOTE — Progress Notes (Signed)
10 lb weight gain in 1 week- pt states she has felt very hungry.

## 2014-09-19 NOTE — Progress Notes (Signed)
Jillian Reed is a [redacted]w[redacted]d c/o GERD, pollen allergy, headache, peripheral edema, bp is stable.   Pt endorses good FM, denies LOF, VB, discharge, or dysuria. Consulted pt regarding OTC medication for her reflux and allergy, also on labor precautions. RTC for NST on Monday

## 2014-09-19 NOTE — Progress Notes (Signed)
34 weeks, stable.  NST today.  Endorses FM.  Denies VB, LOF, dysuria.  C/o allergies and reflux. OTC med info given.  Advised for zantac.  Okay to use zyrtec vs allegra prn.   RTC on 09/23/14 for NST.

## 2014-09-23 ENCOUNTER — Ambulatory Visit (INDEPENDENT_AMBULATORY_CARE_PROVIDER_SITE_OTHER): Payer: Medicaid Other | Admitting: *Deleted

## 2014-09-23 ENCOUNTER — Ambulatory Visit (HOSPITAL_COMMUNITY)
Admission: RE | Admit: 2014-09-23 | Discharge: 2014-09-23 | Disposition: A | Discharge status: Home / Self Care | Payer: Medicaid Other | Source: Ambulatory Visit | Attending: Obstetrics & Gynecology | Admitting: Obstetrics & Gynecology

## 2014-09-23 VITALS — BP 144/68 | HR 73

## 2014-09-23 DIAGNOSIS — Z3A34 34 weeks gestation of pregnancy: Secondary | ICD-10-CM | POA: Insufficient documentation

## 2014-09-23 DIAGNOSIS — E669 Obesity, unspecified: Secondary | ICD-10-CM | POA: Diagnosis not present

## 2014-09-23 DIAGNOSIS — Z36 Encounter for antenatal screening of mother: Secondary | ICD-10-CM | POA: Diagnosis present

## 2014-09-23 DIAGNOSIS — O99213 Obesity complicating pregnancy, third trimester: Secondary | ICD-10-CM | POA: Diagnosis not present

## 2014-09-23 DIAGNOSIS — O10013 Pre-existing essential hypertension complicating pregnancy, third trimester: Secondary | ICD-10-CM | POA: Insufficient documentation

## 2014-09-23 DIAGNOSIS — O10913 Unspecified pre-existing hypertension complicating pregnancy, third trimester: Secondary | ICD-10-CM

## 2014-09-23 NOTE — Progress Notes (Signed)
AFI in [place] dept today = 15.1 cm

## 2014-09-26 ENCOUNTER — Ambulatory Visit (INDEPENDENT_AMBULATORY_CARE_PROVIDER_SITE_OTHER): Payer: Medicaid Other | Admitting: Obstetrics & Gynecology

## 2014-09-26 VITALS — BP 134/48 | HR 102 | Wt >= 6400 oz

## 2014-09-26 DIAGNOSIS — O10913 Unspecified pre-existing hypertension complicating pregnancy, third trimester: Secondary | ICD-10-CM | POA: Diagnosis not present

## 2014-09-26 LAB — POCT URINALYSIS DIP (DEVICE)
BILIRUBIN URINE: NEGATIVE
Glucose, UA: NEGATIVE mg/dL
Hgb urine dipstick: NEGATIVE
NITRITE: NEGATIVE
Protein, ur: 30 mg/dL — AB
Specific Gravity, Urine: 1.025 (ref 1.005–1.030)
Urobilinogen, UA: 0.2 mg/dL (ref 0.0–1.0)
pH: 6.5 (ref 5.0–8.0)

## 2014-09-26 NOTE — Progress Notes (Signed)
NST reactive no problems

## 2014-09-26 NOTE — Patient Instructions (Signed)
Third Trimester of Pregnancy The third trimester is from week 29 through week 42, months 7 through 9. The third trimester is a time when the fetus is growing rapidly. At the end of the ninth month, the fetus is about 20 inches in length and weighs 6-10 pounds.  BODY CHANGES Your body goes through many changes during pregnancy. The changes vary from woman to woman.   Your weight will continue to increase. You can expect to gain 25-35 pounds (11-16 kg) by the end of the pregnancy.  You may begin to get stretch marks on your hips, abdomen, and breasts.  You may urinate more often because the fetus is moving lower into your pelvis and pressing on your bladder.  You may develop or continue to have heartburn as a result of your pregnancy.  You may develop constipation because certain hormones are causing the muscles that push waste through your intestines to slow down.  You may develop hemorrhoids or swollen, bulging veins (varicose veins).  You may have pelvic pain because of the weight gain and pregnancy hormones relaxing your joints between the bones in your pelvis. Backaches may result from overexertion of the muscles supporting your posture.  You may have changes in your hair. These can include thickening of your hair, rapid growth, and changes in texture. Some women also have hair loss during or after pregnancy, or hair that feels dry or thin. Your hair will most likely return to normal after your baby is born.  Your breasts will continue to grow and be tender. A yellow discharge may leak from your breasts called colostrum.  Your belly button may stick out.  You may feel short of breath because of your expanding uterus.  You may notice the fetus "dropping," or moving lower in your abdomen.  You may have a bloody mucus discharge. This usually occurs a few days to a week before labor begins.  Your cervix becomes thin and soft (effaced) near your due date. WHAT TO EXPECT AT YOUR PRENATAL  EXAMS  You will have prenatal exams every 2 weeks until week 36. Then, you will have weekly prenatal exams. During a routine prenatal visit:  You will be weighed to make sure you and the fetus are growing normally.  Your blood pressure is taken.  Your abdomen will be measured to track your baby's growth.  The fetal heartbeat will be listened to.  Any test results from the previous visit will be discussed.  You may have a cervical check near your due date to see if you have effaced. At around 36 weeks, your caregiver will check your cervix. At the same time, your caregiver will also perform a test on the secretions of the vaginal tissue. This test is to determine if a type of bacteria, Group B streptococcus, is present. Your caregiver will explain this further. Your caregiver may ask you:  What your birth plan is.  How you are feeling.  If you are feeling the baby move.  If you have had any abnormal symptoms, such as leaking fluid, bleeding, severe headaches, or abdominal cramping.  If you have any questions. Other tests or screenings that may be performed during your third trimester include:  Blood tests that check for low iron levels (anemia).  Fetal testing to check the health, activity level, and growth of the fetus. Testing is done if you have certain medical conditions or if there are problems during the pregnancy. FALSE LABOR You may feel small, irregular contractions that   eventually go away. These are called Braxton Hicks contractions, or false labor. Contractions may last for hours, days, or even weeks before true labor sets in. If contractions come at regular intervals, intensify, or become painful, it is best to be seen by your caregiver.  SIGNS OF LABOR   Menstrual-like cramps.  Contractions that are 5 minutes apart or less.  Contractions that start on the top of the uterus and spread down to the lower abdomen and back.  A sense of increased pelvic pressure or back  pain.  A watery or bloody mucus discharge that comes from the vagina. If you have any of these signs before the 37th week of pregnancy, call your caregiver right away. You need to go to the hospital to get checked immediately. HOME CARE INSTRUCTIONS   Avoid all smoking, herbs, alcohol, and unprescribed drugs. These chemicals affect the formation and growth of the baby.  Follow your caregiver's instructions regarding medicine use. There are medicines that are either safe or unsafe to take during pregnancy.  Exercise only as directed by your caregiver. Experiencing uterine cramps is a good sign to stop exercising.  Continue to eat regular, healthy meals.  Wear a good support bra for breast tenderness.  Do not use hot tubs, steam rooms, or saunas.  Wear your seat belt at all times when driving.  Avoid raw meat, uncooked cheese, cat litter boxes, and soil used by cats. These carry germs that can cause birth defects in the baby.  Take your prenatal vitamins.  Try taking a stool softener (if your caregiver approves) if you develop constipation. Eat more high-fiber foods, such as fresh vegetables or fruit and whole grains. Drink plenty of fluids to keep your urine clear or pale yellow.  Take warm sitz baths to soothe any pain or discomfort caused by hemorrhoids. Use hemorrhoid cream if your caregiver approves.  If you develop varicose veins, wear support hose. Elevate your feet for 15 minutes, 3-4 times a day. Limit salt in your diet.  Avoid heavy lifting, wear low heal shoes, and practice good posture.  Rest a lot with your legs elevated if you have leg cramps or low back pain.  Visit your dentist if you have not gone during your pregnancy. Use a soft toothbrush to brush your teeth and be gentle when you floss.  A sexual relationship may be continued unless your caregiver directs you otherwise.  Do not travel far distances unless it is absolutely necessary and only with the approval  of your caregiver.  Take prenatal classes to understand, practice, and ask questions about the labor and delivery.  Make a trial run to the hospital.  Pack your hospital bag.  Prepare the baby's nursery.  Continue to go to all your prenatal visits as directed by your caregiver. SEEK MEDICAL CARE IF:  You are unsure if you are in labor or if your water has broken.  You have dizziness.  You have mild pelvic cramps, pelvic pressure, or nagging pain in your abdominal area.  You have persistent nausea, vomiting, or diarrhea.  You have a bad smelling vaginal discharge.  You have pain with urination. SEEK IMMEDIATE MEDICAL CARE IF:   You have a fever.  You are leaking fluid from your vagina.  You have spotting or bleeding from your vagina.  You have severe abdominal cramping or pain.  You have rapid weight loss or gain.  You have shortness of breath with chest pain.  You notice sudden or extreme swelling   of your face, hands, ankles, feet, or legs.  You have not felt your baby move in over an hour.  You have severe headaches that do not go away with medicine.  You have vision changes. Document Released: 06/01/2001 Document Revised: 06/12/2013 Document Reviewed: 08/08/2012 Exit[employer] Patient Information 2015 [employer]. This information is not intended to replace advice given to you by your health care provider. Make sure you discuss any questions you have with your health care provider.  

## 2014-09-26 NOTE — Progress Notes (Signed)
Small leuks and trace ketones in urine.

## 2014-09-26 NOTE — Progress Notes (Signed)
OBF/NST Pt feels like she has a sinus infection

## 2014-09-30 ENCOUNTER — Ambulatory Visit (INDEPENDENT_AMBULATORY_CARE_PROVIDER_SITE_OTHER): Payer: Medicaid Other | Admitting: *Deleted

## 2014-09-30 ENCOUNTER — Ambulatory Visit (HOSPITAL_COMMUNITY)
Admission: RE | Admit: 2014-09-30 | Discharge: 2014-09-30 | Disposition: A | Discharge status: Home / Self Care | Payer: Medicaid Other | Source: Ambulatory Visit | Attending: Obstetrics & Gynecology | Admitting: Obstetrics & Gynecology

## 2014-09-30 VITALS — BP 109/67 | HR 87

## 2014-09-30 DIAGNOSIS — O99213 Obesity complicating pregnancy, third trimester: Secondary | ICD-10-CM | POA: Diagnosis not present

## 2014-09-30 DIAGNOSIS — Z3A35 35 weeks gestation of pregnancy: Secondary | ICD-10-CM | POA: Insufficient documentation

## 2014-09-30 DIAGNOSIS — O10913 Unspecified pre-existing hypertension complicating pregnancy, third trimester: Secondary | ICD-10-CM

## 2014-09-30 DIAGNOSIS — O10013 Pre-existing essential hypertension complicating pregnancy, third trimester: Secondary | ICD-10-CM | POA: Insufficient documentation

## 2014-09-30 NOTE — Progress Notes (Signed)
AFI in [place] dept today = 15.5cm.

## 2014-10-01 NOTE — Progress Notes (Signed)
4/11 NST reviewed and reactive 

## 2014-10-03 ENCOUNTER — Ambulatory Visit (INDEPENDENT_AMBULATORY_CARE_PROVIDER_SITE_OTHER): Payer: Medicaid Other | Admitting: Family

## 2014-10-03 VITALS — BP 126/69 | HR 86 | Wt >= 6400 oz

## 2014-10-03 DIAGNOSIS — O10913 Unspecified pre-existing hypertension complicating pregnancy, third trimester: Secondary | ICD-10-CM

## 2014-10-03 LAB — POCT URINALYSIS DIP (DEVICE)
Bilirubin Urine: NEGATIVE
GLUCOSE, UA: NEGATIVE mg/dL
HGB URINE DIPSTICK: NEGATIVE
Ketones, ur: NEGATIVE mg/dL
NITRITE: NEGATIVE
PH: 7 (ref 5.0–8.0)
PROTEIN: NEGATIVE mg/dL
Specific Gravity, Urine: 1.02 (ref 1.005–1.030)
UROBILINOGEN UA: 0.2 mg/dL (ref 0.0–1.0)

## 2014-10-03 LAB — OB RESULTS CONSOLE GC/CHLAMYDIA
CHLAMYDIA, DNA PROBE: NEGATIVE
GC PROBE AMP, GENITAL: NEGATIVE

## 2014-10-03 LAB — OB RESULTS CONSOLE GBS: GBS: NEGATIVE

## 2014-10-03 NOTE — Progress Notes (Signed)
UA resulted small leukocytes 

## 2014-10-03 NOTE — Progress Notes (Signed)
Patient no longer taking valtrex

## 2014-10-03 NOTE — Progress Notes (Signed)
NST- Reactive; Reviewed growth [place] from 09/30/14 (74%ile).  GBS and GC and chlamydia collected. Pt declines taking valtrex since never had genital herpes.  Herpes whitlow on finger.

## 2014-10-03 NOTE — Addendum Note (Signed)
Addended by: Jill Side on: 10/03/2014 03:39 PM   Modules accepted: Orders

## 2014-10-04 ENCOUNTER — Encounter: Payer: Self-pay | Admitting: Family

## 2014-10-04 DIAGNOSIS — O0990 Supervision of high risk pregnancy, unspecified, unspecified trimester: Secondary | ICD-10-CM | POA: Insufficient documentation

## 2014-10-04 LAB — PROTEIN / CREATININE RATIO, URINE
Creatinine, Urine: 159 mg/dL
Protein Creatinine Ratio: 0.14 (ref <0.15)
Total Protein, Urine: 22 mg/dL (ref 5–24)

## 2014-10-04 LAB — GC/CHLAMYDIA PROBE AMP
CT Probe RNA: NEGATIVE
GC Probe RNA: NEGATIVE

## 2014-10-04 NOTE — Progress Notes (Signed)
NST reactive.

## 2014-10-06 LAB — CULTURE, BETA STREP (GROUP B ONLY)

## 2014-10-07 ENCOUNTER — Ambulatory Visit (HOSPITAL_COMMUNITY): Payer: Medicaid Other

## 2014-10-07 ENCOUNTER — Other Ambulatory Visit: Payer: Medicaid Other

## 2014-10-08 ENCOUNTER — Ambulatory Visit (INDEPENDENT_AMBULATORY_CARE_PROVIDER_SITE_OTHER): Payer: Medicaid Other | Admitting: *Deleted

## 2014-10-08 ENCOUNTER — Ambulatory Visit (HOSPITAL_COMMUNITY)
Admission: RE | Admit: 2014-10-08 | Discharge: 2014-10-08 | Disposition: A | Discharge status: Home / Self Care | Payer: Medicaid Other | Source: Ambulatory Visit | Attending: Obstetrics and Gynecology | Admitting: Obstetrics and Gynecology

## 2014-10-08 VITALS — BP 110/61 | HR 96

## 2014-10-08 DIAGNOSIS — Z6841 Body Mass Index (BMI) 40.0 and over, adult: Secondary | ICD-10-CM | POA: Diagnosis not present

## 2014-10-08 DIAGNOSIS — O99213 Obesity complicating pregnancy, third trimester: Secondary | ICD-10-CM | POA: Insufficient documentation

## 2014-10-08 DIAGNOSIS — Z36 Encounter for antenatal screening of mother: Secondary | ICD-10-CM | POA: Diagnosis present

## 2014-10-08 DIAGNOSIS — O10013 Pre-existing essential hypertension complicating pregnancy, third trimester: Secondary | ICD-10-CM | POA: Insufficient documentation

## 2014-10-08 DIAGNOSIS — Z3A36 36 weeks gestation of pregnancy: Secondary | ICD-10-CM | POA: Diagnosis not present

## 2014-10-08 DIAGNOSIS — O10913 Unspecified pre-existing hypertension complicating pregnancy, third trimester: Secondary | ICD-10-CM

## 2014-10-08 NOTE — Progress Notes (Signed)
AFI done in radiology today

## 2014-10-08 NOTE — Progress Notes (Signed)
NST reviewed and reactive.  Itzelle Gains L. Harraway-Smith, M.D., FACOG    

## 2014-10-10 ENCOUNTER — Ambulatory Visit (INDEPENDENT_AMBULATORY_CARE_PROVIDER_SITE_OTHER): Payer: Medicaid Other | Admitting: Family Medicine

## 2014-10-10 VITALS — BP 129/42 | HR 85 | Wt >= 6400 oz

## 2014-10-10 DIAGNOSIS — O10913 Unspecified pre-existing hypertension complicating pregnancy, third trimester: Secondary | ICD-10-CM | POA: Diagnosis present

## 2014-10-10 LAB — POCT URINALYSIS DIP (DEVICE)
Bilirubin Urine: NEGATIVE
Glucose, UA: NEGATIVE mg/dL
KETONES UR: NEGATIVE mg/dL
NITRITE: NEGATIVE
Protein, ur: 30 mg/dL — AB
SPECIFIC GRAVITY, URINE: 1.025 (ref 1.005–1.030)
UROBILINOGEN UA: 0.2 mg/dL (ref 0.0–1.0)
pH: 7 (ref 5.0–8.0)

## 2014-10-10 NOTE — Patient Instructions (Signed)
Preeclampsia and Eclampsia Preeclampsia is a serious condition that develops only during pregnancy. It is also called toxemia of pregnancy. This condition causes high blood pressure along with other symptoms, such as swelling and headaches. These may develop as the condition gets worse. Preeclampsia may occur 20 weeks or later into your pregnancy.  Diagnosing and treating preeclampsia early is very important. If not treated early, it can cause serious problems for you and your baby. One problem it can lead to is eclampsia, which is a condition that causes muscle jerking or shaking (convulsions) in the mother. Delivering your baby is the best treatment for preeclampsia or eclampsia.  RISK FACTORS The cause of preeclampsia is not known. You may be more likely to develop preeclampsia if you have certain risk factors. These include:   Being pregnant for the first time.  Having preeclampsia in a past pregnancy.  Having a family history of preeclampsia.  Having high blood pressure.  Being pregnant with twins or triplets.  Being 35 or older.  Being African American.  Having kidney disease or diabetes.  Having medical conditions such as lupus or blood diseases.  Being very overweight (obese). SIGNS AND SYMPTOMS  The earliest signs of preeclampsia are:  High blood pressure.  Increased protein in your urine. Your health care provider will check for this at every prenatal visit. Other symptoms that can develop include:   Severe headaches.  Sudden weight gain.  Swelling of your hands, face, legs, and feet.  Feeling sick to your stomach (nauseous) and throwing up (vomiting).  Vision problems (blurred or double vision).  Numbness in your face, arms, legs, and feet.  Dizziness.  Slurred speech.  Sensitivity to bright lights.  Abdominal pain. DIAGNOSIS  There are no screening tests for preeclampsia. Your health care provider will ask you about symptoms and check for signs of  preeclampsia during your prenatal visits. You may also have tests, including:  Urine testing.  Blood testing.  Checking your baby's heart rate.  Checking the health of your baby and your placenta using images created with sound waves (ultrasound). TREATMENT  You can work out the best treatment approach together with your health care provider. It is very important to keep all prenatal appointments. If you have an increased risk of preeclampsia, you may need more frequent prenatal exams.  Your health care provider may prescribe bed rest.  You may have to eat as little salt as possible.  You may need to take medicine to lower your blood pressure if the condition does not respond to more conservative measures.  You may need to stay in the hospital if your condition is severe. There, treatment will focus on controlling your blood pressure and fluid retention. You may also need to take medicine to prevent seizures.  If the condition gets worse, your baby may need to be delivered early to protect you and the baby. You may have your labor started with medicine (be induced), or you may have a cesarean delivery.  Preeclampsia usually goes away after the baby is born. HOME CARE INSTRUCTIONS   Only take over-the-counter or prescription medicines as directed by your health care provider.  Lie on your left side while resting. This keeps pressure off your baby.  Elevate your feet while resting.  Get regular exercise. Ask your health care provider what type of exercise is safe for you.  Avoid caffeine and alcohol.  Do not smoke.  Drink 6-8 glasses of water every day.  Eat a balanced diet   that is low in salt. Do not add salt to your food.  Avoid stressful situations as much as possible.  Get plenty of rest and sleep.  Keep all prenatal appointments and tests as scheduled. SEEK MEDICAL CARE IF:  You are gaining more weight than expected.  You have any headaches, abdominal pain, or  nausea.  You are bruising more than usual.  You feel dizzy or light-headed. SEEK IMMEDIATE MEDICAL CARE IF:   You develop sudden or severe swelling anywhere in your body. This usually happens in the legs.  You gain 5 lb (2.3 kg) or more in a week.  You have a severe headache, dizziness, problems with your vision, or confusion.  You have severe abdominal pain.  You have lasting nausea or vomiting.  You have a seizure.  You have trouble moving any part of your body.  You develop numbness in your body.  You have trouble speaking.  You have any abnormal bleeding.  You develop a stiff neck.  You pass out. MAKE SURE YOU:   Understand these instructions.  Will watch your condition.  Will get help right away if you are not doing well or get worse. Document Released: 06/04/2000 Document Revised: 06/12/2013 Document Reviewed: 03/30/2013 [employer] Patient Information 2015 [employer]. This information is not intended to replace advice given to you by your health care provider. Make sure you discuss any questions you have with your health care provider.  Breastfeeding Deciding to breastfeed is one of the best choices you can make for you and your baby. A change in hormones during pregnancy causes your breast tissue to grow and increases the number and size of your milk ducts. These hormones also allow proteins, sugars, and fats from your blood supply to make breast milk in your milk-producing glands. Hormones prevent breast milk from being released before your baby is born as well as prompt milk flow after birth. Once breastfeeding has begun, thoughts of your baby, as well as his or her sucking or crying, can stimulate the release of milk from your milk-producing glands.  BENEFITS OF BREASTFEEDING For Your Baby  Your first milk (colostrum) helps your baby's digestive system function better.   There are antibodies in your milk that help your baby fight off infections.   Your  baby has a lower incidence of asthma, allergies, and sudden infant death syndrome.   The nutrients in breast milk are better for your baby than infant formulas and are designed uniquely for your baby's needs.   Breast milk improves your baby's brain development.   Your baby is less likely to develop other conditions, such as childhood obesity, asthma, or type 2 diabetes mellitus.  For You   Breastfeeding helps to create a very special bond between you and your baby.   Breastfeeding is convenient. Breast milk is always available at the correct temperature and costs nothing.   Breastfeeding helps to burn calories and helps you lose the weight gained during pregnancy.   Breastfeeding makes your uterus contract to its prepregnancy size faster and slows bleeding (lochia) after you give birth.   Breastfeeding helps to lower your risk of developing type 2 diabetes mellitus, osteoporosis, and breast or ovarian cancer later in life. SIGNS THAT YOUR BABY IS HUNGRY Early Signs of Hunger  Increased alertness or activity.  Stretching.  Movement of the head from side to side.  Movement of the head and opening of the mouth when the corner of the mouth or cheek is stroked (rooting).    Increased sucking sounds, smacking lips, cooing, sighing, or squeaking.  Hand-to-mouth movements.  Increased sucking of fingers or hands. Late Signs of Hunger  Fussing.  Intermittent crying. Extreme Signs of Hunger Signs of extreme hunger will require calming and consoling before your baby will be able to breastfeed successfully. Do not wait for the following signs of extreme hunger to occur before you initiate breastfeeding:   Restlessness.  A loud, strong cry.   Screaming. BREASTFEEDING BASICS Breastfeeding Initiation  Find a comfortable place to sit or lie down, with your neck and back well supported.  Place a pillow or rolled up blanket under your baby to bring him or her to the level of  your breast (if you are seated). Nursing pillows are specially designed to help support your arms and your baby while you breastfeed.  Make sure that your baby's abdomen is facing your abdomen.   Gently massage your breast. With your fingertips, massage from your chest wall toward your nipple in a circular motion. This encourages milk flow. You may need to continue this action during the feeding if your milk flows slowly.  Support your breast with 4 fingers underneath and your thumb above your nipple. Make sure your fingers are well away from your nipple and your baby's mouth.   Stroke your baby's lips gently with your finger or nipple.   When your baby's mouth is open wide enough, quickly bring your baby to your breast, placing your entire nipple and as much of the colored area around your nipple (areola) as possible into your baby's mouth.   More areola should be visible above your baby's upper lip than below the lower lip.   Your baby's tongue should be between his or her lower gum and your breast.   Ensure that your baby's mouth is correctly positioned around your nipple (latched). Your baby's lips should create a seal on your breast and be turned out (everted).  It is common for your baby to suck about 2-3 minutes in order to start the flow of breast milk. Latching Teaching your baby how to latch on to your breast properly is very important. An improper latch can cause nipple pain and decreased milk supply for you and poor weight gain in your baby. Also, if your baby is not latched onto your nipple properly, he or she may swallow some air during feeding. This can make your baby fussy. Burping your baby when you switch breasts during the feeding can help to get rid of the air. However, teaching your baby to latch on properly is still the best way to prevent fussiness from swallowing air while breastfeeding. Signs that your baby has successfully latched on to your nipple:    Silent  tugging or silent sucking, without causing you pain.   Swallowing heard between every 3-4 sucks.    Muscle movement above and in front of his or her ears while sucking.  Signs that your baby has not successfully latched on to nipple:   Sucking sounds or smacking sounds from your baby while breastfeeding.  Nipple pain. If you think your baby has not latched on correctly, slip your finger into the corner of your baby's mouth to break the suction and place it between your baby's gums. Attempt breastfeeding initiation again. Signs of Successful Breastfeeding Signs from your baby:   A gradual decrease in the number of sucks or complete cessation of sucking.   Falling asleep.   Relaxation of his or her body.     Retention of a small amount of milk in his or her mouth.   Letting go of your breast by himself or herself. Signs from you:  Breasts that have increased in firmness, weight, and size 1-3 hours after feeding.   Breasts that are softer immediately after breastfeeding.  Increased milk volume, as well as a change in milk consistency and color by the fifth day of breastfeeding.   Nipples that are not sore, cracked, or bleeding. Signs That Your Baby is Getting Enough Milk  Wetting at least 3 diapers in a 24-hour period. The urine should be clear and pale yellow by age 5 days.  At least 3 stools in a 24-hour period by age 5 days. The stool should be soft and yellow.  At least 3 stools in a 24-hour period by age 7 days. The stool should be seedy and yellow.  No loss of weight greater than 10% of birth weight during the first 3 days of age.  Average weight gain of 4-7 ounces (113-198 g) per week after age 4 days.  Consistent daily weight gain by age 5 days, without weight loss after the age of 2 weeks. After a feeding, your baby may spit up a small amount. This is common. BREASTFEEDING FREQUENCY AND DURATION Frequent feeding will help you make more milk and can prevent  sore nipples and breast engorgement. Breastfeed when you feel the need to reduce the fullness of your breasts or when your baby shows signs of hunger. This is called "breastfeeding on demand." Avoid introducing a pacifier to your baby while you are working to establish breastfeeding (the first 4-6 weeks after your baby is born). After this time you may choose to use a pacifier. Research has shown that pacifier use during the first year of a baby's life decreases the risk of sudden infant death syndrome (SIDS). Allow your baby to feed on each breast as long as he or she wants. Breastfeed until your baby is finished feeding. When your baby unlatches or falls asleep while feeding from the first breast, offer the second breast. Because newborns are often sleepy in the first few weeks of life, you may need to awaken your baby to get him or her to feed. Breastfeeding times will vary from baby to baby. However, the following rules can serve as a guide to help you ensure that your baby is properly fed:  Newborns (babies 4 weeks of age or younger) may breastfeed every 1-3 hours.  Newborns should not go longer than 3 hours during the day or 5 hours during the night without breastfeeding.  You should breastfeed your baby a minimum of 8 times in a 24-hour period until you begin to introduce solid foods to your baby at around 6 months of age. BREAST MILK PUMPING Pumping and storing breast milk allows you to ensure that your baby is exclusively fed your breast milk, even at times when you are unable to breastfeed. This is especially important if you are going back to work while you are still breastfeeding or when you are not able to be present during feedings. Your lactation consultant can give you guidelines on how long it is safe to store breast milk.  A breast pump is a machine that allows you to pump milk from your breast into a sterile bottle. The pumped breast milk can then be stored in a refrigerator or freezer.  Some breast pumps are operated by hand, while others use electricity. Ask your lactation consultant which type   will work best for you. Breast pumps can be purchased, but some hospitals and breastfeeding support groups lease breast pumps on a monthly basis. A lactation consultant can teach you how to hand express breast milk, if you prefer not to use a pump.  CARING FOR YOUR BREASTS WHILE YOU BREASTFEED Nipples can become dry, cracked, and sore while breastfeeding. The following recommendations can help keep your breasts moisturized and healthy:  Avoid using soap on your nipples.   Wear a supportive bra. Although not required, special nursing bras and tank tops are designed to allow access to your breasts for breastfeeding without taking off your entire bra or top. Avoid wearing underwire-style bras or extremely tight bras.  Air dry your nipples for 3-4minutes after each feeding.   Use only cotton bra pads to absorb leaked breast milk. Leaking of breast milk between feedings is normal.   Use lanolin on your nipples after breastfeeding. Lanolin helps to maintain your skin's normal moisture barrier. If you use pure lanolin, you do not need to wash it off before feeding your baby again. Pure lanolin is not toxic to your baby. You may also hand express a few drops of breast milk and gently massage that milk into your nipples and allow the milk to air dry. In the first few weeks after giving birth, some women experience extremely full breasts (engorgement). Engorgement can make your breasts feel heavy, warm, and tender to the touch. Engorgement peaks within 3-5 days after you give birth. The following recommendations can help ease engorgement:  Completely empty your breasts while breastfeeding or pumping. You may want to start by applying warm, moist heat (in the shower or with warm water-soaked hand towels) just before feeding or pumping. This increases circulation and helps the milk flow. If your baby  does not completely empty your breasts while breastfeeding, pump any extra milk after he or she is finished.  Wear a snug bra (nursing or regular) or tank top for 1-2 days to signal your body to slightly decrease milk production.  Apply ice packs to your breasts, unless this is too uncomfortable for you.  Make sure that your baby is latched on and positioned properly while breastfeeding. If engorgement persists after 48 hours of following these recommendations, contact your health care provider or a lactation consultant. OVERALL HEALTH CARE RECOMMENDATIONS WHILE BREASTFEEDING  Eat healthy foods. Alternate between meals and snacks, eating 3 of each per day. Because what you eat affects your breast milk, some of the foods may make your baby more irritable than usual. Avoid eating these foods if you are sure that they are negatively affecting your baby.  Drink milk, fruit juice, and water to satisfy your thirst (about 10 glasses a day).   Rest often, relax, and continue to take your prenatal vitamins to prevent fatigue, stress, and anemia.  Continue breast self-awareness checks.  Avoid chewing and smoking tobacco.  Avoid alcohol and drug use. Some medicines that may be harmful to your baby can pass through breast milk. It is important to ask your health care provider before taking any medicine, including all over-the-counter and prescription medicine as well as vitamin and herbal supplements. It is possible to become pregnant while breastfeeding. If birth control is desired, ask your health care provider about options that will be safe for your baby. SEEK MEDICAL CARE IF:   You feel like you want to stop breastfeeding or have become frustrated with breastfeeding.  You have painful breasts or nipples.  Your   nipples are cracked or bleeding.  Your breasts are red, tender, or warm.  You have a swollen area on either breast.  You have a fever or chills.  You have nausea or  vomiting.  You have drainage other than breast milk from your nipples.  Your breasts do not become full before feedings by the fifth day after you give birth.  You feel sad and depressed.  Your baby is too sleepy to eat well.  Your baby is having trouble sleeping.   Your baby is wetting less than 3 diapers in a 24-hour period.  Your baby has less than 3 stools in a 24-hour period.  Your baby's skin or the white part of his or her eyes becomes yellow.   Your baby is not gaining weight by 5 days of age. SEEK IMMEDIATE MEDICAL CARE IF:   Your baby is overly tired (lethargic) and does not want to wake up and feed.  Your baby develops an unexplained fever. Document Released: 06/07/2005 Document Revised: 06/12/2013 Document Reviewed: 11/29/2012 [employer] Patient Information 2015 [employer]. This information is not intended to replace advice given to you by your health care provider. Make sure you discuss any questions you have with your health care provider.  

## 2014-10-10 NOTE — Progress Notes (Signed)
IOL scheduled for 10/23/14 at 0700. Patient advised to arrive to [facility] at 0645.

## 2014-10-10 NOTE — Progress Notes (Signed)
U/S growth on Monday Schedule IOL at 39 wks

## 2014-10-10 NOTE — Progress Notes (Signed)
Large leuks and trace Hgb on Udip

## 2014-10-10 NOTE — Progress Notes (Signed)
OBF/NST Schedule IOL Patient desires cervical exam

## 2014-10-11 ENCOUNTER — Encounter (HOSPITAL_COMMUNITY): Payer: Self-pay

## 2014-10-11 ENCOUNTER — Inpatient Hospital Stay (HOSPITAL_COMMUNITY)
Admission: AD | Admit: 2014-10-11 | Discharge: 2014-10-11 | Disposition: A | Discharge status: Home / Self Care | Payer: Medicaid Other | Source: Ambulatory Visit | Attending: Family Medicine | Admitting: Family Medicine

## 2014-10-11 DIAGNOSIS — Z3A37 37 weeks gestation of pregnancy: Secondary | ICD-10-CM | POA: Insufficient documentation

## 2014-10-11 DIAGNOSIS — M549 Dorsalgia, unspecified: Secondary | ICD-10-CM | POA: Diagnosis present

## 2014-10-11 NOTE — Progress Notes (Signed)
Awaiting call return from Dr. Loreta Ave

## 2014-10-11 NOTE — [facility] Note (Signed)
Pt states here for back pain. Was told baby's head was very low. Intermittent contractions.

## 2014-10-11 NOTE — Discharge Instructions (Signed)
Third Trimester of Pregnancy The third trimester is from week 29 through week 42, months 7 through 9. This trimester is when your unborn baby (fetus) is growing very fast. At the end of the ninth month, the unborn baby is about 20 inches in length. It weighs about 6-10 pounds.  HOME CARE   Avoid all smoking, herbs, and alcohol. Avoid drugs not approved by your doctor.  Only take medicine as told by your doctor. Some medicines are safe and some are not during pregnancy.  Exercise only as told by your doctor. Stop exercising if you start having cramps.  Eat regular, healthy meals.  Wear a good support bra if your breasts are tender.  Do not use hot tubs, steam rooms, or saunas.  Wear your seat belt when driving.  Avoid raw meat, uncooked cheese, and liter boxes and soil used by cats.  Take your prenatal vitamins.  Try taking medicine that helps you poop (stool softener) as needed, and if your doctor approves. Eat more fiber by eating fresh fruit, vegetables, and whole grains. Drink enough fluids to keep your pee (urine) clear or pale yellow.  Take warm water baths (sitz baths) to soothe pain or discomfort caused by hemorrhoids. Use hemorrhoid cream if your doctor approves.  If you have puffy, bulging veins (varicose veins), wear support hose. Raise (elevate) your feet for 15 minutes, 3-4 times a day. Limit salt in your diet.  Avoid heavy lifting, wear low heels, and sit up straight.  Rest with your legs raised if you have leg cramps or low back pain.  Visit your dentist if you have not gone during your pregnancy. Use a soft toothbrush to brush your teeth. Be gentle when you floss.  You can have sex (intercourse) unless your doctor tells you not to.  Do not travel far distances unless you must. Only do so with your doctor's approval.  Take prenatal classes.  Practice driving to the hospital.  Pack your hospital bag.  Prepare the baby's room.  Go to your doctor visits. GET  HELP IF:  You are not sure if you are in labor or if your water has broken.  You are dizzy.  You have mild cramps or pressure in your lower belly (abdominal).  You have a nagging pain in your belly area.  You continue to feel sick to your stomach (nauseous), throw up (vomit), or have watery poop (diarrhea).  You have bad smelling fluid coming from your vagina.  You have pain with peeing (urination). GET HELP RIGHT AWAY IF:   You have a fever.  You are leaking fluid from your vagina.  You are spotting or bleeding from your vagina.  You have severe belly cramping or pain.  You lose or gain weight rapidly.  You have trouble catching your breath and have chest pain.  You notice sudden or extreme puffiness (swelling) of your face, hands, ankles, feet, or legs.  You have not felt the baby move in over an hour.  You have severe headaches that do not go away with medicine.  You have vision changes. Document Released: 09/01/2009 Document Revised: 10/02/2012 Document Reviewed: 08/08/2012 Exit[employer] Patient Information 2015 [employer]. This information is not intended to replace advice given to you by your health care provider. Make sure you discuss any questions you have with your health care provider.  

## 2014-10-14 ENCOUNTER — Ambulatory Visit (INDEPENDENT_AMBULATORY_CARE_PROVIDER_SITE_OTHER): Payer: Medicaid Other | Admitting: *Deleted

## 2014-10-14 ENCOUNTER — Encounter (HOSPITAL_COMMUNITY): Payer: Self-pay | Admitting: *Deleted

## 2014-10-14 ENCOUNTER — Telehealth (HOSPITAL_COMMUNITY): Payer: Self-pay | Admitting: *Deleted

## 2014-10-14 ENCOUNTER — Ambulatory Visit (HOSPITAL_COMMUNITY)
Admission: RE | Admit: 2014-10-14 | Discharge: 2014-10-14 | Disposition: A | Discharge status: Home / Self Care | Payer: Medicaid Other | Source: Ambulatory Visit | Attending: Obstetrics and Gynecology | Admitting: Obstetrics and Gynecology

## 2014-10-14 VITALS — BP 124/66 | HR 81

## 2014-10-14 DIAGNOSIS — Z3A37 37 weeks gestation of pregnancy: Secondary | ICD-10-CM | POA: Diagnosis not present

## 2014-10-14 DIAGNOSIS — O283 Abnormal ultrasonic finding on antenatal screening of mother: Secondary | ICD-10-CM | POA: Insufficient documentation

## 2014-10-14 DIAGNOSIS — Z6841 Body Mass Index (BMI) 40.0 and over, adult: Secondary | ICD-10-CM | POA: Diagnosis not present

## 2014-10-14 DIAGNOSIS — O10013 Pre-existing essential hypertension complicating pregnancy, third trimester: Secondary | ICD-10-CM | POA: Diagnosis not present

## 2014-10-14 DIAGNOSIS — O99213 Obesity complicating pregnancy, third trimester: Secondary | ICD-10-CM | POA: Insufficient documentation

## 2014-10-14 DIAGNOSIS — O10913 Unspecified pre-existing hypertension complicating pregnancy, third trimester: Secondary | ICD-10-CM

## 2014-10-14 NOTE — Progress Notes (Signed)
US done today for growth.  Difficult to monitor FHR for NST today. Per Eden Lathe RDMS, pt had BPP of 8/8 during [place]. Information to be added to [place] report for today.

## 2014-10-14 NOTE — Telephone Encounter (Signed)
Preadmission screen  

## 2014-10-17 ENCOUNTER — Ambulatory Visit (INDEPENDENT_AMBULATORY_CARE_PROVIDER_SITE_OTHER): Payer: Medicaid Other | Admitting: Obstetrics and Gynecology

## 2014-10-17 ENCOUNTER — Encounter: Payer: Self-pay | Admitting: Obstetrics and Gynecology

## 2014-10-17 VITALS — BP 137/73 | HR 85 | Wt >= 6400 oz

## 2014-10-17 DIAGNOSIS — E669 Obesity, unspecified: Secondary | ICD-10-CM

## 2014-10-17 DIAGNOSIS — O99213 Obesity complicating pregnancy, third trimester: Secondary | ICD-10-CM

## 2014-10-17 DIAGNOSIS — O10913 Unspecified pre-existing hypertension complicating pregnancy, third trimester: Secondary | ICD-10-CM | POA: Diagnosis not present

## 2014-10-17 DIAGNOSIS — O0993 Supervision of high risk pregnancy, unspecified, third trimester: Secondary | ICD-10-CM

## 2014-10-17 LAB — POCT URINALYSIS DIP (DEVICE)
BILIRUBIN URINE: NEGATIVE
Glucose, UA: NEGATIVE mg/dL
HGB URINE DIPSTICK: NEGATIVE
Ketones, ur: NEGATIVE mg/dL
NITRITE: NEGATIVE
PROTEIN: NEGATIVE mg/dL
Specific Gravity, Urine: 1.02 (ref 1.005–1.030)
UROBILINOGEN UA: 0.2 mg/dL (ref 0.0–1.0)
pH: 7 (ref 5.0–8.0)

## 2014-10-17 NOTE — Progress Notes (Signed)
Small leuks on Udip 

## 2014-10-17 NOTE — Progress Notes (Signed)
Patient is doing well without complaints. Patient is scheduled for IOL on 5/4. Reviewed induction process with the patient and answered all questions. FM/labor precautions reviewed.  NST reviewed and reactive

## 2014-10-19 ENCOUNTER — Encounter (HOSPITAL_COMMUNITY): Payer: Self-pay

## 2014-10-19 ENCOUNTER — Encounter (HOSPITAL_COMMUNITY): Payer: Self-pay | Admitting: *Deleted

## 2014-10-19 ENCOUNTER — Inpatient Hospital Stay (HOSPITAL_COMMUNITY): Payer: Medicaid Other | Admitting: Anesthesiology

## 2014-10-19 ENCOUNTER — Inpatient Hospital Stay (HOSPITAL_COMMUNITY)
Admission: AD | Admit: 2014-10-19 | Discharge: 2014-10-21 | DRG: 774 | Disposition: A | Discharge status: Home / Self Care | Payer: Medicaid Other | Source: Ambulatory Visit | Attending: Obstetrics and Gynecology | Admitting: Obstetrics and Gynecology

## 2014-10-19 DIAGNOSIS — Z833 Family history of diabetes mellitus: Secondary | ICD-10-CM | POA: Diagnosis not present

## 2014-10-19 DIAGNOSIS — Z3A38 38 weeks gestation of pregnancy: Secondary | ICD-10-CM | POA: Diagnosis present

## 2014-10-19 DIAGNOSIS — O9989 Other specified diseases and conditions complicating pregnancy, childbirth and the puerperium: Secondary | ICD-10-CM | POA: Diagnosis present

## 2014-10-19 DIAGNOSIS — Z8249 Family history of ischemic heart disease and other diseases of the circulatory system: Secondary | ICD-10-CM | POA: Diagnosis not present

## 2014-10-19 DIAGNOSIS — O10913 Unspecified pre-existing hypertension complicating pregnancy, third trimester: Secondary | ICD-10-CM

## 2014-10-19 DIAGNOSIS — K219 Gastro-esophageal reflux disease without esophagitis: Secondary | ICD-10-CM | POA: Diagnosis present

## 2014-10-19 DIAGNOSIS — O1092 Unspecified pre-existing hypertension complicating childbirth: Secondary | ICD-10-CM | POA: Diagnosis not present

## 2014-10-19 DIAGNOSIS — O9962 Diseases of the digestive system complicating childbirth: Secondary | ICD-10-CM | POA: Diagnosis present

## 2014-10-19 DIAGNOSIS — O99214 Obesity complicating childbirth: Secondary | ICD-10-CM | POA: Diagnosis not present

## 2014-10-19 DIAGNOSIS — O1002 Pre-existing essential hypertension complicating childbirth: Principal | ICD-10-CM | POA: Diagnosis present

## 2014-10-19 DIAGNOSIS — O0993 Supervision of high risk pregnancy, unspecified, third trimester: Secondary | ICD-10-CM

## 2014-10-19 DIAGNOSIS — Z6841 Body Mass Index (BMI) 40.0 and over, adult: Secondary | ICD-10-CM

## 2014-10-19 DIAGNOSIS — IMO0001 Reserved for inherently not codable concepts without codable children: Secondary | ICD-10-CM

## 2014-10-19 HISTORY — DX: Unspecified infectious disease: B99.9

## 2014-10-19 HISTORY — DX: Headache, unspecified: R51.9

## 2014-10-19 HISTORY — DX: Headache: R51

## 2014-10-19 LAB — COMPREHENSIVE METABOLIC PANEL
ALT: 11 U/L (ref 0–35)
AST: 12 U/L (ref 0–37)
Albumin: 2.9 g/dL — ABNORMAL LOW (ref 3.5–5.2)
Alkaline Phosphatase: 152 U/L — ABNORMAL HIGH (ref 39–117)
Anion gap: 9 (ref 5–15)
BUN: 8 mg/dL (ref 6–23)
CALCIUM: 9 mg/dL (ref 8.4–10.5)
CO2: 24 mmol/L (ref 19–32)
CREATININE: 0.78 mg/dL (ref 0.50–1.10)
Chloride: 103 mmol/L (ref 96–112)
GFR calc non Af Amer: >90 mL/min (ref >90)
GLUCOSE: 90 mg/dL (ref 70–99)
Potassium: 4 mmol/L (ref 3.5–5.1)
Sodium: 136 mmol/L (ref 135–145)
TOTAL PROTEIN: 7.1 g/dL (ref 6.0–8.3)
Total Bilirubin: 0.3 mg/dL (ref 0.3–1.2)

## 2014-10-19 LAB — CBC
HEMATOCRIT: 38.1 % (ref 36.0–46.0)
Hemoglobin: 12.6 g/dL (ref 12.0–15.0)
MCH: 28.8 pg (ref 26.0–34.0)
MCHC: 33.1 g/dL (ref 30.0–36.0)
MCV: 87.2 fL (ref 78.0–100.0)
PLATELETS: 248 10*3/uL (ref 150–400)
RBC: 4.37 MIL/uL (ref 3.87–5.11)
RDW: 14.3 % (ref 11.5–15.5)
WBC: 10.8 10*3/uL — AB (ref 4.0–10.5)

## 2014-10-19 LAB — PROTEIN / CREATININE RATIO, URINE
CREATININE, URINE: 173 mg/dL
PROTEIN CREATININE RATIO: 0.09 mg/mg{creat} (ref 0.00–0.15)
TOTAL PROTEIN, URINE: 15 mg/dL

## 2014-10-19 LAB — TYPE AND SCREEN
ABO/RH(D): B POS
Antibody Screen: NEGATIVE

## 2014-10-19 LAB — ABO/RH: ABO/RH(D): B POS

## 2014-10-19 MED ORDER — FENTANYL 2.5 MCG/ML BUPIVACAINE 1/10 % EPIDURAL INFUSION (WH - ANES)
14.0000 mL/h | INTRAMUSCULAR | Status: DC | PRN
  Administered 2014-10-19 – 2014-10-20 (×3): 14 mL/h via EPIDURAL
  Filled 2014-10-19 (×2): qty 125

## 2014-10-19 MED ORDER — LACTATED RINGERS IV SOLN
INTRAVENOUS | Status: DC
  Administered 2014-10-19: 22:00:00 via INTRAUTERINE
  Administered 2014-10-19: 300 mL via INTRAUTERINE
  Administered 2014-10-19: 21:00:00 via INTRAUTERINE

## 2014-10-19 MED ORDER — OXYTOCIN BOLUS FROM INFUSION
500.0000 mL | INTRAVENOUS | Status: DC
  Administered 2014-10-20: 500 mL via INTRAVENOUS

## 2014-10-19 MED ORDER — LIDOCAINE HCL (PF) 1 % IJ SOLN
30.0000 mL | INTRAMUSCULAR | Status: DC | PRN
  Filled 2014-10-19: qty 30

## 2014-10-19 MED ORDER — EPHEDRINE 5 MG/ML INJ
10.0000 mg | INTRAVENOUS | Status: DC | PRN
  Filled 2014-10-19: qty 2

## 2014-10-19 MED ORDER — LACTATED RINGERS IV SOLN
500.0000 mL | INTRAVENOUS | Status: DC | PRN
  Administered 2014-10-19: 500 mL via INTRAVENOUS

## 2014-10-19 MED ORDER — FENTANYL CITRATE (PF) 100 MCG/2ML IJ SOLN
100.0000 ug | INTRAMUSCULAR | Status: DC | PRN
  Administered 2014-10-19: 100 ug via INTRAVENOUS
  Filled 2014-10-19: qty 2

## 2014-10-19 MED ORDER — PHENYLEPHRINE 40 MCG/ML (10ML) SYRINGE FOR IV PUSH (FOR BLOOD PRESSURE SUPPORT)
80.0000 ug | PREFILLED_SYRINGE | INTRAVENOUS | Status: AC | PRN
  Administered 2014-10-19 (×3): 80 ug via INTRAVENOUS
  Filled 2014-10-19: qty 20

## 2014-10-19 MED ORDER — PHENYLEPHRINE 40 MCG/ML (10ML) SYRINGE FOR IV PUSH (FOR BLOOD PRESSURE SUPPORT)
PREFILLED_SYRINGE | INTRAVENOUS | Status: AC
  Administered 2014-10-19: 80 ug
  Filled 2014-10-19: qty 10

## 2014-10-19 MED ORDER — DIPHENHYDRAMINE HCL 50 MG/ML IJ SOLN
12.5000 mg | INTRAMUSCULAR | Status: DC | PRN
  Administered 2014-10-20: 12.5 mg via INTRAVENOUS
  Filled 2014-10-19: qty 1

## 2014-10-19 MED ORDER — ONDANSETRON HCL 4 MG/2ML IJ SOLN: 4.0000 mg | Freq: Four times a day (QID) | INTRAMUSCULAR | Status: DC | PRN

## 2014-10-19 MED ORDER — PHENYLEPHRINE 40 MCG/ML (10ML) SYRINGE FOR IV PUSH (FOR BLOOD PRESSURE SUPPORT)
PREFILLED_SYRINGE | INTRAVENOUS | Status: AC
  Filled 2014-10-19: qty 10

## 2014-10-19 MED ORDER — OXYTOCIN 40 UNITS IN LACTATED RINGERS INFUSION - SIMPLE MED
1.0000 m[IU]/min | INTRAVENOUS | Status: DC
  Administered 2014-10-19: 2 m[IU]/min via INTRAVENOUS
  Filled 2014-10-19: qty 1000

## 2014-10-19 MED ORDER — CITRIC ACID-SODIUM CITRATE 334-500 MG/5ML PO SOLN: 30.0000 mL | ORAL | Status: DC | PRN

## 2014-10-19 MED ORDER — OXYTOCIN 40 UNITS IN LACTATED RINGERS INFUSION - SIMPLE MED: 62.5000 mL/h | INTRAVENOUS | Status: DC

## 2014-10-19 MED ORDER — TERBUTALINE SULFATE 1 MG/ML IJ SOLN: 0.2500 mg | Freq: Once | INTRAMUSCULAR | Status: AC | PRN

## 2014-10-19 MED ORDER — LACTATED RINGERS IV SOLN
INTRAVENOUS | Status: DC
Start: 2014-10-19 — End: 2014-10-20
  Administered 2014-10-19 (×2): via INTRAVENOUS

## 2014-10-19 NOTE — Progress Notes (Signed)
Patient ID: Jillian Reed, female   DOB: 1980-10-31, 34 y.o.   MRN: [id number] Has been experiencing hypotension from epidural  Then had fetal heart rate decels lasting 60-90 seconds  These improved with position change  AROM clear fluid IUPC and ISE inserted  UCs irregular, every 3-5 min, 64mm per IUPC  Dilation: 4.5 Effacement (%): 70 Cervical Position: Middle Station: -1, -2 Presentation: Vertex Exam by:: jolynn  Will watch FHR  And anesthesia is dealing with hypotension  Will start Pitocin augmentation. Patient agrees

## 2014-10-19 NOTE — H&P (Signed)
Jillian Reed is a 34 y.o. G2P0010 female at [redacted]w[redacted]d by 2nd trimester [place] presenting with spontaneous onset of labor.  Reports active fetal movement, contractions: regular, every 5 minutes, vaginal bleeding: none, membranes: intact. Initiated prenatal care at [facility] at 10 weeks and transferred care to [facility] at 23 wks due to preexisting hypertension.  Most recent u/s 10/15/14. Pregnancy complicated by preexisting hypertension and obesity.  Past Medical History: Past Medical History  Diagnosis Date  . Vaginal Pap smear, abnormal   . Anemia   . Anxiety   . GERD (gastroesophageal reflux disease)   . Hypertension   . Pregnancy induced hypertension   . Headache   . Infection     UTI  . Depression     meds in teens; doing ok now    Past Surgical History: Past Surgical History  Procedure Laterality Date  . External ear surgery    . Therapeutic abortion      Obstetrical History: OB History    Gravida Para Term Preterm AB TAB SAB Ectopic Multiple Living   2    1 1           Social History: History   Social History  . Marital Status: Single    Spouse Name: N/A  . Number of Children: N/A  . Years of Education: N/A   Social History Main Topics  . Smoking status: Never Smoker   . Smokeless tobacco: Never Used  . Alcohol Use: Yes     Comment: occassionally when not pregnant  . Drug Use: No  . Sexual Activity: No   Other Topics Concern  . None   Social History Narrative    Family History: Family History  Problem Relation Age of Onset  . Diabetes Mother   . Hypertension Mother   . Arthritis Mother   . Hyperlipidemia Mother   . Diabetes Father   . Hypertension Father   . Heart disease Maternal Grandmother     Allergies: Allergies  Allergen Reactions  . Percocet [Oxycodone-Acetaminophen] Itching  . Tramadol  Itching and Other (See Comments)    Causes Paranoia and insomnia    Prescriptions prior to admission  Medication Sig Dispense Refill Last Dose  . acetaminophen (TYLENOL) 325 MG tablet Take 325 mg by mouth every 6 (six) hours as needed for mild pain.   Past Week at Unknown time  . aspirin EC 81 MG tablet Take 1 tablet (81 mg total) by mouth daily. Take after 12 weeks for prevention of preeclampssia later in pregnancy 300 tablet 2 Past Week at Unknown time  . cetirizine (ZYRTEC) 1 MG/ML syrup Take 10 mg by mouth as needed (allergies).   Past Week at Unknown time  . Prenatal Vit-Fe Fumarate-FA (PRENATAL VITAMIN PO) Take 1 tablet by mouth daily.    10/18/2014 at Unknown time     Review of Systems  Pertinent pos/neg as indicated in HPI    Blood pressure 136/78, pulse 94, temperature 97.8 F (36.6 C), temperature source Oral, resp. rate 20, height 5\' 7"  (1.702 m), weight 196.861 kg (434 lb), last menstrual period 01/29/2014. General appearance: alert, cooperative and mild distress Lungs: clear to auscultation bilaterally Heart: regular rate and rhythm Abdomen: gravid, soft, non-tender Extremities: 1+ edema DTR's 1+  Fetal monitoring: FHR: 140 bpm, variability: moderate, Accelerations: Present, decelerations: Absent Uterine activity: regular  Dilation: 4.5 Effacement (%): 70 Station: -1, -2 Exam by:: jolynn Presentation: cephalic   Prenatal labs: ABO, Rh: B/Positive/-- (10/26 0000) Antibody: Negative (10/26 0000) Rubella:  RPR: NON REAC (02/09 1430)  HBsAg: Negative (10/26 0000)  HIV: NONREACTIVE (02/09 1430)  GBS: Negative (04/14 0000)   1 hr Glucola: 3rd trimester 154, 3 hour GTT normal (one elevated value) Genetic screening: Neg per pt report Anatomy US: normal  No results found for this or any previous visit (from the past 24 hour(s)).   Assessment: [redacted]w[redacted]d SIUP G2P0010 Spontaneous  labor Cat 1 FHR GBS Negative (04/14 0000)  Plan: Admit to [facility] IV pain meds/epidural prn active labor PIH labs Anticipate NSVD Plans to breastfeed Contraception: Mirena Circumcision: outpatient  Jillian Reed,Jillian Reed SNM 10/19/2014, 2:21 PM  Seen and examined by me also Agree with note  This is a 34 y.o. female at [redacted]w[redacted]d who presents with c/o labor and cervical change. History has been remarkable for preexisting hypertension. Past Medical History  Diagnosis Date  . Vaginal Pap smear, abnormal   . Anemia   . Anxiety   . GERD (gastroesophageal reflux disease)   . Hypertension   . Pregnancy induced hypertension   . Headache   . Infection     UTI  . Depression     meds in teens; doing ok now  See above for surgical and family history. History reviewed.  ROS: Negative for headache, visual changes or upper abdominal pain Negative for chest pain Positive for lower abdominal pain, pressure Negative for nausea, vomiting, diarrhea  Physical Exam: Blood pressure 136/78, pulse 94, temperature 97.8 F (36.6 C), temperature source Oral, resp. rate 20, height  (1.702 m), weight 196.861 kg (434 lb), last menstrual period 01/29/2014. General appearance: alert, cooperative and mild distress with pain. Lungs: clear to auscultation bilaterally, no wheezes Heart: regular rate and rhythm Abdomen: gravid, soft, non-tender, gravid in contour, vertex by SVE Extremities: 1+ edema DTR's 1+, no clonus  Fetal monitoring: FHR: 140 bpm, variability: moderate, Accelerations: Present, decelerations: Absent Uterine activity: regular  Dilation: 4.5 Effacement (%): 70 Station: -1, -2 Exam by:: jolynn Presentation: cephalic  See above for prenatal labs  A: SIUP at [redacted]w[redacted]d   Active labor  Preexisting hypertension with no signs of  preeclampsia  GBS negative  P: Admit to [employer]  Routine orders  Observe for increasing labor  Repeat PIH labs  Aviva Signs, CNM     H&P by Wynelle Bourgeois, copied from MAU provider note  Perry Mount, MD 8:16 PM

## 2014-10-19 NOTE — MAU Provider Note (Signed)
Jillian Reed is a 34 y.o. G2P0010 female at [redacted]w[redacted]d by 2nd trimester [place] presenting with spontaneous onset of labor.   Reports active fetal movement, contractions: regular, every 5 minutes, vaginal bleeding: none, membranes: intact. Initiated prenatal care at [facility] at 10 weeks and transferred care to [facility] at 23 wks due to preexisting hypertension.   Most recent u/s 10/15/14. Pregnancy complicated by preexisting hypertension and obesity.  Past Medical History: Past Medical History  Diagnosis Date  . Vaginal Pap smear, abnormal   . Anemia   . Anxiety   . GERD (gastroesophageal reflux disease)   . Hypertension   . Pregnancy induced hypertension   . Headache   . Infection     UTI  . Depression     meds in teens; doing ok now    Past Surgical History: Past Surgical History  Procedure Laterality Date  . External ear surgery    . Therapeutic abortion      Obstetrical History: OB History    Gravida Para Term Preterm AB TAB SAB Ectopic Multiple Living   2    1 1           Social History: History   Social History  . Marital Status: Single    Spouse Name: N/A  . Number of Children: N/A  . Years of Education: N/A   Social History Main Topics  . Smoking status: Never Smoker   . Smokeless tobacco: Never Used  . Alcohol Use: Yes     Comment: occassionally when not pregnant  . Drug Use: No  . Sexual Activity: No   Other Topics Concern  . None   Social History Narrative    Family History: Family History  Problem Relation Age of Onset  . Diabetes Mother   . Hypertension Mother   . Arthritis Mother   . Hyperlipidemia Mother   . Diabetes Father   . Hypertension Father   . Heart disease Maternal Grandmother     Allergies: Allergies  Allergen Reactions  . Percocet [Oxycodone-Acetaminophen] Itching  . Tramadol Itching and Other (See Comments)    Causes Paranoia and insomnia    Prescriptions prior to admission  Medication Sig Dispense Refill Last Dose  . acetaminophen  (TYLENOL) 325 MG tablet Take 325 mg by mouth every 6 (six) hours as needed for mild pain.   Past Week at Unknown time  . aspirin EC 81 MG tablet Take 1 tablet (81 mg total) by mouth daily. Take after 12 weeks for prevention of preeclampssia later in pregnancy 300 tablet 2 Past Week at Unknown time  . cetirizine (ZYRTEC) 1 MG/ML syrup Take 10 mg by mouth as needed (allergies).   Past Week at Unknown time  . Prenatal Vit-Fe Fumarate-FA (PRENATAL VITAMIN PO) Take 1 tablet by mouth daily.    10/18/2014 at Unknown time     Review of Systems  Pertinent pos/neg as indicated in HPI    Blood pressure 136/78, pulse 94, temperature 97.8 F (36.6 C), temperature source Oral, resp. rate 20, height 5\' 7"  (1.702 m), weight 196.861 kg (434 lb), last menstrual period 01/29/2014. General appearance: alert, cooperative and mild distress Lungs: clear to auscultation bilaterally Heart: regular rate and rhythm Abdomen: gravid, soft, non-tender Extremities: 1+ edema DTR's 1+  Fetal monitoring: FHR: 140 bpm, variability: moderate,  Accelerations: Present,  decelerations:  Absent Uterine activity: regular  Dilation: 4.5 Effacement (%): 70 Station: -1, -2 Exam by:: jolynn Presentation: cephalic   Prenatal labs: ABO, Rh: B/Positive/-- (10/26 0000) Antibody:  Negative (10/26 0000) Rubella:   RPR: NON REAC (02/09 1430)  HBsAg: Negative (10/26 0000)  HIV: NONREACTIVE (02/09 1430)  GBS: Negative (04/14 0000)   1 hr Glucola: 3rd trimester 154, 3 hour GTT normal (one elevated value) Genetic screening:  Neg per pt report Anatomy US: normal  No results found for this or any previous visit (from the past 24 hour(s)).   Assessment:  [redacted]w[redacted]d SIUP  G2P0010  Spontaneous labor  Cat 1 FHR  GBS Negative (04/14 0000)  Plan:  Admit to [facility]  IV pain meds/epidural prn active labor  PIH labs  Anticipate NSVD   Plans to breastfeed  Contraception: Mirena  Circumcision: outpatient  Ross,Lisa Wynne  SNM 10/19/2014, 2:21 PM  Seen and examined by me also Agree with note  This is a 34 y.o. female at [redacted]w[redacted]d who presents with c/o labor and cervical change. History has been remarkable for preexisting hypertension. Past Medical History  Diagnosis Date  . Vaginal Pap smear, abnormal   . Anemia   . Anxiety   . GERD (gastroesophageal reflux disease)   . Hypertension   . Pregnancy induced hypertension   . Headache   . Infection     UTI  . Depression     meds in teens; doing ok now  See above for surgical and family history. History reviewed.  ROS: Negative for headache, visual changes or upper abdominal pain Negative for chest pain Positive for lower abdominal pain, pressure Negative for nausea, vomiting, diarrhea  Physical Exam: Blood pressure 136/78, pulse 94, temperature 97.8 F (36.6 C), temperature source Oral, resp. rate 20, height  (1.702 m), weight 196.861 kg (434 lb), last menstrual period 01/29/2014. General appearance: alert, cooperative and mild distress with pain. Lungs: clear to auscultation bilaterally, no wheezes Heart: regular rate and rhythm Abdomen: gravid, soft, non-tender, gravid in contour, vertex by SVE Extremities: 1+ edema DTR's 1+, no clonus  Fetal monitoring: FHR: 140 bpm, variability: moderate,  Accelerations: Present,  decelerations:  Absent Uterine activity: regular  Dilation: 4.5 Effacement (%): 70 Station: -1, -2 Exam by:: jolynn Presentation: cephalic  See above for prenatal labs  A:  SIUP at [redacted]w[redacted]d       Active labor      Preexisting hypertension with no signs of preeclampsia      GBS negative  P:  Admit to [employer]      Routine orders      Observe for increasing labor      Repeat PIH labs  Aviva Signs, CNM

## 2014-10-19 NOTE — MAU Note (Signed)
Been contracting since last night, now every 5 min. No bleeding or leaking.  Gest hypertension, not on meds.  Denies HA, visual changes or epigastric pain. Was 1+ when last checked

## 2014-10-19 NOTE — Anesthesia Preprocedure Evaluation (Signed)
Anesthesia Evaluation  Patient identified by MRN, date of birth, ID band Patient awake    Reviewed: Allergy & Precautions, H&P , Patient's Chart, lab work & pertinent test results  Airway Mallampati: III  TM Distance: >3 FB Neck ROM: full    Dental no notable dental hx.    Pulmonary neg pulmonary ROS,    Pulmonary exam normal       Cardiovascular     Neuro/Psych    GI/Hepatic Neg liver ROS,   Endo/Other  Morbid obesity  Renal/GU negative Renal ROS     Musculoskeletal   Abdominal (+) + obese,   Peds  Hematology   Anesthesia Other Findings   Reproductive/Obstetrics (+) Pregnancy                             Anesthesia Physical Anesthesia Plan  ASA: III  Anesthesia Plan: Epidural   Post-op Pain Management:    Induction:   Airway Management Planned:   Additional Equipment:   Intra-op Plan:   Post-operative Plan:   Informed Consent: I have reviewed the patients History and Physical, chart, labs and discussed the procedure including the risks, benefits and alternatives for the proposed anesthesia with the patient or authorized representative who has indicated his/her understanding and acceptance.     Plan Discussed with:   Anesthesia Plan Comments:         Anesthesia Quick Evaluation

## 2014-10-20 ENCOUNTER — Encounter (HOSPITAL_COMMUNITY): Payer: Self-pay | Admitting: *Deleted

## 2014-10-20 DIAGNOSIS — Z3A38 38 weeks gestation of pregnancy: Secondary | ICD-10-CM

## 2014-10-20 DIAGNOSIS — O99214 Obesity complicating childbirth: Secondary | ICD-10-CM

## 2014-10-20 DIAGNOSIS — O1092 Unspecified pre-existing hypertension complicating childbirth: Secondary | ICD-10-CM

## 2014-10-20 LAB — RPR: RPR Ser Ql: NONREACTIVE

## 2014-10-20 LAB — HIV ANTIBODY (ROUTINE TESTING W REFLEX): HIV Screen 4th Generation wRfx: NONREACTIVE

## 2014-10-20 MED ORDER — MISOPROSTOL 200 MCG PO TABS
1000.0000 ug | ORAL_TABLET | Freq: Once | ORAL | Status: AC
  Administered 2014-10-20: 1000 ug via RECTAL

## 2014-10-20 MED ORDER — DIPHENHYDRAMINE HCL 25 MG PO CAPS: 25.0000 mg | ORAL_CAPSULE | Freq: Four times a day (QID) | ORAL | Status: DC | PRN

## 2014-10-20 MED ORDER — ACETAMINOPHEN 325 MG PO TABS
650.0000 mg | ORAL_TABLET | ORAL | Status: DC | PRN
  Administered 2014-10-20 – 2014-10-21 (×2): 650 mg via ORAL
  Filled 2014-10-20 (×2): qty 2

## 2014-10-20 MED ORDER — PRENATAL MULTIVITAMIN CH
1.0000 | ORAL_TABLET | Freq: Every day | ORAL | Status: DC
  Administered 2014-10-20 – 2014-10-21 (×2): 1 via ORAL
  Filled 2014-10-20 (×2): qty 1

## 2014-10-20 MED ORDER — BISACODYL 10 MG RE SUPP: 10.0000 mg | Freq: Every day | RECTAL | Status: DC | PRN

## 2014-10-20 MED ORDER — DIBUCAINE 1 % RE OINT: 1.0000 "application " | TOPICAL_OINTMENT | RECTAL | Status: DC | PRN

## 2014-10-20 MED ORDER — SODIUM CHLORIDE 0.9 % IV SOLN: 250.0000 mL | INTRAVENOUS | Status: DC | PRN

## 2014-10-20 MED ORDER — LIDOCAINE HCL (PF) 1 % IJ SOLN
INTRAMUSCULAR | Status: DC | PRN
  Administered 2014-10-19 (×2): 9 mL

## 2014-10-20 MED ORDER — BENZOCAINE-MENTHOL 20-0.5 % EX AERO
1.0000 "application " | INHALATION_SPRAY | CUTANEOUS | Status: DC | PRN
  Administered 2014-10-20: 1 via TOPICAL
  Filled 2014-10-20: qty 56

## 2014-10-20 MED ORDER — MISOPROSTOL 200 MCG PO TABS
ORAL_TABLET | ORAL | Status: AC
  Administered 2014-10-20: 1000 ug via RECTAL
  Filled 2014-10-20: qty 5

## 2014-10-20 MED ORDER — SODIUM CHLORIDE 0.9 % IJ SOLN: 3.0000 mL | INTRAMUSCULAR | Status: DC | PRN

## 2014-10-20 MED ORDER — ZOLPIDEM TARTRATE 5 MG PO TABS: 5.0000 mg | ORAL_TABLET | Freq: Every evening | ORAL | Status: DC | PRN

## 2014-10-20 MED ORDER — ONDANSETRON HCL 4 MG/2ML IJ SOLN: 4.0000 mg | INTRAMUSCULAR | Status: DC | PRN

## 2014-10-20 MED ORDER — FLEET ENEMA 7-19 GM/118ML RE ENEM: 1.0000 | ENEMA | Freq: Every day | RECTAL | Status: DC | PRN

## 2014-10-20 MED ORDER — SENNOSIDES-DOCUSATE SODIUM 8.6-50 MG PO TABS
2.0000 | ORAL_TABLET | ORAL | Status: DC
  Administered 2014-10-20: 2 via ORAL
  Filled 2014-10-20: qty 2

## 2014-10-20 MED ORDER — SIMETHICONE 80 MG PO CHEW: 80.0000 mg | CHEWABLE_TABLET | ORAL | Status: DC | PRN

## 2014-10-20 MED ORDER — SODIUM CHLORIDE 0.9 % IJ SOLN: 3.0000 mL | Freq: Two times a day (BID) | INTRAMUSCULAR | Status: DC

## 2014-10-20 MED ORDER — OXYTOCIN 40 UNITS IN LACTATED RINGERS INFUSION - SIMPLE MED: 62.5000 mL/h | INTRAVENOUS | Status: DC | PRN

## 2014-10-20 MED ORDER — IBUPROFEN 600 MG PO TABS
600.0000 mg | ORAL_TABLET | Freq: Four times a day (QID) | ORAL | Status: DC
Start: 2014-10-20 — End: 2014-10-21
  Administered 2014-10-20 – 2014-10-21 (×5): 600 mg via ORAL
  Filled 2014-10-20 (×5): qty 1

## 2014-10-20 MED ORDER — WITCH HAZEL-GLYCERIN EX PADS: 1.0000 "application " | MEDICATED_PAD | CUTANEOUS | Status: DC | PRN

## 2014-10-20 MED ORDER — ONDANSETRON HCL 4 MG PO TABS: 4.0000 mg | ORAL_TABLET | ORAL | Status: DC | PRN

## 2014-10-20 MED ORDER — LANOLIN HYDROUS EX OINT: TOPICAL_OINTMENT | CUTANEOUS | Status: DC | PRN

## 2014-10-20 NOTE — Progress Notes (Signed)
Patient was referred for history of depression/anxiety. * Referral screened out by Clinical Social Worker because none of the following criteria appear to apply:  ~ History of anxiety/depression during this pregnancy, or of post-partum depression.  ~ Diagnosis of anxiety and/or depression within last 3 years, per chart review.  "Meds in teen years-doing okay now."  ~ History of depression due to pregnancy loss/loss of child  OR * Patient's symptoms currently being treated with medication and/or therapy.  Please contact the Clinical Social Worker if needs arise, or by the patient's request.

## 2014-10-20 NOTE — Progress Notes (Signed)
Labor Progress Note  S: comfortable with epidural  O:  BP 103/67 mmHg  Pulse 86  Temp(Src) 97.4 F (36.3 C) (Oral)  Resp 18  Ht 5\' 7"  (1.702 m)  Wt 434 lb (196.861 kg)  BMI 67.96 kg/m2  SpO2 100%  LMP 01/29/2014 Cat I CVE: 6/70/-2  A&P: 34 y.o. G2P0010 [redacted]w[redacted]d with SOL # cHTN: blood pressures stable # labor: progressing slowly, pelvis adequate on exam, inadequate contraction pattern, continue to increase pitocin  Jasiya Markie ROCIO, MD 12:52 AM

## 2014-10-20 NOTE — Lactation Note (Addendum)
This note was copied from the chart of Jillian Reed. Lactation Consultation Note Initial visit at 12 hours of age.  Mom is breastfeeding baby now.  Mom has large pendulous breast.    Baby has shallow latch on tip of nipple and mom reports pain.  Instructed mom on how to [place] baby. Instructed on how to hold breast and baby for football hold. Instructed on how to get a deep latch.  Assisted with positioning.  Baby opens mouth, but not very wide.  Baby latches with flanged lips and rhythmic sucking for about 2 minutes and then needs stimulation to maintain latch.  Baby on and off a few times to improve latch depth and discomfort.  Instructed mom on looking for round nipple, at one point nipple was compressed on tip when baby slipped to tip of nipple.  [facility] LC resources given and discussed.  Encouraged to feed with early cues on demand.  Early newborn behavior discussed.  Hand expression demonstrated by mom with colostrum visible.  Mom to call for assist as needed.    Patient Name: Jillian Reed [id number] Date: 10/20/2014 Reason for consult: Initial assessment   Maternal Data Has patient been taught Hand Expression?: Yes Does the patient have breastfeeding experience prior to this delivery?: No  Feeding Feeding Type: Breast Milk Length of feed: 15 min  LATCH Score/Interventions Latch: Repeated attempts needed to sustain latch, nipple held in mouth throughout feeding, stimulation needed to elicit sucking reflex. Intervention(s): Adjust position;Assist with latch;Breast massage;Breast compression  Audible Swallowing: A few with stimulation Intervention(s): Skin to skin;Hand expression  Type of Nipple: Everted at rest and after stimulation  Comfort (Breast/Nipple): Soft / non-tender     Hold (Positioning): Assistance needed to correctly position infant at breast and maintain latch. Intervention(s): Breastfeeding basics reviewed;Support Pillows;Position options;Skin to skin  LATCH Score:  7  Lactation Tools Discussed/Used WIC Program: Yes   Consult Status Consult Status: Follow-up Date: 10/21/14 Follow-up type: In-patient    Shoptaw, Arvella Merles 10/20/2014, 7:14 PM

## 2014-10-20 NOTE — Anesthesia Procedure Notes (Signed)
Epidural Patient location during procedure: OB Start time: 10/19/2014 3:18 PM End time: 10/19/2014 3:24 PM  Staffing Anesthesiologist: Leilani Able Performed by: anesthesiologist   Preanesthetic Checklist Completed: patient identified, surgical consent, pre-op evaluation, timeout performed, IV checked, risks and benefits discussed and monitors and equipment checked  Epidural Patient position: sitting Prep: site prepped and draped and DuraPrep Patient monitoring: continuous pulse ox and blood pressure Approach: midline Location: L3-L4 Injection technique: LOR air  Needle:  Needle type: Tuohy  Needle gauge: 17 G Needle length: 9 cm and 9 Needle insertion depth: 9 cm Catheter type: closed end flexible Catheter size: 19 Gauge Catheter at skin depth: 15 cm Test dose: negative and Other  Assessment Sensory level: T9 Events: blood not aspirated, injection not painful, no injection resistance, negative IV test and no paresthesia  Additional Notes Reason for block:procedure for pain

## 2014-10-21 ENCOUNTER — Ambulatory Visit (HOSPITAL_COMMUNITY): Payer: Medicaid Other

## 2014-10-21 ENCOUNTER — Other Ambulatory Visit: Payer: Medicaid Other

## 2014-10-21 MED ORDER — IBUPROFEN 600 MG PO TABS: 600.0000 mg | ORAL_TABLET | Freq: Four times a day (QID) | ORAL | Status: DC

## 2014-10-21 NOTE — Progress Notes (Signed)
Attended 'Well After Birth' group class which presented discharge care information for both Mom and Baby. 

## 2014-10-21 NOTE — Discharge Summary (Signed)
Obstetric Discharge Summary Reason for Admission: induction of labor Prenatal Procedures: ultrasound Intrapartum Procedures: spontaneous vaginal delivery Postpartum Procedures: none Complications-Operative and Postpartum: hypertension HEMOGLOBIN  Date Value Ref Range Status  10/19/2014 12.6 12.0 - 15.0 g/dL Final  82/99/3716 96.7 g/dL Final   HCT  Date Value Ref Range Status  10/19/2014 38.1 36.0 - 46.0 % Final  04/15/2014 35 % Final    Physical Exam:  General: alert, cooperative, appears stated age and no distress Lochia: appropriate Uterine Fundus: firm Incision: n/a DVT Evaluation: No evidence of DVT seen on physical exam. Negative Homan's sign. No cords or calf tenderness.  Discharge Diagnoses: Term Pregnancy-delivered  Discharge Information: Date: 10/21/2014 Activity: pelvic rest Diet: routine Medications: PNV, Ibuprofen and Percocet Condition: stable and improved Instructions: refer to practice specific booklet Discharge to: home   Newborn Data: Live born female  Birth Weight: 7 lb 11.3 oz (3496 g) APGAR: 7, 9  Home with mother.  Jillian Reed 10/21/2014, 6:48 AM

## 2014-10-21 NOTE — Progress Notes (Signed)
UR chart review completed.  

## 2014-10-21 NOTE — Anesthesia Postprocedure Evaluation (Signed)
  Anesthesia Post-op Note  Patient: Jillian Reed  Procedure(s) Performed: * No procedures listed *  Patient Location: Mother/Baby  Anesthesia Type:Epidural  Level of Consciousness: awake, alert , oriented and patient cooperative  Airway and Oxygen Therapy: Patient Spontanous Breathing  Post-op Pain: none  Post-op Assessment: Post-op Vital signs reviewed, Patient's Cardiovascular Status Stable, Respiratory Function Stable, Patent Airway, No headache, No backache, No residual numbness and No residual motor weakness  Post-op Vital Signs: Reviewed and stable  Last Vitals:  Filed Vitals:   10/21/14 0600  BP: 114/59  Pulse: 89  Temp: 37 C  Resp: 21    Complications: No apparent anesthesia complications

## 2014-10-21 NOTE — Addendum Note (Signed)
Addendum  created 10/21/14 0745 by Yolonda Kida, CRNA   Modules edited: Notes Section   Notes Section:  File: [id number]

## 2014-10-23 ENCOUNTER — Inpatient Hospital Stay (HOSPITAL_COMMUNITY): Admission: RE | Admit: 2014-10-23 | Payer: Medicaid Other | Source: Ambulatory Visit

## 2014-10-28 ENCOUNTER — Inpatient Hospital Stay (HOSPITAL_COMMUNITY)
Admission: AD | Admit: 2014-10-28 | Discharge: 2014-10-28 | Disposition: A | Discharge status: Home / Self Care | Payer: Medicaid Other | Source: Ambulatory Visit | Attending: Obstetrics and Gynecology | Admitting: Obstetrics and Gynecology

## 2014-10-28 ENCOUNTER — Encounter (HOSPITAL_COMMUNITY): Payer: Self-pay | Admitting: *Deleted

## 2014-10-28 DIAGNOSIS — I158 Other secondary hypertension: Secondary | ICD-10-CM | POA: Insufficient documentation

## 2014-10-28 DIAGNOSIS — I10 Essential (primary) hypertension: Secondary | ICD-10-CM

## 2014-10-28 DIAGNOSIS — O9089 Other complications of the puerperium, not elsewhere classified: Secondary | ICD-10-CM | POA: Insufficient documentation

## 2014-10-28 DIAGNOSIS — R51 Headache: Secondary | ICD-10-CM | POA: Diagnosis present

## 2014-10-28 LAB — URINALYSIS, ROUTINE W REFLEX MICROSCOPIC
BILIRUBIN URINE: NEGATIVE
Glucose, UA: NEGATIVE mg/dL
KETONES UR: NEGATIVE mg/dL
NITRITE: NEGATIVE
Protein, ur: NEGATIVE mg/dL
Specific Gravity, Urine: 1.01 (ref 1.005–1.030)
UROBILINOGEN UA: 0.2 mg/dL (ref 0.0–1.0)
pH: 7 (ref 5.0–8.0)

## 2014-10-28 LAB — URINE MICROSCOPIC-ADD ON

## 2014-10-28 LAB — CBC WITH DIFFERENTIAL/PLATELET
BASOS ABS: 0 10*3/uL (ref 0.0–0.1)
BASOS PCT: 0 % (ref 0–1)
EOS ABS: 0 10*3/uL (ref 0.0–0.7)
EOS PCT: 1 % (ref 0–5)
HEMATOCRIT: 36.2 % (ref 36.0–46.0)
Hemoglobin: 12.1 g/dL (ref 12.0–15.0)
Lymphocytes Relative: 25 % (ref 12–46)
Lymphs Abs: 2.1 10*3/uL (ref 0.7–4.0)
MCH: 29.1 pg (ref 26.0–34.0)
MCHC: 33.4 g/dL (ref 30.0–36.0)
MCV: 87 fL (ref 78.0–100.0)
MONO ABS: 0.6 10*3/uL (ref 0.1–1.0)
Monocytes Relative: 7 % (ref 3–12)
Neutro Abs: 5.7 10*3/uL (ref 1.7–7.7)
Neutrophils Relative %: 67 % (ref 43–77)
PLATELETS: 287 10*3/uL (ref 150–400)
RBC: 4.16 MIL/uL (ref 3.87–5.11)
RDW: 14.5 % (ref 11.5–15.5)
WBC: 8.5 10*3/uL (ref 4.0–10.5)

## 2014-10-28 LAB — COMPREHENSIVE METABOLIC PANEL
ALT: 13 U/L — ABNORMAL LOW (ref 14–54)
AST: 15 U/L (ref 15–41)
Albumin: 2.8 g/dL — ABNORMAL LOW (ref 3.5–5.0)
Alkaline Phosphatase: 90 U/L (ref 38–126)
Anion gap: 7 (ref 5–15)
BILIRUBIN TOTAL: 0.4 mg/dL (ref 0.3–1.2)
BUN: 10 mg/dL (ref 6–20)
CALCIUM: 8.3 mg/dL — AB (ref 8.9–10.3)
CO2: 25 mmol/L (ref 22–32)
CREATININE: 0.97 mg/dL (ref 0.44–1.00)
Chloride: 110 mmol/L (ref 101–111)
Glucose, Bld: 102 mg/dL — ABNORMAL HIGH (ref 70–99)
Potassium: 3.4 mmol/L — ABNORMAL LOW (ref 3.5–5.1)
Sodium: 142 mmol/L (ref 135–145)
Total Protein: 6.3 g/dL — ABNORMAL LOW (ref 6.5–8.1)

## 2014-10-28 LAB — PROTEIN / CREATININE RATIO, URINE
Creatinine, Urine: 78 mg/dL
Total Protein, Urine: <6 mg/dL

## 2014-10-28 MED ORDER — HYDROCHLOROTHIAZIDE 25 MG PO TABS: 25.0000 mg | ORAL_TABLET | Freq: Every day | ORAL | Status: DC

## 2014-10-28 MED ORDER — BUTALBITAL-APAP-CAFFEINE 50-325-40 MG PO TABS
1.0000 | ORAL_TABLET | Freq: Once | ORAL | Status: AC
  Administered 2014-10-28: 1 via ORAL
  Filled 2014-10-28: qty 1

## 2014-10-28 MED ORDER — LABETALOL HCL 100 MG PO TABS: 100.0000 mg | ORAL_TABLET | Freq: Two times a day (BID) | ORAL | Status: DC

## 2014-10-28 NOTE — Discharge Instructions (Signed)

## 2014-10-28 NOTE — MAU Note (Addendum)
Had SOB for 3 days, migraine for 2 days and cough for 2 days.  Hasn't had an appetite or wanted to drink anything- having to force herself. Got dizzy yesterday.  Had home visit today, instructed to come in .   Vag delivery on Jan 1.

## 2014-10-28 NOTE — MAU Provider Note (Signed)
History     CSN: [id number]  Arrival date and time: 10/28/14 1601   First Provider Initiated Contact with Patient 10/28/14 1649      Chief Complaint  Patient presents with  . Shortness of Breath  . Headache  . Cough   HPI Ms. Jillian Reed is a 34 y.o. G2P1011 who is PPD#8 who presents to MAU today with complaint of headache. All documentation from admission for delivery states patient has chronic HTN. Patient states that it was diagnosed at 12 weeks. She denies previous diagnosis outside of pregnancy. Patient states headache x 2 days. Pain is rated at 8/10 now. She tried Ibuprofen last night without relief. She denies blurred vision, floaters or RUQ pain. She states some epigastric discomfort today. She does endorses intermittent swelling of the lower extremities. She has continued to have PP vaginal bleeding requiring her to change a pad q 2 hours today. She endorses a dizzy episode yesterday.   OB History    Gravida Para Term Preterm AB TAB SAB Ectopic Multiple Living   0 1      Past Medical History  Diagnosis Date  . Vaginal Pap smear, abnormal   . Anemia   . Anxiety   . GERD (gastroesophageal reflux disease)   . Hypertension   . Pregnancy induced hypertension   . Headache   . Infection     UTI  . Depression     meds in teens; doing ok now    Past Surgical History  Procedure Laterality Date  . External ear surgery    . Therapeutic abortion      Family History  Problem Relation Age of Onset  . Diabetes Mother   . Hypertension Mother   . Arthritis Mother   . Hyperlipidemia Mother   . Diabetes Father   . Hypertension Father   . Heart disease Maternal Grandmother     History  Substance Use Topics  . Smoking status: Never Smoker   . Smokeless tobacco: Never Used  . Alcohol Use: Yes     Comment: occassionally when not pregnant    Allergies:  Allergies  Allergen Reactions  . Percocet [Oxycodone-Acetaminophen] Itching  . Tramadol Itching  and Other (See Comments)    Causes Paranoia and insomnia    No prescriptions prior to admission    Review of Systems  Constitutional: Negative for fever and malaise/fatigue.  Eyes: Negative for blurred vision.       Neg - floaters  Cardiovascular: Positive for leg swelling.  Gastrointestinal: Positive for abdominal pain.  Neurological: Positive for headaches.   Physical Exam   Blood pressure 158/84, pulse 85, temperature 98.3 F (36.8 C), resp. rate 20, weight 418 lb 9.6 oz (189.876 kg), last menstrual period 01/29/2014, SpO2 99 %, unknown if currently breastfeeding.  Physical Exam  Nursing note and vitals reviewed. Constitutional: She is oriented to person, place, and time. She appears well-developed and well-nourished. No distress.  HENT:  Head: Normocephalic and atraumatic.  Cardiovascular: Normal rate, regular rhythm and normal heart sounds.   Respiratory: Effort normal and breath sounds normal. No respiratory distress.  GI: Soft. She exhibits no distension and no mass. There is no tenderness. There is no rebound and no guarding.  Musculoskeletal: She exhibits edema (2+ pitting edema of the bilateral feet, 1+ pitting edema of the bilateral lower legs).  Neurological: She is alert and oriented to person, place, and time. She has normal reflexes.  No clonus  Skin: Skin is warm and dry. No erythema.  Psychiatric: She has a normal mood and affect.   Results for orders placed or performed during the hospital encounter of 10/28/14 (from the past 24 hour(s))  CBC with Differential/Platelet     Status: None   Collection Time: 10/28/14  4:56 PM  Result Value Ref Range   WBC 8.5 4.0 - 10.5 K/uL   RBC 4.16 3.87 - 5.11 MIL/uL   Hemoglobin 12.1 12.0 - 15.0 g/dL   HCT 67.2 09.4 - 70.9 %   MCV 87.0 78.0 - 100.0 fL   MCH 29.1 26.0 - 34.0 pg   MCHC 33.4 30.0 - 36.0 g/dL   RDW 62.8 36.6 - 29.4 %   Platelets 287 150 - 400 K/uL   Neutrophils Relative % 67 43 - 77 %   Neutro Abs 5.7  1.7 - 7.7 K/uL   Lymphocytes Relative 25 12 - 46 %   Lymphs Abs 2.1 0.7 - 4.0 K/uL   Monocytes Relative 7 3 - 12 %   Monocytes Absolute 0.6 0.1 - 1.0 K/uL   Eosinophils Relative 1 0 - 5 %   Eosinophils Absolute 0.0 0.0 - 0.7 K/uL   Basophils Relative 0 0 - 1 %   Basophils Absolute 0.0 0.0 - 0.1 K/uL  Comprehensive metabolic panel     Status: Abnormal   Collection Time: 10/28/14  4:56 PM  Result Value Ref Range   Sodium 142 135 - 145 mmol/L   Potassium 3.4 (L) 3.5 - 5.1 mmol/L   Chloride 110 101 - 111 mmol/L   CO2 25 22 - 32 mmol/L   Glucose, Bld 102 (H) 70 - 99 mg/dL   BUN 10 6 - 20 mg/dL   Creatinine, Ser 7.65 0.44 - 1.00 mg/dL   Calcium 8.3 (L) 8.9 - 10.3 mg/dL   Total Protein 6.3 (L) 6.5 - 8.1 g/dL   Albumin 2.8 (L) 3.5 - 5.0 g/dL   AST 15 15 - 41 U/L   ALT 13 (L) 14 - 54 U/L   Alkaline Phosphatase 90 38 - 126 U/L   Total Bilirubin 0.4 0.3 - 1.2 mg/dL   GFR calc non Af Amer >60 >60 mL/min   GFR calc Af Amer >60 >60 mL/min   Anion gap 7 5 - 15  Urinalysis, Routine w reflex microscopic     Status: Abnormal   Collection Time: 10/28/14  5:10 PM  Result Value Ref Range   Color, Urine YELLOW YELLOW   APPearance CLEAR CLEAR   Specific Gravity, Urine 1.010 1.005 - 1.030   pH 7.0 5.0 - 8.0   Glucose, UA NEGATIVE NEGATIVE mg/dL   Hgb urine dipstick MODERATE (A) NEGATIVE   Bilirubin Urine NEGATIVE NEGATIVE   Ketones, ur NEGATIVE NEGATIVE mg/dL   Protein, ur NEGATIVE NEGATIVE mg/dL   Urobilinogen, UA 0.2 0.0 - 1.0 mg/dL   Nitrite NEGATIVE NEGATIVE   Leukocytes, UA SMALL (A) NEGATIVE  Protein / creatinine ratio, urine     Status: None   Collection Time: 10/28/14  5:10 PM  Result Value Ref Range   Creatinine, Urine 78.00 mg/dL   Total Protein, Urine <6 mg/dL   Protein Creatinine Ratio        0.00 - 0.15 mg/mg[Cre]  Urine microscopic-add on     Status: None   Collection Time: 10/28/14  5:10 PM  Result Value Ref Range   Squamous Epithelial / LPF RARE RARE   WBC, UA 3-6 <3  WBC/hpf   RBC /  HPF 0-2 <3 RBC/hpf   Bacteria, UA RARE RARE   Patient Vitals for the past 24 hrs:  BP Temp Pulse Resp SpO2 Weight  10/28/14 1923 158/84 mmHg - 85 20 - -  10/28/14 1808 166/86 mmHg - 84 - - -  10/28/14 1802 (!) 134/119 mmHg - 96 - - -  10/28/14 1747 162/89 mmHg - 84 - - -  10/28/14 1732 165/83 mmHg - 85 - - -  10/28/14 1717 150/86 mmHg - 93 - - -  10/28/14 1702 (!) 142/50 mmHg - 95 - - -  10/28/14 1651 (!) 156/104 mmHg - 100 - - -  10/28/14 1639 - - - - 99 % (!) 418 lb 9.6 oz (189.876 kg)  10/28/14 1637 142/98 mmHg 98.3 F (36.8 C) 95 20 97 % -  10/28/14 1636 - - - - 95 % -    MAU Course  Procedures None  MDM UA, CBC, CMP and Urine protein/creatinine today to R/O pre-eclampsia Serial BPs Patient given Fioricet for headache -  Discussed patient, labs and VS with Dr. Jolayne Panther. She recommends HCTZ 25 mg daily and Labetalol 200 mg BID for [facility] Patient should have BP check in [facility] or by [facility] this Thursday Just prior to discharge patient states she has noted intermittent SOB. O2 sat 99-100% on RA, lungs are clear to auscultation and breath sounds noted in all lung fields.  Patient does not appear in distress. Most likely due to change in weather and/or weight gain in breasts with breastfeeding.  Assessment and Plan  A: PPD #8 Chronic HTN, uncontrolled  P: Discharge home Rx for HCTZ and Labetalol given to patient Warning signs for worsening condition discussed Patient advised to follow-up with Siri Cole RN on Thursday. If [facility] has not contacted patient by Thursday she is advised to call [facility] for an appointment for BP check.  Patient may return to MAU as needed or if her condition were to change or worsen   Marny Lowenstein, PA-C  10/28/2014, 7:36 PM

## 2014-10-28 NOTE — MAU Note (Signed)
Baby is doing well,  Is breast and bottle feeding.

## 2014-10-30 ENCOUNTER — Encounter (HOSPITAL_COMMUNITY): Payer: Self-pay | Admitting: *Deleted

## 2014-10-30 ENCOUNTER — Other Ambulatory Visit: Payer: Self-pay

## 2014-10-30 ENCOUNTER — Emergency Department (HOSPITAL_COMMUNITY): Payer: Medicaid Other

## 2014-10-30 ENCOUNTER — Emergency Department (HOSPITAL_COMMUNITY)
Admission: EM | Admit: 2014-10-30 | Discharge: 2014-10-31 | Disposition: A | Discharge status: Home / Self Care | Payer: Medicaid Other | Attending: Emergency Medicine | Admitting: Emergency Medicine

## 2014-10-30 DIAGNOSIS — O9953 Diseases of the respiratory system complicating the puerperium: Secondary | ICD-10-CM | POA: Insufficient documentation

## 2014-10-30 DIAGNOSIS — J189 Pneumonia, unspecified organism: Secondary | ICD-10-CM | POA: Diagnosis not present

## 2014-10-30 DIAGNOSIS — O9903 Anemia complicating the puerperium: Secondary | ICD-10-CM | POA: Diagnosis not present

## 2014-10-30 DIAGNOSIS — I428 Other cardiomyopathies: Secondary | ICD-10-CM | POA: Diagnosis not present

## 2014-10-30 DIAGNOSIS — Z8659 Personal history of other mental and behavioral disorders: Secondary | ICD-10-CM | POA: Diagnosis not present

## 2014-10-30 DIAGNOSIS — R600 Localized edema: Secondary | ICD-10-CM | POA: Diagnosis not present

## 2014-10-30 DIAGNOSIS — Z8719 Personal history of other diseases of the digestive system: Secondary | ICD-10-CM | POA: Diagnosis not present

## 2014-10-30 DIAGNOSIS — R609 Edema, unspecified: Secondary | ICD-10-CM

## 2014-10-30 DIAGNOSIS — O9089 Other complications of the puerperium, not elsewhere classified: Secondary | ICD-10-CM | POA: Insufficient documentation

## 2014-10-30 DIAGNOSIS — Z79899 Other long term (current) drug therapy: Secondary | ICD-10-CM | POA: Diagnosis not present

## 2014-10-30 DIAGNOSIS — R0602 Shortness of breath: Secondary | ICD-10-CM

## 2014-10-30 DIAGNOSIS — O99345 Other mental disorders complicating the puerperium: Secondary | ICD-10-CM | POA: Diagnosis not present

## 2014-10-30 DIAGNOSIS — F329 Major depressive disorder, single episode, unspecified: Secondary | ICD-10-CM | POA: Diagnosis not present

## 2014-10-30 DIAGNOSIS — O99215 Obesity complicating the puerperium: Secondary | ICD-10-CM | POA: Diagnosis not present

## 2014-10-30 DIAGNOSIS — D649 Anemia, unspecified: Secondary | ICD-10-CM | POA: Insufficient documentation

## 2014-10-30 LAB — CBC WITH DIFFERENTIAL/PLATELET
Basophils Absolute: 0 10*3/uL (ref 0.0–0.1)
Basophils Relative: 0 % (ref 0–1)
Eosinophils Absolute: 0.1 10*3/uL (ref 0.0–0.7)
Eosinophils Relative: 1 % (ref 0–5)
HCT: 41.5 % (ref 36.0–46.0)
Hemoglobin: 13.5 g/dL (ref 12.0–15.0)
Lymphocytes Relative: 21 % (ref 12–46)
Lymphs Abs: 1.9 10*3/uL (ref 0.7–4.0)
MCH: 28.2 pg (ref 26.0–34.0)
MCHC: 32.5 g/dL (ref 30.0–36.0)
MCV: 86.6 fL (ref 78.0–100.0)
Monocytes Absolute: 0.5 10*3/uL (ref 0.1–1.0)
Monocytes Relative: 6 % (ref 3–12)
Neutro Abs: 6.3 10*3/uL (ref 1.7–7.7)
Neutrophils Relative %: 72 % (ref 43–77)
Platelets: 316 10*3/uL (ref 150–400)
RBC: 4.79 MIL/uL (ref 3.87–5.11)
RDW: 14.1 % (ref 11.5–15.5)
WBC: 8.7 10*3/uL (ref 4.0–10.5)

## 2014-10-30 LAB — BASIC METABOLIC PANEL
Anion gap: 12 (ref 5–15)
BUN: <5 mg/dL — ABNORMAL LOW (ref 6–20)
CO2: 26 mmol/L (ref 22–32)
Calcium: 8.7 mg/dL — ABNORMAL LOW (ref 8.9–10.3)
Chloride: 104 mmol/L (ref 101–111)
Creatinine, Ser: 0.99 mg/dL (ref 0.44–1.00)
GFR calc Af Amer: >60 mL/min (ref >60)
GFR calc non Af Amer: >60 mL/min (ref >60)
Glucose, Bld: 99 mg/dL (ref 70–99)
Potassium: 2.9 mmol/L — ABNORMAL LOW (ref 3.5–5.1)
Sodium: 142 mmol/L (ref 135–145)

## 2014-10-30 LAB — TROPONIN I: Troponin I: <0.03 ng/mL (ref <0.031)

## 2014-10-30 LAB — BRAIN NATRIURETIC PEPTIDE: B Natriuretic Peptide: 68.4 pg/mL (ref 0.0–100.0)

## 2014-10-30 MED ORDER — IOHEXOL 350 MG/ML SOLN
100.0000 mL | Freq: Once | INTRAVENOUS | Status: AC | PRN
  Administered 2014-10-30: 100 mL via INTRAVENOUS

## 2014-10-30 MED ORDER — FUROSEMIDE 10 MG/ML IJ SOLN
40.0000 mg | Freq: Once | INTRAMUSCULAR | Status: AC
  Administered 2014-10-30: 40 mg via INTRAVENOUS
  Filled 2014-10-30: qty 4

## 2014-10-30 MED ORDER — AZITHROMYCIN 250 MG PO TABS: 250.0000 mg | ORAL_TABLET | Freq: Every day | ORAL | Status: DC

## 2014-10-30 MED ORDER — POTASSIUM CHLORIDE 10 MEQ/100ML IV SOLN
10.0000 meq | Freq: Once | INTRAVENOUS | Status: AC
  Administered 2014-10-30: 10 meq via INTRAVENOUS
  Filled 2014-10-30: qty 100

## 2014-10-30 MED ORDER — POTASSIUM CHLORIDE CRYS ER 20 MEQ PO TBCR
60.0000 meq | EXTENDED_RELEASE_TABLET | Freq: Once | ORAL | Status: AC
  Administered 2014-10-30: 60 meq via ORAL
  Filled 2014-10-30: qty 3

## 2014-10-30 NOTE — ED Notes (Signed)
Transport arrived to take patient to Vascular. Advised she was in [place]. Transport going to [place] to get patient for Vascular.

## 2014-10-30 NOTE — ED Notes (Signed)
Patient transported to X-ray 

## 2014-10-30 NOTE — ED Notes (Signed)
The pt just delivered vaginally may 1st.  She was back in the hospital Monday for sob since this past Sunday.  She has continued to be sob and this am she woke up with more sob.Marland Kitchen  Swollen legs and feet for 3 months.  lmp recent delivery.  Some lt upper chest pain and radiation up into her lt neck today.

## 2014-10-30 NOTE — Discharge Instructions (Signed)
Please take antibiotic and follow up closely with your doctor for further evaluation of your condition.  Return to ER if your symptoms worsen.   Pneumonia, Adult Pneumonia is an infection of the lungs. It may be caused by a germ (virus or bacteria). Some types of pneumonia can spread easily from person to person. This can happen when you cough or sneeze. HOME CARE  Only take medicine as told by your doctor.  Take your medicine (antibiotics) as told. Finish it even if you start to feel better.  Do not smoke.  You may use a vaporizer or humidifier in your room. This can help loosen thick spit (mucus).  Sleep so you are almost sitting up (semi-upright). This helps reduce coughing.  Rest. A shot (vaccine) can help prevent pneumonia. Shots are often advised for:  People over 50 years old.  Patients on chemotherapy.  People with long-term (chronic) lung problems.  People with immune system problems. GET HELP RIGHT AWAY IF:   You are getting worse.  You cannot control your cough, and you are losing sleep.  You cough up blood.  Your pain gets worse, even with medicine.  You have a fever.  Any of your problems are getting worse, not better.  You have shortness of breath or chest pain. MAKE SURE YOU:   Understand these instructions.  Will watch your condition.  Will get help right away if you are not doing well or get worse. Document Released: 11/24/2007 Document Revised: 08/30/2011 Document Reviewed: 08/28/2010 [facility] Patient Information 2015 [place], [place]. This information is not intended to replace advice given to you by your health care provider. Make sure you discuss any questions you have with your health care provider.

## 2014-10-30 NOTE — Progress Notes (Signed)
*  Preliminary Results* Bilateral lower extremity venous duplex completed. Study was technically difficult and limited due to patient body habitus. Visualized veins of bilateral lower extremities are negative for deep vein thrombosis. There is no evidence of Baker's cyst bilaterally.  10/30/2014  Gertie Fey, RVT, RDCS, RDMS

## 2014-10-30 NOTE — ED Notes (Signed)
Arrived to E-38 at 1720. Assisted to restroom.

## 2014-10-30 NOTE — ED Notes (Signed)
Pt discussed with dr Hyacinth Meeker dopplers ordered

## 2014-10-30 NOTE — ED Notes (Signed)
Patient transported to CT 

## 2014-10-30 NOTE — ED Provider Notes (Signed)
CSN: [id number]     Arrival date & time 10/30/14  1549 History   First MD Initiated Contact with Patient 10/30/14 1708     Chief Complaint  Patient presents with  . Shortness of Breath     (Consider location/radiation/quality/duration/timing/severity/associated sxs/prior Treatment) HPI   34 year old female presents for evaluation of shortness of breath. Patient recently had a vaginal delivery 10 days ago. For the past 4 days she has had persistent shortness of breath worsening with ambulation or laying down. She also endorses a nonproductive cough, and "gurgling in my lung" and for the past 6 hours she has developed persistent chest pain. She described as "elephant sitting on my chest" rate pain as 7 out of 10. She reported having bilateral lower extremities peripheral edema for the past 3 months. Also recently was told that she has high blood pressure, currently taking labetalol. She is currently breast-feeding. She denies any prior history of PE or DVT. She is a nonsmoker. She has spontaneous vaginal delivery. Nursing note also indicated patient had a cerebral aneurysm surgery 3 weeks ago however pt denies having any recent surgery.   Past Medical History  Diagnosis Date  . Vaginal Pap smear, abnormal   . Anemia   . Anxiety   . GERD (gastroesophageal reflux disease)   . Hypertension   . Pregnancy induced hypertension   . Headache   . Infection     UTI  . Depression     meds in teens; doing ok now   Past Surgical History  Procedure Laterality Date  . External ear surgery    . Therapeutic abortion     Family History  Problem Relation Age of Onset  . Diabetes Mother   . Hypertension Mother   . Arthritis Mother   . Hyperlipidemia Mother   . Diabetes Father   . Hypertension Father   . Heart disease Maternal Grandmother    History  Substance Use Topics  . Smoking status: Never Smoker   . Smokeless tobacco: Never Used  . Alcohol Use: Yes     Comment: occassionally when not  pregnant   OB History    Gravida Para Term Preterm AB TAB SAB Ectopic Multiple Living   2 1 1  1 1    0 1     Review of Systems  All other systems reviewed and are negative.     Allergies  Percocet and Tramadol  Home Medications   Prior to Admission medications   Medication Sig Start Date End Date Taking? Authorizing Provider  cetirizine (ZYRTEC) 1 MG/ML syrup Take 10 mg by mouth as needed (allergies).   Yes Historical Provider, MD  hydrochlorothiazide (HYDRODIURIL) 25 MG tablet Take 1 tablet (25 mg total) by mouth daily. 10/28/14  Yes Marny Lowenstein, PA-C  ibuprofen (ADVIL,MOTRIN) 600 MG tablet Take 1 tablet (600 mg total) by mouth every 6 (six) hours. Patient taking differently: Take 600 mg by mouth every 6 (six) hours as needed for moderate pain.  10/21/14  Yes Montez Morita, CNM  labetalol (NORMODYNE) 100 MG tablet Take 1 tablet (100 mg total) by mouth 2 (two) times daily. 10/28/14  Yes Marny Lowenstein, PA-C  Prenatal Vit-Fe Fumarate-FA (PRENATAL VITAMIN PO) Take 1 tablet by mouth daily.    Yes Historical Provider, MD   BP 144/100 mmHg  Pulse 83  Temp(Src) 98.1 F (36.7 C)  Resp 18  Ht 5\' 7"  (1.702 m)  Wt 418 lb (189.604 kg)  BMI 65.45 kg/m2  SpO2 100%  Physical Exam  Constitutional: She is oriented to person, place, and time. She appears well-developed and well-nourished. No distress.  Morbidly obese African-American female nontoxic in appearance  HENT:  Head: Atraumatic.  Eyes: Conjunctivae are normal.  Neck: Neck supple. No JVD present.  Cardiovascular: Normal rate, regular rhythm and intact distal pulses.  Exam reveals no gallop and no friction rub.   No murmur heard. Pulmonary/Chest: She has rales (Faint rales heard at base of lungs. Mild tachypnea but no wheezes or rhonchi.).  Abdominal: Soft. There is no tenderness.  Musculoskeletal: She exhibits edema (2+ pitting edema to bilateral lower extremities without palpable cords or erythema noted.).  Neurological: She is  alert and oriented to person, place, and time.  Skin: No rash noted.  Psychiatric: She has a normal mood and affect.  Nursing note and vitals reviewed.   ED Course  Procedures (including critical care time)  6:46 PM Pt here with bilateral peripheral edema x 3 months and new SOB and CP.  Recent vaginal delivery 10 days ago.  Sxs suggestive of post partum cardiomyopathy. However, given her recent hospitalization,  morbid obesity and chest discomfort i will obtain chest CTA to r/o PE.    Low K+ of 2.9.  Will replenish.  Lasix given for edema.  Her BNP is normal.  Care discussed with Dr. Juleen China.    If no evidence of PE, will consult cardiology to discuss about post partum cardiomyopathy.   10:27 PM Chest CT and she'll shows no evidence of large pulmonary embolus. There is multiple patchy opacity suggestive of multifocal pneumonia or possibly edema. Since patient does not have productive cough or fever, I have low suspicion for pneumonia.  I have consulted with cardiologist who agrees to see pt in ER and will determine dispo and f/u.  Pt ambulate without hypoxia.    11:26 PM Cardiologist Dr. Allena Katz has evaluated pt and also performed bedside US and did not discovered any evidence to suggest post partum cardiomyopathy.  Therefore, plan to discharge pt with close f/u.  Will prescribed abx to cover for possible pneumonia.  Pt currently breast feeding.  Will prescribed Zpak.  Care discussed with Dr. Juleen China.    Author: Gwendolyn Fill Service: Vascular Lab Author Type: Cardiovascular Sonographer   Filed: 10/30/2014 6:03 PM Note Time: 10/30/2014 6:03 PM Status: Signed   Editor: Lawrence Marseilles Simonetti (Cardiovascular Sonographer)     Expand All Collapse All   *Preliminary Results* Bilateral lower extremity venous duplex completed. Study was technically difficult and limited due to patient body habitus. Visualized veins of bilateral lower extremities are negative for deep vein thrombosis. There  is no evidence of Baker's cyst bilaterally.  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 Devlin is a pleasant 34 year old female with recent diagnosis of hypertension who is in the peripartum state after delivering a healthy child about a week ago. She comes to the emergency department with one-week history of progressive shortness of breath, cough. Postpartum patient had developed hypertensive urgency about a week ago and was started on labetolol and HCTZ. She continues to endorses symptoms of shortness of breath and mild chest pressure. Lab data shows BNP of 68 with unremarkable EKG. In addition, CT scan of the chest shows multiple patchy opacities throughout both lungs most consistent with multifocal pneumonia and less likely due to edema. Certainly, immediately after delivery, there should be a concern for peripartum  cardiomyopathy; however, bedside echocardiography suggests preserved biventricular function and normal left sided valvular function. Furthermore, at least for right-sided filling pressures appear to be within normal range. Clinically, patient has trivial to +1 right greater than left pitting edema.  With this data, there is low concern for peripartum cardiomyopathy. Recommend working up other etiologies such as multifocal pneumonia as a cause of her shortness of breath. -- Recommend obtaining an official transthoracic echocardiogram.  Thank you very much for this very interesting consult. Should patient need any further cardiac issues, please do not hesitate to call the on-call cardiology pager at [phone] with any questions.  Labs Review Labs Reviewed  BASIC METABOLIC PANEL - Abnormal; Notable for the following:    Potassium 2.9 (*)    BUN <5 (*)    Calcium 8.7 (*)    All other components within normal limits  BRAIN NATRIURETIC PEPTIDE  TROPONIN I  CBC WITH DIFFERENTIAL/PLATELET    Imaging  Review Dg Chest 2 View  10/30/2014   CLINICAL DATA:  Cough and shortness of breath.  Postpartum.  EXAM: CHEST  2 VIEW  COMPARISON:  04/03/2013  FINDINGS: Heart size is normal. Patchy symmetric airspace disease is seen bilateral with central perihilar predominance, suspicious for mild pulmonary edema. Infection is considered less likely. No evidence of pleural effusion or pneumothorax.  IMPRESSION: Symmetric patchy airspace disease with central perihilar predominance, suspicious for pulmonary edema. Infection is considered less likely be cannot be excluded.   Electronically Signed   By: Myles Rosenthal M.D.   On: 10/30/2014 18:12   Ct Angio Chest Pe W/cm &/or Wo Cm  10/30/2014   CLINICAL DATA:  Shortness of breath.  Acute chest pain.  EXAM: CT ANGIOGRAPHY CHEST WITH CONTRAST  TECHNIQUE: Multidetector CT imaging of the chest was performed using the standard protocol during bolus administration of intravenous contrast. Multiplanar CT image reconstructions and MIPs were obtained to evaluate the vascular anatomy.  CONTRAST:  OMNIPAQUE IOHEXOL 350 MG/ML SOLN  COMPARISON:  Chest radiographs same day.  FINDINGS: No pneumothorax or pleural effusion is noted. Patchy airspace opacities are noted throughout both lungs most consistent with multifocal pneumonia or less likely edema. There is no evidence of thoracic aortic dissection or aneurysm. Visualized portion of upper abdomen appears normal. There is no evidence of large central pulmonary embolus. However, due to body habitus and limited contrast opacification of the peripheral vessels, smaller peripheral pulmonary emboli cannot be excluded on the basis of this exam. No mediastinal mass or adenopathy is noted.  Review of the MIP images confirms the above findings.  IMPRESSION: No large central pulmonary embolus is noted. Due to limited opacification of the peripheral vessels as well as body habitus, smaller peripheral emboli cannot be excluded on the basis of this  exam.  Multiple patchy opacities are noted throughout both lungs most consistent with multifocal pneumonia or possibly edema.   Electronically Signed   By: Lupita Raider, M.D.   On: 10/30/2014 21:31     EKG Interpretation   Date/Time:  Wednesday Oct 30 2014 16:26:08 EDT Ventricular Rate:  88 PR Interval:  146 QRS Duration: 80 QT Interval:  386 QTC Calculation: 467 R Axis:   98 Text Interpretation:  Normal sinus rhythm Rightward axis Borderline ECG No  old tracing to compare Confirmed by MILLER  MD, BRIAN ([id number]) on 10/30/2014  4:30:01 PM      MDM   Final diagnoses:  Shortness of breath  Pneumonia, organism unspecified  Peripheral edema  BP 144/68 mmHg  Pulse 81  Temp(Src) 98.7 F (37.1 C) (Oral)  Resp 24  Ht 5\' 7"  (1.702 m)  Wt 418 lb (189.604 kg)  BMI 65.45 kg/m2  SpO2 96%  I have reviewed nursing notes and vital signs. I personally reviewed the imaging tests through PACS system  I reviewed available ER/hospitalization records thought the EMR     Fayrene Helper, PA-C 10/30/14 2357  Fayrene Helper, PA-C 10/31/14 2355  Raeford Razor, MD 10/31/14 1623

## 2014-10-30 NOTE — Consult Note (Signed)
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 She comes to the emergency department with one-week history of progressive shortness of breath, cough. Postpartum patient had developed hypertensive urgency about a week ago and was started on labetolol and HCTZ. She continues to endorses symptoms of shortness of breath and mild chest pressure. Lab data shows BNP of 68 with unremarkable EKG. In addition, CT scan of the chest shows multiple patchy opacities throughout both lungs most consistent with multifocal pneumonia and less likely due to edema. Certainly, immediately after delivery, there should be a concern for peripartum cardiomyopathy; however, bedside echocardiography suggests preserved biventricular function and normal left sided valvular function. Furthermore, at least for right-sided filling pressures appear to be within normal range. Clinically, patient has trivial to +1 right greater than left pitting edema.  With this data, there is low concern for peripartum cardiomyopathy. Recommend working up other etiologies such as multifocal pneumonia as a cause of her shortness of breath. -- Recommend obtaining an official transthoracic echocardiogram.  Thank you very much for this very interesting consult. Should patient need any further cardiac issues, please do not hesitate to call the on-call cardiology pager at [phone] with any questions. -------------------------------------------------------------------------------------------------------------------------------------------------------------------- Chief complaint: "Shortness of  breath"  HPI:  Jillian Reed is a pleasant 34 year old female with past medical history of hypertension (recently diagnosed) and delivery of a healthy child about a week ago comes to the emergency department with shortness of breath. Cardiology is being consulted to assist with management of shortness of breath and evaluate for peripartum cardiomyopathy. Patient reportedly after delivering was doing fairly well up until about a week ago when she started having headaches with mild shortness of breath. She was evaluated in her local emergency department where she was diagnosed with hypertension and volume overload. She was started on atenolol and diuretic (HCTZ). She continued to have symptoms of progressive shortness of breath and today noticed mild chest pressure; therefore decided to come to the hospital. Currently, she endorses no shortness of breath albeit at rest, orthopnea but endorses mild lower extremity edema. She denies any obvious sick contacts and exposure to patients with URI. She denies any systemic signs of infection such as fevers, diaphoresis, increased sputum production but endorses new dry cough.   Cardiac history:  Postpartum HTN Previous cardiac imaging  EKG: Normal sinus rhythm  Preliminary TTE bedside performed on 10/29/2013: Normal biventricular function with normal mitral and aortic valve function, inferior vena cava with appropriate respiratory variation and collapse with sniffing suggesting normal filling pressures.  NM Stress test: None  Prior cath:  None -------------------------------------------------------------------------------------------------------------------------------------------------------------------- Past Medical History Past Medical History  Diagnosis Date  . Vaginal Pap smear, abnormal   . Anemia   . Anxiety   . GERD (gastroesophageal reflux disease)   . Hypertension   . Pregnancy induced hypertension   . Headache   . Infection     UTI  .  Depression     meds in teens; doing ok now  -------------------------------------------------------------------------------------------------------------------------------------------------------------------- Allergies: Allergies  Allergen Reactions  . Percocet [Oxycodone-Acetaminophen] Itching  . Tramadol Itching and Other (See Comments)    Causes Paranoia and insomnia   -------------------------------------------------------------------------------------------------------------------------------------------------------------------- Social History History   Social History  . Marital Status: Single    Spouse Name: N/A  . Number of Children: N/A  .  Years of Education: N/A   Occupational History  . Not on file.   Social History Main Topics  . Smoking status: Never Smoker   . Smokeless tobacco: Never Used  . Alcohol Use: Yes     Comment: occassionally when not pregnant  . Drug Use: No  . Sexual Activity: No   Other Topics Concern  . Not on file   Social History Narrative   -------------------------------------------------------------------------------------------------------------------------------------------------------------------- Family History Family History  Problem Relation Age of Onset  . Diabetes Mother   . Hypertension Mother   . Arthritis Mother   . Hyperlipidemia Mother   . Diabetes Father   . Hypertension Father   . Heart disease Maternal Grandmother    -------------------------------------------------------------------------------------------------------------------------------------------------------------------- Physical Exam Filed Vitals:   10/30/14 2200  BP: 144/68  Pulse: 81  Temp:   Resp:     Gen: Comfortable appearing in NAD HEENT: EOMI, PERRLA Neck: Supple, difficult to evaluate jugular venous distention due to habitus CV: RRR, Normal S1,S2. II/VI holosystolic (mid peaking) murmur best heard at the right upper sternal border, +2 pulses  in bilateral upper extremities Pulm: Distant breath sounds faint crackles at the bases, normal worker breathing at rest Abdomen: Morbidly obese, soft, without any obvious tenderness Ext: Trace pedal bilateral edema right greater than left -------------------------------------------------------------------------------------------------------------------------------------------------------------------- Labs:  Results for orders placed or performed during the hospital encounter of 10/30/14 (from the past 24 hour(s))  Basic metabolic panel     Status: Abnormal   Collection Time: 10/30/14  4:34 PM  Result Value Ref Range   Sodium 142 135 - 145 mmol/L   Potassium 2.9 (L) 3.5 - 5.1 mmol/L   Chloride 104 101 - 111 mmol/L   CO2 26 22 - 32 mmol/L   Glucose, Bld 99 70 - 99 mg/dL   BUN <5 (L) 6 - 20 mg/dL   Creatinine, Ser 6.04 0.44 - 1.00 mg/dL   Calcium 8.7 (L) 8.9 - 10.3 mg/dL   GFR calc non Af Amer >60 >60 mL/min   GFR calc Af Amer >60 >60 mL/min   Anion gap 12 5 - 15  BNP (order ONLY if patient complains of dyspnea/SOB AND you have documented it for THIS visit)     Status: None   Collection Time: 10/30/14  4:34 PM  Result Value Ref Range   B Natriuretic Peptide 68.4 0.0 - 100.0 pg/mL  Troponin I     Status: None   Collection Time: 10/30/14  4:34 PM  Result Value Ref Range   Troponin I <0.03 <0.031 ng/mL  CBC with Differential     Status: None   Collection Time: 10/30/14  4:34 PM  Result Value Ref Range   WBC 8.7 4.0 - 10.5 K/uL   RBC 4.79 3.87 - 5.11 MIL/uL   Hemoglobin 13.5 12.0 - 15.0 g/dL   HCT 54.0 98.1 - 19.1 %   MCV 86.6 78.0 - 100.0 fL   MCH 28.2 26.0 - 34.0 pg   MCHC 32.5 30.0 - 36.0 g/dL   RDW 47.8 29.5 - 62.1 %   Platelets 316 150 - 400 K/uL   Neutrophils Relative % 72 43 - 77 %   Neutro Abs 6.3 1.7 - 7.7 K/uL   Lymphocytes Relative 21 12 - 46 %   Lymphs Abs 1.9 0.7 - 4.0 K/uL   Monocytes Relative 6 3 - 12 %   Monocytes Absolute 0.5 0.1 - 1.0 K/uL   Eosinophils  Relative 1 0 - 5 %   Eosinophils Absolute 0.1  0.0 - 0.7 K/uL   Basophils Relative 0 0 - 1 %   Basophils Absolute 0.0 0.0 - 0.1 K/uL

## 2014-10-30 NOTE — ED Notes (Signed)
Patient returned from Vascular 

## 2014-10-31 ENCOUNTER — Telehealth: Payer: Self-pay

## 2014-10-31 NOTE — Telephone Encounter (Addendum)
Jennie from [facility] called and LM that a provider requested that the patient have a BP check.  Tinnie Gens states that there staffing is low and that they will not be able to do patients BP so she needed the patient to come into the Clinics for her BP check.   Called pt and informed pt that she needs to have her BP check due to [facility] nurse not being able to.  Pt stated that she will be able to come in Friday, May 12th @ 1100 for BP check. Informed front office staff to put in nurse visit.

## 2014-10-31 NOTE — ED Notes (Signed)
Ambulated patient in the hall. Patient had mild shortness of breath. O2 saturation did not go below 91% with a heart rate of 113.

## 2014-10-31 NOTE — ED Notes (Signed)
Patient is alert and orientedx4.  Patient was explained discharge instructions and they understood them with no questions.   

## 2014-11-01 ENCOUNTER — Ambulatory Visit: Payer: Medicaid Other

## 2014-11-01 VITALS — BP 145/86 | HR 99 | Wt >= 6400 oz

## 2014-11-01 DIAGNOSIS — Z0130 Encounter for examination of blood pressure without abnormal findings: Secondary | ICD-10-CM

## 2014-11-01 NOTE — Progress Notes (Signed)
Pt here today for BP check.  Pt states that she has taken her HCTZ this am but not her labetolol due to cost.  Pt informed me that she will be able to pick up labetalol today since she has money.  Informed Dr. Adrian Blackwater of BP 145/86, recommendation to continue to monitor the BP but along with labetalol BP should stabilize.  Pt stated understanding, she was on her way to pick up labetalol, and  that we will see her for her pp visiti on June 2nd.

## 2014-11-21 ENCOUNTER — Ambulatory Visit (INDEPENDENT_AMBULATORY_CARE_PROVIDER_SITE_OTHER): Payer: Medicaid Other | Admitting: Obstetrics and Gynecology

## 2014-11-21 ENCOUNTER — Encounter: Payer: Self-pay | Admitting: Obstetrics and Gynecology

## 2014-11-21 DIAGNOSIS — Z3202 Encounter for pregnancy test, result negative: Secondary | ICD-10-CM

## 2014-11-21 DIAGNOSIS — Z01812 Encounter for preprocedural laboratory examination: Secondary | ICD-10-CM

## 2014-11-21 DIAGNOSIS — Z30430 Encounter for insertion of intrauterine contraceptive device: Secondary | ICD-10-CM

## 2014-11-21 LAB — POCT PREGNANCY, URINE: Preg Test, Ur: NEGATIVE

## 2014-11-21 MED ORDER — LEVONORGESTREL 18.6 MCG/DAY IU IUD
INTRAUTERINE_SYSTEM | Freq: Once | INTRAUTERINE | Status: AC
  Administered 2014-11-21: 1 via INTRAUTERINE

## 2014-11-21 NOTE — Progress Notes (Signed)
  Subjective:     Jillian Reed is a 34 y.o. female who presents for a postpartum visit. She is 4 weeks postpartum following a spontaneous vaginal delivery. I have fully reviewed the prenatal and intrapartum course. The delivery was at 38 gestational weeks. Outcome: spontaneous vaginal delivery. Anesthesia: epidural. Postpartum course has been uncomplicated. Baby's course has been uncomplicated. Baby is feeding by formula. Bleeding no bleeding. Bowel function is normal. Bladder function is normal. Patient is not sexually active. Contraception method is none. Postpartum depression screening: negative. Patient reports worsening pain in her right wrist associated with her carpal tunnel syndrome. She reports almost dropping her infant. She states that surgical intervention was offered for her in the past but did not follow through as her symptoms improved a little.     Review of Systems A comprehensive review of systems was negative.   Objective:    BP 135/75 mmHg  Pulse 101  Temp(Src) 98 F (36.7 C)  Wt 410 lb 12.8 oz (186.338 kg)  Breastfeeding? No  General:  alert, cooperative and no distress   Breasts:  inspection negative, no nipple discharge or bleeding, no masses or nodularity palpable  Lungs: clear to auscultation bilaterally  Heart:  regular rate and rhythm  Abdomen: soft, non-tender; bowel sounds normal; no masses,  no organomegaly   Vulva:  normal  Vagina: normal vagina, no discharge, exudate, lesion, or erythema  Cervix:  multiparous appearance  Corpus: bimanual exam limited secondary to body habitus  Adnexa:  bimanual exam limited secondary to body habitus  Rectal Exam: Not performed.        Assessment:     Normal postpartum exam. Pap smear not done at today's visit.   Plan:    1. Contraception: IUD  IUD Procedure Note Patient identified, informed consent performed, signed copy in chart, time out was performed.  Urine pregnancy test negative.  Speculum placed in the  vagina.  Cervix visualized.  Cleaned with Betadine x 2.  Grasped anteriorly with a single tooth tenaculum.  Uterus sounded to 9 cm.  Mirena IUD placed per manufacturer's recommendations.  Strings trimmed to 3 cm. Tenaculum was removed, good hemostasis noted.  Patient tolerated procedure well.   Patient given post procedure instructions and Mirena care card with expiration date.  Patient is asked to check IUD strings periodically and follow up in 4-6 weeks for IUD check.   2. Patient with normal BP on labetalol and HCTZ. Advised to follow up with PCP for further management of her CHTN 3. Follow up in: 4 weeks for IUD check or as needed 4. Referral to ortho for right wrist pain.

## 2014-11-21 NOTE — Patient Instructions (Signed)
Levonorgestrel intrauterine device (IUD) What is this medicine? LEVONORGESTREL IUD (LEE voe nor jes trel) is a contraceptive (birth control) device. The device is placed inside the uterus by a healthcare professional. It is used to prevent pregnancy and can also be used to treat heavy bleeding that occurs during your period. Depending on the device, it can be used for 3 to 5 years. This medicine may be used for other purposes; ask your health care provider or pharmacist if you have questions. COMMON BRAND NAME(S): LILETTA, Mirena, Skyla What should I tell my health care provider before I take this medicine? They need to know if you have any of these conditions: -abnormal Pap smear -cancer of the breast, uterus, or cervix -diabetes -endometritis -genital or pelvic infection now or in the past -have more than one sexual partner or your partner has more than one partner -heart disease -history of an ectopic or tubal pregnancy -immune system problems -IUD in place -liver disease or tumor -problems with blood clots or take blood-thinners -use intravenous drugs -uterus of unusual shape -vaginal bleeding that has not been explained -an unusual or allergic reaction to levonorgestrel, other hormones, silicone, or polyethylene, medicines, foods, dyes, or preservatives -pregnant or trying to get pregnant -breast-feeding How should I use this medicine? This device is placed inside the uterus by a health care professional. Talk to your pediatrician regarding the use of this medicine in children. Special care may be needed. Overdosage: If you think you have taken too much of this medicine contact a poison control center or emergency room at once. NOTE: This medicine is only for you. Do not share this medicine with others. What if I miss a dose? This does not apply. What may interact with this medicine? Do not take this medicine with any of the following  medications: -amprenavir -bosentan -fosamprenavir This medicine may also interact with the following medications: -aprepitant -barbiturate medicines for inducing sleep or treating seizures -bexarotene -griseofulvin -medicines to treat seizures like carbamazepine, ethotoin, felbamate, oxcarbazepine, phenytoin, topiramate -modafinil -pioglitazone -rifabutin -rifampin -rifapentine -some medicines to treat HIV infection like atazanavir, indinavir, lopinavir, nelfinavir, tipranavir, ritonavir -St. John's wort -warfarin This list may not describe all possible interactions. Give your health care provider a list of all the medicines, herbs, non-prescription drugs, or dietary supplements you use. Also tell them if you smoke, drink alcohol, or use illegal drugs. Some items may interact with your medicine. What should I watch for while using this medicine? Visit your doctor or health care professional for regular check ups. See your doctor if you or your partner has sexual contact with others, becomes HIV positive, or gets a sexual transmitted disease. This product does not protect you against HIV infection (AIDS) or other sexually transmitted diseases. You can check the placement of the IUD yourself by reaching up to the top of your vagina with clean fingers to feel the threads. Do not pull on the threads. It is a good habit to check placement after each menstrual period. Call your doctor right away if you feel more of the IUD than just the threads or if you cannot feel the threads at all. The IUD may come out by itself. You may become pregnant if the device comes out. If you notice that the IUD has come out use a backup birth control method like condoms and call your health care provider. Using tampons will not change the position of the IUD and are okay to use during your period. What side effects may   I notice from receiving this medicine? Side effects that you should report to your doctor or  health care professional as soon as possible: -allergic reactions like skin rash, itching or hives, swelling of the face, lips, or tongue -fever, flu-like symptoms -genital sores -high blood pressure -no menstrual period for 6 weeks during use -pain, swelling, warmth in the leg -pelvic pain or tenderness -severe or sudden headache -signs of pregnancy -stomach cramping -sudden shortness of breath -trouble with balance, talking, or walking -unusual vaginal bleeding, discharge -yellowing of the eyes or skin Side effects that usually do not require medical attention (report to your doctor or health care professional if they continue or are bothersome): -acne -breast pain -change in sex drive or performance -changes in weight -cramping, dizziness, or faintness while the device is being inserted -headache -irregular menstrual bleeding within first 3 to 6 months of use -nausea This list may not describe all possible side effects. Call your doctor for medical advice about side effects. You may report side effects to FDA at 1-800-FDA-1088. Where should I keep my medicine? This does not apply. NOTE: This sheet is a summary. It may not cover all possible information. If you have questions about this medicine, talk to your doctor, pharmacist, or health care provider.  2015, [employer]. (2011-07-08 13:54:04)  

## 2014-12-08 ENCOUNTER — Emergency Department (HOSPITAL_COMMUNITY)
Admission: EM | Admit: 2014-12-08 | Discharge: 2014-12-08 | Disposition: A | Discharge status: Home / Self Care | Payer: Medicaid Other | Attending: Emergency Medicine | Admitting: Emergency Medicine

## 2014-12-08 ENCOUNTER — Encounter (HOSPITAL_COMMUNITY): Payer: Self-pay | Admitting: Emergency Medicine

## 2014-12-08 ENCOUNTER — Emergency Department (HOSPITAL_COMMUNITY): Payer: Medicaid Other

## 2014-12-08 DIAGNOSIS — E669 Obesity, unspecified: Secondary | ICD-10-CM | POA: Diagnosis not present

## 2014-12-08 DIAGNOSIS — S60212A Contusion of left wrist, initial encounter: Secondary | ICD-10-CM | POA: Diagnosis not present

## 2014-12-08 DIAGNOSIS — W228XXA Striking against or struck by other objects, initial encounter: Secondary | ICD-10-CM | POA: Diagnosis not present

## 2014-12-08 DIAGNOSIS — Z8669 Personal history of other diseases of the nervous system and sense organs: Secondary | ICD-10-CM | POA: Diagnosis not present

## 2014-12-08 DIAGNOSIS — Y998 Other external cause status: Secondary | ICD-10-CM | POA: Insufficient documentation

## 2014-12-08 DIAGNOSIS — Z87440 Personal history of urinary (tract) infections: Secondary | ICD-10-CM | POA: Diagnosis not present

## 2014-12-08 DIAGNOSIS — Z862 Personal history of diseases of the blood and blood-forming organs and certain disorders involving the immune mechanism: Secondary | ICD-10-CM | POA: Diagnosis not present

## 2014-12-08 DIAGNOSIS — I10 Essential (primary) hypertension: Secondary | ICD-10-CM | POA: Insufficient documentation

## 2014-12-08 DIAGNOSIS — Y9389 Activity, other specified: Secondary | ICD-10-CM | POA: Insufficient documentation

## 2014-12-08 DIAGNOSIS — S6992XA Unspecified injury of left wrist, hand and finger(s), initial encounter: Secondary | ICD-10-CM | POA: Diagnosis present

## 2014-12-08 DIAGNOSIS — Z79899 Other long term (current) drug therapy: Secondary | ICD-10-CM | POA: Insufficient documentation

## 2014-12-08 DIAGNOSIS — Z8659 Personal history of other mental and behavioral disorders: Secondary | ICD-10-CM | POA: Insufficient documentation

## 2014-12-08 DIAGNOSIS — Z8719 Personal history of other diseases of the digestive system: Secondary | ICD-10-CM | POA: Insufficient documentation

## 2014-12-08 DIAGNOSIS — Y929 Unspecified place or not applicable: Secondary | ICD-10-CM | POA: Insufficient documentation

## 2014-12-08 HISTORY — DX: Obesity, unspecified: E66.9

## 2014-12-08 MED ORDER — IBUPROFEN 600 MG PO TABS: 600.0000 mg | ORAL_TABLET | Freq: Four times a day (QID) | ORAL | Status: DC | PRN

## 2014-12-08 NOTE — Discharge Instructions (Signed)
Recommend ice to the affected area 3-4 times per day for 15-20 minutes each time. Wear a brace for stability. Recommend ibuprofen as prescribed for pain and inflammation. If you are breast-feeding, "pump and dump" prior to feeding your infant. Follow up with a hand specialist for further evaluation of symptoms.  Contusion A contusion is a deep bruise. Contusions happen when an injury causes bleeding under the skin. Signs of bruising include pain, puffiness (swelling), and discolored skin. The contusion may turn blue, purple, or yellow. HOME CARE   Put ice on the injured area.  Put ice in a plastic bag.  Place a towel between your skin and the bag.  Leave the ice on for 15-20 minutes, 03-04 times a day.  Only take medicine as told by your doctor.  Rest the injured area.  If possible, raise (elevate) the injured area to lessen puffiness. GET HELP RIGHT AWAY IF:   You have more bruising or puffiness.  You have pain that is getting worse.  Your puffiness or pain is not helped by medicine. MAKE SURE YOU:   Understand these instructions.  Will watch your condition.  Will get help right away if you are not doing well or get worse. Document Released: 11/24/2007 Document Revised: 08/30/2011 Document Reviewed: 04/12/2011 [facility] Patient Information 2015 [place], [place]. This information is not intended to replace advice given to you by your health care provider. Make sure you discuss any questions you have with your health care provider.  Carpal Tunnel Syndrome The carpal tunnel is a narrow area located on the palm side of your wrist. The tunnel is formed by the wrist bones and ligaments. Nerves, blood vessels, and tendons pass through the carpal tunnel. Repeated wrist motion or certain diseases may cause swelling within the tunnel. This swelling pinches the main nerve in the wrist (median nerve) and causes the painful hand and arm condition called carpal tunnel syndrome. CAUSES    Repeated wrist motions.  Wrist injuries.  Certain diseases like arthritis, diabetes, alcoholism, hyperthyroidism, and kidney failure.  Obesity.  Pregnancy. SYMPTOMS   A "pins and needles" feeling in your fingers or hand, especially in your thumb, index and middle fingers.  Tingling or numbness in your fingers or hand.  An aching feeling in your entire arm, especially when your wrist and elbow are bent for long periods of time.  Wrist pain that goes up your arm to your shoulder.  Pain that goes down into your palm or fingers.  A weak feeling in your hands. DIAGNOSIS  Your health care provider will take your history and perform a physical exam. An electromyography test may be needed. This test measures electrical signals sent out by your nerves into the muscles. The electrical signals are usually slowed by carpal tunnel syndrome. You may also need X-rays. TREATMENT  Carpal tunnel syndrome may clear up by itself. Your health care provider may recommend a wrist splint or medicine such as a nonsteroidal anti-inflammatory medicine. Cortisone injections may help. Sometimes, surgery may be needed to free the pinched nerve.  HOME CARE INSTRUCTIONS   Take all medicine as directed by your health care provider. Only take over-the-counter or prescription medicines for pain, discomfort, or fever as directed by your health care provider.  If you were given a splint to keep your wrist from bending, wear it as directed. It is important to wear the splint at night. Wear the splint for as long as you have pain or numbness in your hand, arm, or wrist.  This may take 1 to 2 months.  Rest your wrist from any activity that may be causing your pain. If your symptoms are work-related, you may need to talk to your employer about changing to a job that does not require using your wrist.  Put ice on your wrist after long periods of wrist activity.  Put ice in a plastic bag.  Place a towel between your  skin and the bag.  Leave the ice on for 15-20 minutes, 03-04 times a day.  Keep all follow-up visits as directed by your health care provider. This includes any orthopedic referrals, physical therapy, and rehabilitation. Any delay in getting necessary care could result in a delay or failure of your condition to heal. SEEK IMMEDIATE MEDICAL CARE IF:   You have new, unexplained symptoms.  Your symptoms get worse and are not helped or controlled with medicines. MAKE SURE YOU:   Understand these instructions.  Will watch your condition.  Will get help right away if you are not doing well or get worse. Document Released: 06/04/2000 Document Revised: 10/22/2013 Document Reviewed: 04/23/2011 [facility] Patient Information 2015 [place], [place]. This information is not intended to replace advice given to you by your health care provider. Make sure you discuss any questions you have with your health care provider.

## 2014-12-08 NOTE — ED Provider Notes (Signed)
CSN: [id number]     Arrival date & time 12/08/14  2000 History   First MD Initiated Contact with Patient 12/08/14 2017     Chief Complaint  Patient presents with  . Wrist Pain    (Consider location/radiation/quality/duration/timing/severity/associated sxs/prior Treatment) HPI Comments: 34 year old female presents to the emergency department for further evaluation of left wrist pain after hitting her wrist into the side of her brick house 2 days ago. Patient reports constant pain to the medial aspect of her left wrist with paresthesias in her left thumb. She reports a history of carpal tunnel. She feels the pain "shooting" upper arm at times. No medications taken prior to arrival. No icing or bracing. Patient denies being followed by a hand specialist for her carpal tunnel. Patient is right-hand dominant.  Patient is a 34 y.o. female presenting with wrist pain. The history is provided by the patient. No language interpreter was used.  Wrist Pain Associated symptoms include arthralgias and joint swelling. Pertinent negatives include no numbness or weakness.    Past Medical History  Diagnosis Date  . Vaginal Pap smear, abnormal   . Anemia   . Anxiety   . GERD (gastroesophageal reflux disease)   . Hypertension   . Pregnancy induced hypertension   . Headache   . Infection     UTI  . Depression     meds in teens; doing ok now  . Obesity    Past Surgical History  Procedure Laterality Date  . External ear surgery    . Therapeutic abortion     Family History  Problem Relation Age of Onset  . Diabetes Mother   . Hypertension Mother   . Arthritis Mother   . Hyperlipidemia Mother   . Diabetes Father   . Hypertension Father   . Heart disease Maternal Grandmother    History  Substance Use Topics  . Smoking status: Never Smoker   . Smokeless tobacco: Never Used  . Alcohol Use: Yes     Comment: occassionally when not pregnant   OB History    Gravida Para Term Preterm AB TAB SAB  Ectopic Multiple Living   0 1      Review of Systems  Musculoskeletal: Positive for joint swelling and arthralgias.  Neurological: Negative for weakness and numbness.       + Paresthesias L thumb  All other systems reviewed and are negative.   Allergies  Percocet and Tramadol  Home Medications   Prior to Admission medications   Medication Sig Start Date End Date Taking? Authorizing Provider  cetirizine (ZYRTEC) 1 MG/ML syrup Take 10 mg by mouth as needed (allergies).    Historical Provider, MD  hydrochlorothiazide (HYDRODIURIL) 25 MG tablet Take 1 tablet (25 mg total) by mouth daily. 10/28/14   Marny Lowenstein, PA-C  ibuprofen (ADVIL,MOTRIN) 600 MG tablet Take 1 tablet (600 mg total) by mouth every 6 (six) hours as needed. 12/08/14   Antony Madura, PA-C  labetalol (NORMODYNE) 100 MG tablet Take 1 tablet (100 mg total) by mouth 2 (two) times daily. 10/28/14   Marny Lowenstein, PA-C  Prenatal Vit-Fe Fumarate-FA (PRENATAL VITAMIN PO) Take 1 tablet by mouth daily.     Historical Provider, MD   BP 140/77 mmHg  Pulse 99  Temp(Src) 98.5 F (36.9 C) (Oral)  Resp 24  Ht  (1.702 m)  Wt 405 lb 7 oz (183.905 kg)  BMI 63.49 kg/m2  SpO2 98%  LMP  12/01/2014   Physical Exam  Constitutional: She is oriented to person, place, and time. She appears well-developed and well-nourished. No distress.  HENT:  Head: Normocephalic and atraumatic.  Eyes: Conjunctivae and EOM are normal. No scleral icterus.  Neck: Normal range of motion.  Cardiovascular: Normal rate, regular rhythm and intact distal pulses.   Distal radial pulse 2+ in the left upper extremity  Pulmonary/Chest: Effort normal. No respiratory distress.  Musculoskeletal: Normal range of motion. She exhibits tenderness.       Right wrist: She exhibits tenderness, bony tenderness and swelling (mild, medial). She exhibits normal range of motion, no effusion, no crepitus, no deformity and no laceration.       Right hand: She  exhibits no deformity. Normal sensation noted. Normal strength noted.       Hands: Neurological: She is alert and oriented to person, place, and time. She exhibits normal muscle tone. Coordination normal.  Sensation to light touch intact in all digits of left hand. 5/5 grip strength and 5/5 strength against resistance of flexors and extensors in the left hand.  Skin: Skin is warm and dry. No rash noted. She is not diaphoretic. No erythema. No pallor.  Psychiatric: She has a normal mood and affect. Her behavior is normal.  Nursing note and vitals reviewed.   ED Course  Procedures (including critical care time) Labs Review Labs Reviewed - No data to display  Imaging Review Dg Wrist Complete Left  12/08/2014   CLINICAL DATA:  Patient hit wrist on side of house  EXAM: LEFT WRIST - COMPLETE 3+ VIEW  COMPARISON:  None.  FINDINGS: Frontal, oblique, lateral, and ulnar deviation scaphoid images were obtained. There is no fracture or dislocation. Joint spaces appear intact. No erosive change.  IMPRESSION: No fracture or dislocation.  No appreciable arthropathy.   Electronically Signed   By: Bretta Bang III M.D.   On: 12/08/2014 20:46     EKG Interpretation None      MDM   Final diagnoses:  Wrist contusion, left, initial encounter  H/O carpal tunnel syndrome    34 year old female presents to the emergency department for further evaluation of left wrist pain. Patient is neurovascularly intact. Symptoms consistent with contusion to left wrist. Patient's paresthesias in her L thumb are likely from worsening of her known carpal tunnel syndrome. Patient given a wrist brace in ED for stability. Have advised RICE and NSAIDs for symptom management. Referral to orthopedic hand specialist given and return precautions provided. Patient agreeable to plan with no unaddressed concerns. Patient discharged in good condition.   Filed Vitals:   12/08/14 2009  BP: 140/77  Pulse: 99  Temp: 98.5 F (36.9  C)  TempSrc: Oral  Resp: 24  Height: 5\' 7"  (1.702 m)  Weight: 405 lb 7 oz (183.905 kg)  SpO2: 98%     Antony Madura, PA-C 12/08/14 2109  Linwood Dibbles, MD 12/09/14 [phone]

## 2014-12-08 NOTE — ED Notes (Signed)
Pt. accidentally hit her left wrist against a brick wall 2 days ago , reports pain at left wrist radiating to upper arm .

## 2014-12-08 NOTE — ED Notes (Signed)
Pt. Left with all belongings and refused wheelchair 

## 2015-04-07 ENCOUNTER — Encounter (HOSPITAL_COMMUNITY): Payer: Self-pay | Admitting: Emergency Medicine

## 2015-04-07 ENCOUNTER — Emergency Department (HOSPITAL_COMMUNITY)
Admission: EM | Admit: 2015-04-07 | Discharge: 2015-04-07 | Disposition: A | Discharge status: Home / Self Care | Payer: Medicaid Other | Attending: Emergency Medicine | Admitting: Emergency Medicine

## 2015-04-07 DIAGNOSIS — Z79899 Other long term (current) drug therapy: Secondary | ICD-10-CM | POA: Insufficient documentation

## 2015-04-07 DIAGNOSIS — E669 Obesity, unspecified: Secondary | ICD-10-CM | POA: Insufficient documentation

## 2015-04-07 DIAGNOSIS — Z8719 Personal history of other diseases of the digestive system: Secondary | ICD-10-CM | POA: Insufficient documentation

## 2015-04-07 DIAGNOSIS — H9203 Otalgia, bilateral: Secondary | ICD-10-CM | POA: Diagnosis present

## 2015-04-07 DIAGNOSIS — I10 Essential (primary) hypertension: Secondary | ICD-10-CM | POA: Insufficient documentation

## 2015-04-07 DIAGNOSIS — Z87440 Personal history of urinary (tract) infections: Secondary | ICD-10-CM | POA: Diagnosis not present

## 2015-04-07 DIAGNOSIS — H6693 Otitis media, unspecified, bilateral: Secondary | ICD-10-CM | POA: Insufficient documentation

## 2015-04-07 DIAGNOSIS — Z862 Personal history of diseases of the blood and blood-forming organs and certain disorders involving the immune mechanism: Secondary | ICD-10-CM | POA: Diagnosis not present

## 2015-04-07 DIAGNOSIS — Z8659 Personal history of other mental and behavioral disorders: Secondary | ICD-10-CM | POA: Insufficient documentation

## 2015-04-07 MED ORDER — AMOXICILLIN 500 MG PO CAPS: 500.0000 mg | ORAL_CAPSULE | Freq: Three times a day (TID) | ORAL | Status: DC

## 2015-04-07 NOTE — ED Notes (Signed)
Pt c.o burning in bilateral ears x 2 days. Today she noticed greenish drainage from ear and post nasal drip. Denies fevers.

## 2015-04-07 NOTE — ED Notes (Signed)
NP at bedside.

## 2015-04-07 NOTE — Discharge Instructions (Signed)

## 2015-04-07 NOTE — ED Provider Notes (Signed)
CSN: [id number]     Arrival date & time 04/07/15  2133 History  By signing my name below, I, Soijett Blue, attest that this documentation has been prepared under the direction and in the presence of Langston Masker, PA-C Electronically Signed: Soijett Blue, ED Scribe. 04/07/2015. 10:52 PM.   Chief Complaint  Patient presents with  . Otalgia      The history is provided by the patient. No language interpreter was used.    Jillian Reed is a 34 y.o. female who presents to the Emergency Department complaining of mild bilateral ear pain onset 2 days. She notes that today she noticed green discharge from both ears.she reports that she used to have tubes in her ears as a child and none as an adult. She also states that she has drainage all the time from her ears. Pt is having associated symptoms of post nasal drip. She notes that she has not tried any medications for the relief of her symptoms. She denies fevers, facial pain/sinus pressure, and any other symptoms. She denies being a smoker but she is around her mother who is a smoker. She reports that her PCP [facility] at [facility] She is allergic to percocet and tramadol.   Past Medical History  Diagnosis Date  . Vaginal Pap smear, abnormal   . Anemia   . Anxiety   . GERD (gastroesophageal reflux disease)   . Hypertension   . Pregnancy induced hypertension   . Headache   . Infection     UTI  . Depression     meds in teens; doing ok now  . Obesity    Past Surgical History  Procedure Laterality Date  . External ear surgery    . Therapeutic abortion     Family History  Problem Relation Age of Onset  . Diabetes Mother   . Hypertension Mother   . Arthritis Mother   . Hyperlipidemia Mother   . Diabetes Father   . Hypertension Father   . Heart disease Maternal Grandmother    Social History  Substance Use Topics  . Smoking status: Never Smoker   . Smokeless tobacco: Never Used  . Alcohol Use: Yes     Comment:  occassionally when not pregnant   OB History    Gravida Para Term Preterm AB TAB SAB Ectopic Multiple Living   0 1     Review of Systems  Constitutional: Negative for fever.  HENT: Positive for ear discharge, ear pain and postnasal drip. Negative for sinus pressure.       Allergies  Percocet and Tramadol  Home Medications   Prior to Admission medications   Medication Sig Start Date End Date Taking? Authorizing Provider  cetirizine (ZYRTEC) 1 MG/ML syrup Take 10 mg by mouth as needed (allergies).    Historical Provider, MD  hydrochlorothiazide (HYDRODIURIL) 25 MG tablet Take 1 tablet (25 mg total) by mouth daily. 10/28/14   Marny Lowenstein, PA-C  ibuprofen (ADVIL,MOTRIN) 600 MG tablet Take 1 tablet (600 mg total) by mouth every 6 (six) hours as needed. 12/08/14   Antony Madura, PA-C  labetalol (NORMODYNE) 100 MG tablet Take 1 tablet (100 mg total) by mouth 2 (two) times daily. 10/28/14   Marny Lowenstein, PA-C  Prenatal Vit-Fe Fumarate-FA (PRENATAL VITAMIN PO) Take 1 tablet by mouth daily.     Historical Provider, MD   BP 154/84 mmHg  Pulse 96  Temp(Src) 98.8 F (37.1 C) (  Oral)  Resp 20  Ht 5\' 7"  (1.702 m)  Wt 404 lb 7 oz (183.452 kg)  BMI 63.33 kg/m2  SpO2 98%  LMP 02/05/2015 Physical Exam  Constitutional: She is oriented to person, place, and time. She appears well-developed and well-nourished. No distress.  HENT:  Head: Normocephalic and atraumatic.  Mouth/Throat: Posterior oropharyngeal erythema (mil) present.  Bilateral TMs erythematous. Post nasal drainage.  Eyes: EOM are normal.  Neck: Neck supple.  Cardiovascular: Normal rate, regular rhythm and normal heart sounds.  Exam reveals no gallop and no friction rub.   No murmur heard. Pulmonary/Chest: Effort normal and breath sounds normal. No respiratory distress. She has no wheezes. She has no rales.  Musculoskeletal: Normal range of motion.  Neurological: She is alert and oriented to person, place, and time.   Skin: Skin is warm and dry.  Psychiatric: She has a normal mood and affect. Her behavior is normal.  Nursing note and vitals reviewed.   ED Course  Procedures (including critical care time) DIAGNOSTIC STUDIES: Oxygen Saturation is 98% on RA, nl by my interpretation.    COORDINATION OF CARE: 10:46 PM Discussed treatment plan with pt at bedside which includes decongestant, abx Rx, and pt agreed to plan.    Labs Review Labs Reviewed - No data to display  Imaging Review No results found.    EKG Interpretation None      MDM   Final diagnoses:  Bilateral otitis media, recurrence not specified, unspecified chronicity, unspecified otitis media type    amoxicillian 500 avs    Elson Areas, PA-C 04/07/15 2305  Rolan Bucco, MD 04/07/15 [phone]

## 2015-06-18 ENCOUNTER — Emergency Department (HOSPITAL_COMMUNITY)
Admission: EM | Admit: 2015-06-18 | Discharge: 2015-06-18 | Disposition: A | Discharge status: Home / Self Care | Payer: Medicaid Other | Attending: Emergency Medicine | Admitting: Emergency Medicine

## 2015-06-18 ENCOUNTER — Emergency Department (HOSPITAL_COMMUNITY): Payer: Medicaid Other

## 2015-06-18 ENCOUNTER — Encounter (HOSPITAL_COMMUNITY): Payer: Self-pay | Admitting: Emergency Medicine

## 2015-06-18 DIAGNOSIS — R05 Cough: Secondary | ICD-10-CM | POA: Insufficient documentation

## 2015-06-18 DIAGNOSIS — J3489 Other specified disorders of nose and nasal sinuses: Secondary | ICD-10-CM | POA: Diagnosis not present

## 2015-06-18 DIAGNOSIS — J029 Acute pharyngitis, unspecified: Secondary | ICD-10-CM | POA: Insufficient documentation

## 2015-06-18 DIAGNOSIS — I10 Essential (primary) hypertension: Secondary | ICD-10-CM | POA: Diagnosis not present

## 2015-06-18 DIAGNOSIS — E669 Obesity, unspecified: Secondary | ICD-10-CM | POA: Insufficient documentation

## 2015-06-18 DIAGNOSIS — Z8719 Personal history of other diseases of the digestive system: Secondary | ICD-10-CM | POA: Diagnosis not present

## 2015-06-18 DIAGNOSIS — Z79899 Other long term (current) drug therapy: Secondary | ICD-10-CM | POA: Diagnosis not present

## 2015-06-18 DIAGNOSIS — Z87440 Personal history of urinary (tract) infections: Secondary | ICD-10-CM | POA: Insufficient documentation

## 2015-06-18 DIAGNOSIS — Z792 Long term (current) use of antibiotics: Secondary | ICD-10-CM | POA: Diagnosis not present

## 2015-06-18 DIAGNOSIS — H6692 Otitis media, unspecified, left ear: Secondary | ICD-10-CM

## 2015-06-18 DIAGNOSIS — H9203 Otalgia, bilateral: Secondary | ICD-10-CM | POA: Diagnosis present

## 2015-06-18 DIAGNOSIS — Z8659 Personal history of other mental and behavioral disorders: Secondary | ICD-10-CM | POA: Insufficient documentation

## 2015-06-18 DIAGNOSIS — D649 Anemia, unspecified: Secondary | ICD-10-CM | POA: Diagnosis not present

## 2015-06-18 DIAGNOSIS — H6592 Unspecified nonsuppurative otitis media, left ear: Secondary | ICD-10-CM | POA: Diagnosis not present

## 2015-06-18 MED ORDER — AMOXICILLIN-POT CLAVULANATE 875-125 MG PO TABS: 1.0000 | ORAL_TABLET | Freq: Two times a day (BID) | ORAL | Status: DC

## 2015-06-18 NOTE — ED Notes (Signed)
Pt. reports productive cough ( blood tinged phlegm )  with chest congestion , sore throat and bilateral otalgia onset last week . Denies fever or chills.

## 2015-06-18 NOTE — Discharge Instructions (Signed)
You were seen today for your cough, ear pain, throat pain. This is all likely related to your ear infection as well as an upper respiratory infection. Use over-the-counter saline nose spray to help relieve her sinus congestion. Takes the antibiotics as prescribed. Follow up with her primary care physician if symptoms persist.  Otitis Media, Adult Otitis media is redness, soreness, and inflammation of the middle ear. Otitis media may be caused by allergies or, most commonly, by infection. Often it occurs as a complication of the common cold. SIGNS AND SYMPTOMS Symptoms of otitis media may include:  Earache.  Fever.  Ringing in your ear.  Headache.  Leakage of fluid from the ear. DIAGNOSIS To diagnose otitis media, your health care provider will examine your ear with an otoscope. This is an instrument that allows your health care provider to see into your ear in order to examine your eardrum. Your health care provider also will ask you questions about your symptoms. TREATMENT  Typically, otitis media resolves on its own within 3-5 days. Your health care provider may prescribe medicine to ease your symptoms of pain. If otitis media does not resolve within 5 days or is recurrent, your health care provider may prescribe antibiotic medicines if he or she suspects that a bacterial infection is the cause. HOME CARE INSTRUCTIONS   If you were prescribed an antibiotic medicine, finish it all even if you start to feel better.  Take medicines only as directed by your health care provider.  Keep all follow-up visits as directed by your health care provider. SEEK MEDICAL CARE IF:  You have otitis media only in one ear, or bleeding from your nose, or both.  You notice a lump on your neck.  You are not getting better in 3-5 days.  You feel worse instead of better. SEEK IMMEDIATE MEDICAL CARE IF:   You have pain that is not controlled with medicine.  You have swelling, redness, or pain around  your ear or stiffness in your neck.  You notice that part of your face is paralyzed.  You notice that the bone behind your ear (mastoid) is tender when you touch it. MAKE SURE YOU:   Understand these instructions.  Will watch your condition.  Will get help right away if you are not doing well or get worse.   This information is not intended to replace advice given to you by your health care provider. Make sure you discuss any questions you have with your health care provider.   Document Released: 03/12/2004 Document Revised: 06/28/2014 Document Reviewed: 01/02/2013 Elsevier Interactive Patient Education [employer]

## 2015-06-18 NOTE — ED Provider Notes (Signed)
CSN: [id number]     Arrival date & time 06/18/15  0047 History   By signing my name below, I, Arlan Organ, attest that this documentation has been prepared under the direction and in the presence of Leta Baptist, MD.  Electronically Signed: Arlan Organ, ED Scribe. 06/18/2015. 3:07 AM.   Chief Complaint  Patient presents with  . Otalgia  . Sore Throat  . Cough   The history is provided by the patient. No language interpreter was used.    HPI Comments: Jillian Reed is a 34 y.o. female with a PMHx of HTN who presents to the Emergency Department complaining of constant, ongoing bilateral otalgia x 1 week. She also reports productive cough consisting of blood tinged phlegm, chest congestion, and sore throat. No aggravating or alleviating factors at this time. No OTC medications or home remedies attempted prior to arrival. Pt denies any recent fever, chills, nausea, vomiting, chest pain, or shortness of breath. Ms. Stlouis states her child is currently sick at home with similar symptoms.  PCP: [facility]    Past Medical History  Diagnosis Date  . Vaginal Pap smear, abnormal   . Anemia   . Anxiety   . GERD (gastroesophageal reflux disease)   . Hypertension   . Pregnancy induced hypertension   . Headache   . Infection     UTI  . Depression     meds in teens; doing ok now  . Obesity    Past Surgical History  Procedure Laterality Date  . External ear surgery    . Therapeutic abortion     Family History  Problem Relation Age of Onset  . Diabetes Mother   . Hypertension Mother   . Arthritis Mother   . Hyperlipidemia Mother   . Diabetes Father   . Hypertension Father   . Heart disease Maternal Grandmother    Social History  Substance Use Topics  . Smoking status: Never Smoker   . Smokeless tobacco: Never Used  . Alcohol Use: Yes     Comment: occassionally when not pregnant   OB History    Gravida Para Term Preterm AB TAB SAB Ectopic Multiple Living   0 1     Review of Systems  HENT: Positive for congestion, ear pain, postnasal drip and sore throat.   Eyes: Negative for pain and discharge.  Respiratory: Positive for cough. Negative for chest tightness and shortness of breath.   Cardiovascular: Negative for chest pain, palpitations and leg swelling.  Gastrointestinal: Negative for nausea, vomiting, abdominal pain and diarrhea.  Genitourinary: Negative for dysuria, urgency and hematuria.  Musculoskeletal: Positive for myalgias. Negative for back pain.  Skin: Negative for rash.  Neurological: Negative for dizziness, weakness, light-headedness and headaches.  Hematological: Does not bruise/bleed easily.    A complete 10 system review of systems was obtained and all systems are negative except as noted in the HPI and PMH.    Allergies  Percocet and Tramadol  Home Medications   Prior to Admission medications   Medication Sig Start Date End Date Taking? Authorizing Provider  amoxicillin (AMOXIL) 500 MG capsule Take 1 capsule (500 mg total) by mouth 3 (three) times daily. 04/07/15   Elson Areas, PA-C  amoxicillin-clavulanate (AUGMENTIN) 875-125 MG tablet Take 1 tablet by mouth every 12 (twelve) hours. 06/18/15   Leta Baptist, MD  cetirizine (ZYRTEC) 1 MG/ML syrup Take 10 mg by mouth as needed (allergies).  Historical Provider, MD  hydrochlorothiazide (HYDRODIURIL) 25 MG tablet Take 1 tablet (25 mg total) by mouth daily. 10/28/14   Marny Lowenstein, PA-C  ibuprofen (ADVIL,MOTRIN) 600 MG tablet Take 1 tablet (600 mg total) by mouth every 6 (six) hours as needed. 12/08/14   Antony Madura, PA-C  labetalol (NORMODYNE) 100 MG tablet Take 1 tablet (100 mg total) by mouth 2 (two) times daily. 10/28/14   Marny Lowenstein, PA-C  Prenatal Vit-Fe Fumarate-FA (PRENATAL VITAMIN PO) Take 1 tablet by mouth daily.     Historical Provider, MD   Triage Vitals: BP 96/66 mmHg  Pulse 102  Temp(Src) 98.3 F (36.8 C) (Oral)  Resp 22  Ht   (1.702 m)  Wt 408 lb (185.068 kg)  BMI 63.89 kg/m2  SpO2 100%   Physical Exam  Constitutional: She is oriented to person, place, and time. She appears well-developed and well-nourished. No distress.  HENT:  Head: Normocephalic and atraumatic.  Right Ear: Tympanic membrane and external ear normal.  Left Ear: External ear normal. Tympanic membrane is injected, erythematous and retracted. Tympanic membrane is not bulging. Tympanic membrane mobility is abnormal. A middle ear effusion is present.  Nose: Right sinus exhibits maxillary sinus tenderness and frontal sinus tenderness. Left sinus exhibits maxillary sinus tenderness.  Mouth/Throat: Posterior oropharyngeal erythema (mild) present. No oropharyngeal exudate or posterior oropharyngeal edema.  Post nasal drip noted over the posterior pharynx  Eyes: EOM are normal. Pupils are equal, round, and reactive to light.  Neck: Normal range of motion. Neck supple.  Cardiovascular: Normal rate, regular rhythm, normal heart sounds and intact distal pulses.   No murmur heard. Pulmonary/Chest: Effort normal. No respiratory distress. She has no wheezes. She has no rales.  Abdominal: Soft. She exhibits no distension. There is no tenderness.  Musculoskeletal: Normal range of motion. She exhibits no edema or tenderness.  Neurological: She is alert and oriented to person, place, and time.  Skin: Skin is warm and dry. No rash noted. She is not diaphoretic.  Vitals reviewed.   ED Course  Procedures (including critical care time)  DIAGNOSTIC STUDIES: Oxygen Saturation is 100% on RA, Normal by my interpretation.    COORDINATION OF CARE: 2:58 AM- Will order CXR. Discussed treatment plan with pt at bedside and pt agreed to plan.     Labs Review Labs Reviewed - No data to display  Imaging Review Dg Chest 2 View  06/18/2015  CLINICAL DATA:  Acute onset of productive cough and hemoptysis. Chest congestion. Sore throat. Bilateral otalgia. Initial  encounter. EXAM: CHEST  2 VIEW COMPARISON:  Chest radiograph and CTA of the chest performed 10/30/2014 FINDINGS: The lungs are well-aerated and clear. There is no evidence of focal opacification, pleural effusion or pneumothorax. The heart is normal in size; the mediastinal contour is within normal limits. No acute osseous abnormalities are seen. IMPRESSION: No acute cardiopulmonary process seen. Electronically Signed   By: Roanna Raider M.D.   On: 06/18/2015 02:06   I have personally reviewed and evaluated these images and lab results as part of my medical decision-making.   EKG Interpretation None      MDM  Patient was seen and evaluated in stable condition.   Results and physician examination consistent with left otitis media with URI.  Patient started on Augmentin.  Patient discharged home in stable condition with instruction on supportive care and instruction to follow up with her PCP as needed. Final diagnoses:  Acute left otitis media, recurrence not specified, unspecified otitis  media type    1. Left otitis media  2. URI  I personally performed the services described in this documentation, which was scribed in my presence. The recorded information has been reviewed and is accurate.   Leta Baptist, MD 06/20/15 [phone]

## 2015-09-04 ENCOUNTER — Encounter (HOSPITAL_COMMUNITY): Payer: Self-pay | Admitting: Emergency Medicine

## 2015-09-04 ENCOUNTER — Emergency Department (HOSPITAL_COMMUNITY): Payer: Medicaid Other

## 2015-09-04 ENCOUNTER — Emergency Department (HOSPITAL_COMMUNITY)
Admission: EM | Admit: 2015-09-04 | Discharge: 2015-09-04 | Disposition: A | Discharge status: Home / Self Care | Payer: Medicaid Other | Attending: Emergency Medicine | Admitting: Emergency Medicine

## 2015-09-04 DIAGNOSIS — Z87440 Personal history of urinary (tract) infections: Secondary | ICD-10-CM | POA: Diagnosis not present

## 2015-09-04 DIAGNOSIS — E669 Obesity, unspecified: Secondary | ICD-10-CM | POA: Diagnosis not present

## 2015-09-04 DIAGNOSIS — M791 Myalgia: Secondary | ICD-10-CM | POA: Diagnosis not present

## 2015-09-04 DIAGNOSIS — I10 Essential (primary) hypertension: Secondary | ICD-10-CM | POA: Diagnosis not present

## 2015-09-04 DIAGNOSIS — Z8659 Personal history of other mental and behavioral disorders: Secondary | ICD-10-CM | POA: Diagnosis not present

## 2015-09-04 DIAGNOSIS — D649 Anemia, unspecified: Secondary | ICD-10-CM | POA: Diagnosis not present

## 2015-09-04 DIAGNOSIS — Z8719 Personal history of other diseases of the digestive system: Secondary | ICD-10-CM | POA: Diagnosis not present

## 2015-09-04 DIAGNOSIS — R05 Cough: Secondary | ICD-10-CM | POA: Diagnosis present

## 2015-09-04 DIAGNOSIS — J069 Acute upper respiratory infection, unspecified: Secondary | ICD-10-CM | POA: Insufficient documentation

## 2015-09-04 DIAGNOSIS — R51 Headache: Secondary | ICD-10-CM | POA: Diagnosis not present

## 2015-09-04 NOTE — Discharge Instructions (Signed)
Upper Respiratory Infection, Adult Most upper respiratory infections (URIs) are a viral infection of the air passages leading to the lungs. A URI affects the nose, throat, and upper air passages. The most common type of URI is nasopharyngitis and is typically referred to as "the common cold." URIs run their course and usually go away on their own. Most of the time, a URI does not require medical attention, but sometimes a bacterial infection in the upper airways can follow a viral infection. This is called a secondary infection. Sinus and middle ear infections are common types of secondary upper respiratory infections. Bacterial pneumonia can also complicate a URI. A URI can worsen asthma and chronic obstructive pulmonary disease (COPD). Sometimes, these complications can require emergency medical care and may be life threatening.  CAUSES Almost all URIs are caused by viruses. A virus is a type of germ and can spread from one person to another.  RISKS FACTORS You may be at risk for a URI if:   You smoke.   You have chronic heart or lung disease.  You have a weakened defense (immune) system.   You are very young or very old.   You have nasal allergies or asthma.  You work in crowded or poorly ventilated areas.  You work in health care facilities or schools. SIGNS AND SYMPTOMS  Symptoms typically develop 2-3 days after you come in contact with a cold virus. Most viral URIs last 7-10 days. However, viral URIs from the influenza virus (flu virus) can last 14-18 days and are typically more severe. Symptoms may include:   Runny or stuffy (congested) nose.   Sneezing.   Cough.   Sore throat.   Headache.   Fatigue.   Fever.   Loss of appetite.   Pain in your forehead, behind your eyes, and over your cheekbones (sinus pain).  Muscle aches.  DIAGNOSIS  Your health care provider may diagnose a URI by:  Physical exam.  Tests to check that your symptoms are not due to  another condition such as:  Strep throat.  Sinusitis.  Pneumonia.  Asthma. TREATMENT  A URI goes away on its own with time. It cannot be cured with medicines, but medicines may be prescribed or recommended to relieve symptoms. Medicines may help:  Reduce your fever.  Reduce your cough.  Relieve nasal congestion. HOME CARE INSTRUCTIONS   Take medicines only as directed by your health care provider.   Gargle warm saltwater or take cough drops to comfort your throat as directed by your health care provider.  Use a warm mist humidifier or inhale steam from a shower to increase air moisture. This may make it easier to breathe.  Drink enough fluid to keep your urine clear or pale yellow.   Eat soups and other clear broths and maintain good nutrition.   Rest as needed.   Return to work when your temperature has returned to normal or as your health care provider advises. You may need to stay home longer to avoid infecting others. You can also use a face mask and careful hand washing to prevent spread of the virus.  Increase the usage of your inhaler if you have asthma.   Do not use any tobacco products, including cigarettes, chewing tobacco, or electronic cigarettes. If you need help quitting, ask your health care provider. PREVENTION  The best way to protect yourself from getting a cold is to practice good hygiene.   Avoid oral or hand contact with people with cold   symptoms.   Wash your hands often if contact occurs.  There is no clear evidence that vitamin C, vitamin E, echinacea, or exercise reduces the chance of developing a cold. However, it is always recommended to get plenty of rest, exercise, and practice good nutrition.  SEEK MEDICAL CARE IF:   You are getting worse rather than better.   Your symptoms are not controlled by medicine.   You have chills.  You have worsening shortness of breath.  You have brown or red mucus.  You have yellow or brown nasal  discharge.  You have pain in your face, especially when you bend forward.  You have a fever.  You have swollen neck glands.  You have pain while swallowing.  You have white areas in the back of your throat. SEEK IMMEDIATE MEDICAL CARE IF:   You have severe or persistent:  Headache.  Ear pain.  Sinus pain.  Chest pain.  You have chronic lung disease and any of the following:  Wheezing.  Prolonged cough.  Coughing up blood.  A change in your usual mucus.  You have a stiff neck.  You have changes in your:  Vision.  Hearing.  Thinking.  Mood. MAKE SURE YOU:   Understand these instructions.  Will watch your condition.  Will get help right away if you are not doing well or get worse.   This information is not intended to replace advice given to you by your health care provider. Make sure you discuss any questions you have with your health care provider.   Document Released: 12/01/2000 Document Revised: 10/22/2014 Document Reviewed: 09/12/2013 Elsevier Interactive Patient Education 2016 [employer]  

## 2015-09-04 NOTE — ED Provider Notes (Signed)
CSN: [id number]     Arrival date & time 09/04/15  4098 History   First MD Initiated Contact with Patient 09/04/15 0830     Chief Complaint  Patient presents with  . Cough  . Nasal Congestion   HPI  Jillian Reed is a 35 year old female presenting with cough and nasal congestion. Symptoms have been present for approximately 4-5 days. She is complaining of a cough productive of green sputum. She also notes nasal discharge that is greenish in color. She is complaining of throat irritation and ear pain. She states that the ear pain is bilateral and feels like a burning sensation. Denies hearing loss or ear drainage. No recent swimming. She also complains of a generalized, throbbing headache and generalized myalgias. She did not receive her flu shot this year. Her son is sick at home with similar symptoms. She has tried Alka-Seltzer cold and flu with no relief. Denies fevers, chills, dizziness, syncope, vision changes, neck pain, chest pain, shortness of breath, abdominal pain, nausea or vomiting.  Past Medical History  Diagnosis Date  . Vaginal Pap smear, abnormal   . Anemia   . Anxiety   . GERD (gastroesophageal reflux disease)   . Hypertension   . Pregnancy induced hypertension   . Headache   . Infection     UTI  . Depression     meds in teens; doing ok now  . Obesity    Past Surgical History  Procedure Laterality Date  . External ear surgery    . Therapeutic abortion     Family History  Problem Relation Age of Onset  . Diabetes Mother   . Hypertension Mother   . Arthritis Mother   . Hyperlipidemia Mother   . Diabetes Father   . Hypertension Father   . Heart disease Maternal Grandmother    Social History  Substance Use Topics  . Smoking status: Never Smoker   . Smokeless tobacco: Never Used  . Alcohol Use: Yes     Comment: occassionally when not pregnant   OB History    Gravida Para Term Preterm AB TAB SAB Ectopic Multiple Living   0 1     Review of Systems   Constitutional: Negative for fever and chills.  HENT: Positive for congestion, ear pain, rhinorrhea and sore throat. Negative for ear discharge and hearing loss.   Eyes: Negative for discharge and visual disturbance.  Respiratory: Positive for cough. Negative for shortness of breath.   Cardiovascular: Negative for chest pain.  Gastrointestinal: Negative for nausea, abdominal pain and diarrhea.  Musculoskeletal: Positive for myalgias. Negative for neck pain and neck stiffness.  Neurological: Positive for headaches. Negative for dizziness and syncope.  All other systems reviewed and are negative.     Allergies  Percocet and Tramadol  Home Medications   Prior to Admission medications   Medication Sig Start Date End Date Taking? Authorizing Provider  amLODipine (NORVASC) 10 MG tablet Take 10 mg by mouth daily.   Yes Historical Provider, MD  hydrochlorothiazide (HYDRODIURIL) 25 MG tablet Take 1 tablet (25 mg total) by mouth daily. 10/28/14  Yes Marny Lowenstein, PA-C  Prenatal Vit-Fe Fumarate-FA (PRENATAL VITAMIN PO) Take 1 tablet by mouth daily.    Yes Historical Provider, MD   BP 139/70 mmHg  Pulse 67  Temp(Src) 98.8 F (37.1 C) (Oral)  Resp 18  SpO2 100%  LMP 08/28/2015 Physical Exam  Constitutional: She appears well-developed and well-nourished. No distress.  Nontoxic  appearing  HENT:  Head: Normocephalic and atraumatic.  Right Ear: Tympanic membrane and ear canal normal.  Left Ear: Tympanic membrane and ear canal normal.  Nose: Nose normal. No mucosal edema or rhinorrhea. Right sinus exhibits no maxillary sinus tenderness and no frontal sinus tenderness. Left sinus exhibits no maxillary sinus tenderness and no frontal sinus tenderness.  Mouth/Throat: Uvula is midline and mucous membranes are normal. Mucous membranes are not dry. No uvula swelling. Posterior oropharyngeal erythema present. No oropharyngeal exudate, posterior oropharyngeal edema or tonsillar abscesses.  Eyes:  Conjunctivae are normal. Right eye exhibits no discharge. Left eye exhibits no discharge. No scleral icterus.  Neck: Normal range of motion. Neck supple.  Cardiovascular: Normal rate, regular rhythm and normal heart sounds.   Pulmonary/Chest: Effort normal and breath sounds normal. No respiratory distress.  Abdominal: Soft. There is no tenderness.  Musculoskeletal: Normal range of motion.  Lymphadenopathy:    She has no cervical adenopathy.  Neurological: She is alert. Coordination normal.  Skin: Skin is warm and dry.  Psychiatric: She has a normal mood and affect. Her behavior is normal.  Nursing note and vitals reviewed.   ED Course  Procedures (including critical care time) Labs Review Labs Reviewed - No data to display  Imaging Review Dg Chest 2 View  09/04/2015  CLINICAL DATA:  Cough for 4 days EXAM: CHEST  2 VIEW COMPARISON:  June 18, 2015 FINDINGS: There is slight posterior left base scarring. Lungs elsewhere are clear. Heart size and pulmonary vascularity are normal. No adenopathy. No bone lesions. IMPRESSION: Mild scarring left base.  No edema or consolidation. Electronically Signed   By: Bretta Bang III M.D.   On: 09/04/2015 09:42   I have personally reviewed and evaluated these images and lab results as part of my medical decision-making.   EKG Interpretation None      MDM   Final diagnoses:  URI (upper respiratory infection)   Patient presenting with cough, congestion, rhinorrhea, myalgias x 4 days. VSS. Pt is nontoxic appearing. No nasal musosal edema or sinus tenderness noted. TMs pearly gray without erythema or effusion. Oropharynx erythematous without edema or exudate. Lungs CTAB. CXR negative for acute infiltrate. Patients symptoms are consistent with URI, likely viral etiology. Discussed that antibiotics are not indicated for viral infections. Pt will be discharged with symptomatic treatment. Verbalizes understanding and is agreeable with plan. Pt is  hemodynamically stable & in NAD prior to dc.     Rolm Gala Mattalyn Anderegg, PA-C 09/04/15 0786  Arby Barrette, MD 09/05/15 [phone]

## 2015-09-04 NOTE — ED Notes (Signed)
Patient states cough with green sputum and nasal congestion x several days.   Patient states has been taking alka seltzer at home with no relief.   Patient states her son had it last week and now she has the same.

## 2015-11-01 ENCOUNTER — Emergency Department (HOSPITAL_COMMUNITY)
Admission: EM | Admit: 2015-11-01 | Discharge: 2015-11-01 | Disposition: A | Discharge status: Home / Self Care | Payer: Medicaid Other | Attending: Emergency Medicine | Admitting: Emergency Medicine

## 2015-11-01 ENCOUNTER — Encounter (HOSPITAL_COMMUNITY): Payer: Self-pay | Admitting: Emergency Medicine

## 2015-11-01 DIAGNOSIS — I10 Essential (primary) hypertension: Secondary | ICD-10-CM | POA: Diagnosis not present

## 2015-11-01 DIAGNOSIS — Y9289 Other specified places as the place of occurrence of the external cause: Secondary | ICD-10-CM | POA: Insufficient documentation

## 2015-11-01 DIAGNOSIS — Y998 Other external cause status: Secondary | ICD-10-CM | POA: Diagnosis not present

## 2015-11-01 DIAGNOSIS — E669 Obesity, unspecified: Secondary | ICD-10-CM | POA: Diagnosis not present

## 2015-11-01 DIAGNOSIS — Z8659 Personal history of other mental and behavioral disorders: Secondary | ICD-10-CM | POA: Insufficient documentation

## 2015-11-01 DIAGNOSIS — Z87440 Personal history of urinary (tract) infections: Secondary | ICD-10-CM | POA: Insufficient documentation

## 2015-11-01 DIAGNOSIS — X58XXXA Exposure to other specified factors, initial encounter: Secondary | ICD-10-CM | POA: Diagnosis not present

## 2015-11-01 DIAGNOSIS — Y9389 Activity, other specified: Secondary | ICD-10-CM | POA: Diagnosis not present

## 2015-11-01 DIAGNOSIS — S99922A Unspecified injury of left foot, initial encounter: Secondary | ICD-10-CM | POA: Diagnosis present

## 2015-11-01 DIAGNOSIS — Z862 Personal history of diseases of the blood and blood-forming organs and certain disorders involving the immune mechanism: Secondary | ICD-10-CM | POA: Insufficient documentation

## 2015-11-01 DIAGNOSIS — Z79899 Other long term (current) drug therapy: Secondary | ICD-10-CM | POA: Insufficient documentation

## 2015-11-01 DIAGNOSIS — Z8719 Personal history of other diseases of the digestive system: Secondary | ICD-10-CM | POA: Diagnosis not present

## 2015-11-01 MED ORDER — IBUPROFEN 400 MG PO TABS
800.0000 mg | ORAL_TABLET | Freq: Once | ORAL | Status: AC
  Administered 2015-11-01: 800 mg via ORAL
  Filled 2015-11-01: qty 2

## 2015-11-01 MED ORDER — IBUPROFEN 800 MG PO TABS: 800.0000 mg | ORAL_TABLET | Freq: Three times a day (TID) | ORAL | Status: DC

## 2015-11-01 NOTE — ED Notes (Signed)
Swelling noted to left second toe.  No open break in skin noted.

## 2015-11-01 NOTE — ED Provider Notes (Signed)
CSN: [id number]     Arrival date & time 11/01/15  1708 History  By signing my name below, I, Linna Darner, attest that this documentation has been prepared under the direction and in the presence of non-physician practitioner, Fayrene Helper, PA-C. Electronically Signed: Linna Darner, Scribe. 11/01/2015. 5:59 PM.   Chief Complaint  Patient presents with  . Toe Injury    The history is provided by the patient. No language interpreter was used.     HPI Comments: Jillian Reed is a 35 y.o. female who presents to the Emergency Department complaining of sudden onset, constant, 6/10, sharp, throbbing, left second toe pain beginning last night. Pt states that she went out and drank alcohol last night; when she woke up this morning her left second toe was in significant pain. Pt has not tried any medications for her pain. She has never injured her left foot or left toes in the past. Pt is allergic to Tramadol and Percocet. Pt denies pain in her left big toe, left ankle pain, numbness in her left toe, or any other pains at this time.   Past Medical History  Diagnosis Date  . Vaginal Pap smear, abnormal   . Anemia   . Anxiety   . GERD (gastroesophageal reflux disease)   . Hypertension   . Pregnancy induced hypertension   . Headache   . Infection     UTI  . Depression     meds in teens; doing ok now  . Obesity    Past Surgical History  Procedure Laterality Date  . External ear surgery    . Therapeutic abortion     Family History  Problem Relation Age of Onset  . Diabetes Mother   . Hypertension Mother   . Arthritis Mother   . Hyperlipidemia Mother   . Diabetes Father   . Hypertension Father   . Heart disease Maternal Grandmother    Social History  Substance Use Topics  . Smoking status: Never Smoker   . Smokeless tobacco: Never Used  . Alcohol Use: Yes     Comment: occassionally when not pregnant   OB History    Gravida Para Term Preterm AB TAB SAB Ectopic Multiple Living   2 1 1  1 1    0 1     Review of Systems  Musculoskeletal: Positive for arthralgias (left second toe).  Neurological: Negative for numbness.   Allergies  Percocet and Tramadol  Home Medications   Prior to Admission medications   Medication Sig Start Date End Date Taking? Authorizing Provider  amLODipine (NORVASC) 10 MG tablet Take 10 mg by mouth daily.    Historical Provider, MD  hydrochlorothiazide (HYDRODIURIL) 25 MG tablet Take 1 tablet (25 mg total) by mouth daily. 10/28/14   Marny Lowenstein, PA-C  Prenatal Vit-Fe Fumarate-FA (PRENATAL VITAMIN PO) Take 1 tablet by mouth daily.     Historical Provider, MD   BP 157/88 mmHg  Pulse 82  Temp(Src) 98.3 F (36.8 C) (Oral)  Resp 20  Ht 5\' 7"  (1.702 m)  Wt 390 lb (176.903 kg)  BMI 61.07 kg/m2  SpO2 97%  LMP 10/19/2015 Physical Exam  Constitutional: She is oriented to person, place, and time. She appears well-developed and well-nourished. No distress.  HENT:  Head: Normocephalic and atraumatic.  Eyes: Conjunctivae and EOM are normal.  Neck: Neck supple. No tracheal deviation present.  Cardiovascular: Normal rate.   Pulmonary/Chest: Effort normal. No respiratory distress.  Musculoskeletal: Normal range of motion.  Left  foot: tenderness noted to the second toe, most significant to the I-P joint with mild swelling but no obvious deformity. No bruising noted. No nail involvement.  Neurological: She is alert and oriented to person, place, and time.  Skin: Skin is warm and dry.  Psychiatric: She has a normal mood and affect. Her behavior is normal.  Nursing note and vitals reviewed.   ED Course  Procedures (including critical care time)  DIAGNOSTIC STUDIES: Oxygen Saturation is 97% on RA, normal by my interpretation.    COORDINATION OF CARE: 5:59 PM Discussed treatment plan with pt at bedside and pt agreed to plan.  MDM   Final diagnoses:  Toe injury, left, initial encounter   BP 157/88 mmHg  Pulse 82  Temp(Src) 98.3 F  (36.8 C) (Oral)  Resp 20  Ht  (1.702 m)  Wt 176.903 kg  BMI 61.07 kg/m2  SpO2 97%  LMP 10/19/2015  I personally performed the services described in this documentation, which was scribed in my presence. The recorded information has been reviewed and is accurate.     6:07 PM Pain to Left second toe, likely a sprain vs closed fx.  Will buddy tape toe, RICE and give definitive care instruction.    Fayrene Helper, PA-C 11/01/15 1808  Melene Plan, DO 11/01/15 [id number]

## 2015-11-01 NOTE — ED Notes (Signed)
Woke with swollen and painful left second toe.  Was out last night and does not recall injury but is sure she stubbed it on something because her toenail is broken

## 2015-11-01 NOTE — Discharge Instructions (Signed)
You have a toe injury, likely a sprain.  Buddy tape one toe to the next to provide stability and support.  Take ibuprofen as needed for pain.    RICE for Routine Care of Injuries Theroutine careofmanyinjuriesincludes rest, ice, compression, and elevation (RICE therapy). RICE therapy is often recommended for injuries to soft tissues, such as a muscle strain, ligament injuries, bruises, and overuse injuries. It can also be used for some bony injuries. Using RICE therapy can help to relieve pain, lessen swelling, and enable your body to heal. Rest Rest is required to allow your body to heal. This usually involves reducing your normal activities and avoiding use of the injured part of your body. Generally, you can return to your normal activities when you are comfortable and have been given permission by your health care provider. Ice Icing your injury helps to keep the swelling down, and it lessens pain. Do not apply ice directly to your skin.  Put ice in a plastic bag.  Place a towel between your skin and the bag.  Leave the ice on for 20 minutes, 2-3 times a day. Do this for as long as you are directed by your health care provider. Compression Compression means putting pressure on the injured area. Compression helps to keep swelling down, gives support, and helps with discomfort. Compression may be done with an elastic bandage. If an elastic bandage has been applied, follow these general tips:  Remove and reapply the bandage every 3-4 hours or as directed by your health care provider.  Make sure the bandage is not wrapped too tightly, because this can cut off circulation. If part of your body beyond the bandage becomes blue, numb, cold, swollen, or more painful, your bandage is most likely too tight. If this occurs, remove your bandage and reapply it more loosely.  See your health care provider if the bandage seems to be making your problems worse rather than better. Elevation Elevation  means keeping the injured area raised. This helps to lessen swelling and decrease pain. If possible, your injured area should be elevated at or above the level of your heart or the center of your chest. WHEN SHOULD I SEEK MEDICAL CARE? You should seek medical care if:  Your pain and swelling continue.  Your symptoms are getting worse rather than improving. These symptoms may indicate that further evaluation or further X-rays are needed. Sometimes, X-rays may not show a small broken bone (fracture) until a number of days later. Make a follow-up appointment with your health care provider. WHEN SHOULD I SEEK IMMEDIATE MEDICAL CARE? You should seek immediate medical care if:  You have sudden severe pain at or below the area of your injury.  You have redness or increased swelling around your injury.  You have tingling or numbness at or below the area of your injury that does not improve after you remove the elastic bandage.   This information is not intended to replace advice given to you by your health care provider. Make sure you discuss any questions you have with your health care provider.   Document Released: 09/19/2000 Document Revised: 02/26/2015 Document Reviewed: 05/15/2014 Elsevier Interactive Patient Education [employer]

## 2015-12-15 ENCOUNTER — Encounter (HOSPITAL_COMMUNITY): Payer: Self-pay | Admitting: *Deleted

## 2015-12-15 ENCOUNTER — Ambulatory Visit (HOSPITAL_COMMUNITY)
Admission: EM | Admit: 2015-12-15 | Discharge: 2015-12-15 | Disposition: A | Discharge status: Home / Self Care | Payer: Medicaid Other | Attending: Family Medicine | Admitting: Family Medicine

## 2015-12-15 DIAGNOSIS — S92911A Unspecified fracture of right toe(s), initial encounter for closed fracture: Secondary | ICD-10-CM

## 2015-12-15 NOTE — ED Provider Notes (Signed)
CSN: [id number]     Arrival date & time 12/15/15  1322 History   First MD Initiated Contact with Patient 12/15/15 1414     Chief Complaint  Patient presents with  . Toe Pain   (Consider location/radiation/quality/duration/timing/severity/associated sxs/prior Treatment) HPI History obtained from patient: Location:  Right foot pinky toe Context/Duration:  Last night foot struck an object I accident Severity: 3  Quality: Aching Timing:           Constant Home Treatment: Cold compresses Tylenol, ibuprofen Associated symptoms:  Hurts to ambulate Family History: Diabetes in mother    Past Medical History  Diagnosis Date  . Vaginal Pap smear, abnormal   . Anemia   . Anxiety   . GERD (gastroesophageal reflux disease)   . Hypertension   . Pregnancy induced hypertension   . Headache   . Infection     UTI  . Depression     meds in teens; doing ok now  . Obesity    Past Surgical History  Procedure Laterality Date  . External ear surgery    . Therapeutic abortion     Family History  Problem Relation Age of Onset  . Diabetes Mother   . Hypertension Mother   . Arthritis Mother   . Hyperlipidemia Mother   . Diabetes Father   . Hypertension Father   . Heart disease Maternal Grandmother    Social History  Substance Use Topics  . Smoking status: Never Smoker   . Smokeless tobacco: Never Used  . Alcohol Use: Yes     Comment: occassionally when not pregnant   OB History    Gravida Para Term Preterm AB TAB SAB Ectopic Multiple Living   0 0 0 0 0 0 1     Review of Systems  Denies: HEADACHE, NAUSEA, ABDOMINAL PAIN, CHEST PAIN, CONGESTION, DYSURIA, SHORTNESS OF BREATH  Allergies  Percocet and Tramadol  Home Medications   Prior to Admission medications   Medication Sig Start Date End Date Taking? Authorizing Provider  amLODipine (NORVASC) 10 MG tablet Take 10 mg by mouth daily.   Yes Historical Provider, MD  hydrochlorothiazide (HYDRODIURIL) 25 MG tablet Take 1  tablet (25 mg total) by mouth daily. 10/28/14   Marny Lowenstein, PA-C  ibuprofen (ADVIL,MOTRIN) 800 MG tablet Take 1 tablet (800 mg total) by mouth 3 (three) times daily. 11/01/15   Fayrene Helper, PA-C  Prenatal Vit-Fe Fumarate-FA (PRENATAL VITAMIN PO) Take 1 tablet by mouth daily.     Historical Provider, MD   Meds Ordered and Administered this Visit  Medications - No data to display  BP 144/98 mmHg  Pulse 87  Temp(Src) 98.2 F (36.8 C) (Oral)  Resp 17  SpO2 100%  LMP 12/15/2015 No data found.   Physical Exam NURSES NOTES AND VITAL SIGNS REVIEWED. CONSTITUTIONAL: Well developed, well nourished, no acute distress HEENT: normocephalic, atraumatic EYES: Conjunctiva normal NECK:normal ROM, supple, no adenopathy PULMONARY:No respiratory distress, normal effort ABDOMINAL: Soft, ND, NT BS+, No CVAT MUSCULOSKELETAL: Normal ROM of all extremities, right pinky toe is tender to palpation no visible or palpable deformity is noted. SKIN: warm and dry without rash PSYCHIATRIC: Mood and affect, behavior are normal  ED Course  Procedures (including critical care time)  Labs Review Labs Reviewed - No data to display  Imaging Review No results found.   Visual Acuity Review  Right Eye Distance:   Left Eye Distance:   Bilateral Distance:    Right Eye Near:   Left  Eye Near:    Bilateral Near:      I have discussed whether x-rays with change the diagnosis and treatment plan of a little toe fracture and patient has agreed that she does not want to have x-rays done at this time. She would like symptomatic treatment performed. He is advised to follow-up with her primary care provider if not steadily improving. He is told that it may take a couple weeks for this to heal completely but she should be pain free within the next 10 days.  Treatment plan has included buddy tape and cast boot and the symptomatic treatment at home. MDM   1. Toe fracture, right, closed, initial encounter     Patient  is reassured that there are no issues that require transfer to higher level of care at this time or additional tests. Patient is advised to continue home symptomatic treatment. Patient is advised that if there are new or worsening symptoms to attend the emergency department, contact primary care provider, or return to [facility]. Instructions of care provided discharged home in stable condition.    THIS NOTE WAS GENERATED USING A VOICE RECOGNITION SOFTWARE PROGRAM. ALL REASONABLE EFFORTS  WERE MADE TO PROOFREAD THIS DOCUMENT FOR ACCURACY.  I have verbally reviewed the discharge instructions with the patient. A printed AVS was given to the patient.  All questions were answered prior to discharge.      Tharon Aquas, PA 12/15/15 1821  Tharon Aquas, PA 12/15/15 Rickey Primus

## 2015-12-15 NOTE — Discharge Instructions (Signed)
Toe Fracture °A toe fracture is a break in one of the toe bones (phalanges). °HOME CARE °If You Have a Cast: °· Do not stick anything inside the cast to scratch your skin. °· Check the skin around the cast every day. Tell your doctor about any concerns. Do not put lotion on the skin underneath the cast. You may put lotion on dry skin around the edges of the cast. °· Do not put pressure on any part of the cast until it is fully hardened. This may take many hours. °· Keep the cast clean and dry. °Bathing °· Do not take baths, swim, or use a hot tub until your doctor says that you can. Ask your doctor if you can take showers. You may only be allowed to take sponge baths for bathing. °· If your doctor says that bathing and showering are okay, cover the cast or bandage (dressing) with a watertight plastic bag to protect it from water. Do not let the cast or bandage get wet. °Managing Pain, Stiffness, and Swelling °· If you do not have a cast, put ice on the injured area if told by your doctor: °¨ Put ice in a plastic bag. °¨ Place a towel between your skin and the bag. °¨ Leave the ice on for 20 minutes, 2-3 times per day. °· Move your toes often to avoid stiffness and to lessen swelling. °· Raise (elevate) the injured area above the level of your heart while you are sitting or lying down. °Driving °· Do not drive or use heavy machinery while taking pain medicine. °· Do not drive while wearing a cast on a foot that you use for driving. °Activity °· Return to your normal activities as told by your doctor. Ask your doctor what activities are safe for you. °· Perform exercises daily as told by your doctor or therapist. °Safety °· Do not use your leg to support your body weight until your doctor says that you can. Use crutches or other tools to help you move around as told by your doctor. °General Instructions °· If your toe was taped to a toe that is next to it (buddy taping), follow your doctor's instructions for changing  the gauze and tape. Change it more often: °¨ If the gauze and tape get wet. If this happens, dry the space between the toes. °¨ If the gauze and tape are too tight and they cause your toe to become pale or to lose feeling (numb). °· Wear a protective shoe as told by your doctor. If you were not given one, wear sturdy shoes that support your foot. Your shoes should not pinch your toes. Your shoes should not fit tightly against your toes. °· Do not use any tobacco products, including cigarettes, chewing tobacco, or e-cigarettes. Tobacco can delay bone healing. If you need help quitting, ask your doctor. °· Take medicines only as told by your doctor. °· Keep all follow-up visits as told by your doctor. This is important. °GET HELP IF: °· You have a fever. °· Your pain medicine is not helping. °· Your toe feels cold. °· You lose feeling (have numbness) in your toe. °· You still have pain after one week of rest and treatment. °· You still have pain after your doctor has said that you can start walking again. °· You have pain or tingling in your foot, and it is not going away. °· You have loss of feeling in your foot, and it is not going away. °GET   HELP RIGHT AWAY IF: °· You have severe pain. °· You have redness or swelling (inflammation) in your toe, and it is getting worse. °· You have pain or loss of feeling in your toe, and it is getting worse. °· Your toe is blue. °  °This information is not intended to replace advice given to you by your health care provider. Make sure you discuss any questions you have with your health care provider. °  °Document Released: 11/24/2007 Document Revised: 10/22/2014 Document Reviewed: 04/03/2014 °Elsevier Interactive Patient Education ©2016 [employer] ° °

## 2015-12-15 NOTE — ED Notes (Signed)
Patient reports hitting right pinky toe on pack and play yesterday, reports pain radiating up into foot. Reports decreased ability move due to pain. No deformity noted on exam.

## 2016-02-15 ENCOUNTER — Emergency Department (HOSPITAL_COMMUNITY)
Admission: EM | Admit: 2016-02-15 | Discharge: 2016-02-15 | Disposition: A | Discharge status: Home / Self Care | Payer: Medicaid Other | Attending: Emergency Medicine | Admitting: Emergency Medicine

## 2016-02-15 ENCOUNTER — Encounter (HOSPITAL_COMMUNITY): Payer: Self-pay | Admitting: Emergency Medicine

## 2016-02-15 DIAGNOSIS — I10 Essential (primary) hypertension: Secondary | ICD-10-CM | POA: Diagnosis not present

## 2016-02-15 DIAGNOSIS — J029 Acute pharyngitis, unspecified: Secondary | ICD-10-CM | POA: Insufficient documentation

## 2016-02-15 LAB — RAPID STREP SCREEN (MED CTR MEBANE ONLY): Streptococcus, Group A Screen (Direct): NEGATIVE

## 2016-02-15 NOTE — ED Triage Notes (Signed)
Ptl. Stated, I ve had a sore throat and earache for 2 days and I think I was running a fever last night.

## 2016-02-15 NOTE — Discharge Instructions (Signed)
Please use warm tea, ibuprofen or Tylenol as needed for discomfort. Please follow-up with primary care provider for reevaluation of symptoms persist, please return to emergency room immediately if they worsen.

## 2016-02-15 NOTE — ED Provider Notes (Signed)
[facility]-EMERGENCY DEPT Provider Note   CSN: [id number] Arrival date & time: 02/15/16  1228  By signing my name below, I, Jillian Reed, attest that this documentation has been prepared under the direction and in the presence of Newell RubbermaidJeffrey Aitana Burry, PA-C. Electronically Signed: Javier Dockerobert Ryan Reed, ER Scribe. 01/31/2016. 2:46 PM.   History   Chief Complaint Chief Complaint  Patient presents with  . Sore Throat  . Otalgia    The history is provided by the patient. No language interpreter was used.    HPI Comments: Jillian Reed is a 35 y.o. female who presents to the Emergency Department complaining of sore throat, rhinorrhea, and ear burning for two days. She also had a fever last night (102). Patient reports minor cough dry nonproductive. Denies chest pain or shortness of breath.   Past Medical History:  Diagnosis Date  . Anemia   . Anxiety   . Depression    meds in teens; doing ok now  . GERD (gastroesophageal reflux disease)   . Headache   . Hypertension   . Infection    UTI  . Obesity   . Pregnancy induced hypertension   . Vaginal Pap smear, abnormal     Patient Active Problem List   Diagnosis Date Noted  . Active labor 10/19/2014  . Obesity affecting pregnancy, antepartum   . Herpetic whitlow 08/29/2014  . Preexisting hypertension complicating pregnancy, antepartum 07/08/2014    Past Surgical History:  Procedure Laterality Date  . EXTERNAL EAR SURGERY    . THERAPEUTIC ABORTION      OB History    Gravida Para Term Preterm AB Living   2 2 2  0 0 1   SAB TAB Ectopic Multiple Live Births   0 0 0 0 1       Home Medications    Prior to Admission medications   Medication Sig Start Date End Date Taking? Authorizing Provider  amLODipine (NORVASC) 10 MG tablet Take 10 mg by mouth daily.    Historical Provider, MD  hydrochlorothiazide (HYDRODIURIL) 25 MG tablet Take 1 tablet (25 mg total) by mouth daily. 10/28/14   Marny LowensteinJulie N Wenzel, PA-C  ibuprofen (ADVIL,MOTRIN) 800  MG tablet Take 1 tablet (800 mg total) by mouth 3 (three) times daily. 11/01/15   Fayrene HelperBowie Tran, PA-C  Prenatal Vit-Fe Fumarate-FA (PRENATAL VITAMIN PO) Take 1 tablet by mouth daily.     Historical Provider, MD    Family History Family History  Problem Relation Age of Onset  . Diabetes Mother   . Hypertension Mother   . Arthritis Mother   . Hyperlipidemia Mother   . Diabetes Father   . Hypertension Father   . Heart disease Maternal Grandmother     Social History Social History  Substance Use Topics  . Smoking status: Never Smoker  . Smokeless tobacco: Never Used  . Alcohol use Yes     Comment: occassionally when not pregnant     Allergies   Percocet [oxycodone-acetaminophen] and Tramadol   Review of Systems Review of Systems  Constitutional: Positive for fever. Negative for chills.  HENT: Positive for congestion, ear pain, rhinorrhea and sore throat.   Respiratory: Negative for cough and shortness of breath.   Gastrointestinal: Negative for nausea and vomiting.    Physical Exam Updated Vital Signs BP 132/55 (BP Location: Left Arm)   Pulse 77   Temp 97.9 F (36.6 C) (Oral)   Resp 17   Ht 5\' 7"  (1.702 m)   Wt (!) 192.9 kg  LMP 02/10/2016   SpO2 100%   BMI 66.59 kg/m   Physical Exam  Constitutional: She is oriented to person, place, and time. She appears well-developed and well-nourished. No distress.  HENT:  Head: Normocephalic and atraumatic.  Bilateral tonsillar swelling and exudate, symmetrical. No signs of peritonsillar or retropharyngeal abscess, no swelling to the tongue, uvula midline and rises with phonation no signs of edema. No vesicles noted.  Eyes: Pupils are equal, round, and reactive to light.  Neck: Neck supple.  Cardiovascular: Normal rate.   Pulmonary/Chest: Effort normal. No respiratory distress.  Musculoskeletal: Normal range of motion.  Neurological: She is alert and oriented to person, place, and time. Coordination normal.  Skin: Skin is  warm and dry. She is not diaphoretic.  Psychiatric: She has a normal mood and affect. Her behavior is normal.  Nursing note and vitals reviewed.    ED Treatments / Results  Labs (all labs ordered are listed, but only abnormal results are displayed) Labs Reviewed  RAPID STREP SCREEN (NOT AT [facility])  CULTURE, GROUP A STREP [facility])    EKG  EKG Interpretation None       Radiology No results found.  Procedures Procedures (including critical care time)  Medications Ordered in ED Medications - No data to display   Initial Impression / Assessment and Plan / ED Course  I have reviewed the triage vital signs and the nursing notes.  Pertinent labs & imaging results that were available during my care of the patient were reviewed by me and considered in my medical decision making (see chart for details).  Clinical Course      Final Clinical Impressions(s) / ED Diagnoses   Final diagnoses:  Viral pharyngitis   Labs: Rapid strep negative  Imaging:  Consults:  Therapeutics:  Discharge Meds:   Assessment/Plan:  Pt presents with likely viral pharyngitis. No difficulty swallowing, drooling, dysphonia,muffled voice, stridor, swelling of the neck, trismus, mouth pain, swelling/ pain in submandibular area or floor of mouth ,assymetry of tonsils, or ulcerations; unlikely epiglottitis, PTA, submandibular space infection, retropharyngeal space infection, or HIV. Pt treated here in the ED with therapeutics listed above, given strict return precautions, PCP follow-up for re-evaluation if symptoms persist beyond 5-7 days in duration, return to the ED if they worsen. Pt verbalized understanding and agreement to today's plan and had no further questions or concerns at the time of discharge.      New Prescriptions New Prescriptions   No medications on file    I personally performed the services described in this documentation, which was scribed in my presence. The recorded  information has been reviewed and is accurate.        Eyvonne Mechanic, PA-C 02/15/16 1524    Marily Memos, MD 02/16/16 [phone]

## 2016-02-17 LAB — CULTURE, GROUP A STREP (THRC)

## 2016-03-04 ENCOUNTER — Ambulatory Visit (INDEPENDENT_AMBULATORY_CARE_PROVIDER_SITE_OTHER): Payer: Medicaid Other | Admitting: Family Medicine

## 2016-03-04 ENCOUNTER — Encounter: Payer: Self-pay | Admitting: Family Medicine

## 2016-03-04 VITALS — BP 131/77 | HR 91 | Temp 98.9°F | Ht 67.0 in | Wt >= 6400 oz

## 2016-03-04 DIAGNOSIS — I10 Essential (primary) hypertension: Secondary | ICD-10-CM

## 2016-03-04 DIAGNOSIS — D649 Anemia, unspecified: Secondary | ICD-10-CM

## 2016-03-04 DIAGNOSIS — Z7689 Persons encountering health services in other specified circumstances: Secondary | ICD-10-CM

## 2016-03-04 DIAGNOSIS — R252 Cramp and spasm: Secondary | ICD-10-CM | POA: Diagnosis present

## 2016-03-04 DIAGNOSIS — Z7189 Other specified counseling: Secondary | ICD-10-CM | POA: Diagnosis not present

## 2016-03-04 DIAGNOSIS — J302 Other seasonal allergic rhinitis: Secondary | ICD-10-CM | POA: Insufficient documentation

## 2016-03-04 DIAGNOSIS — K219 Gastro-esophageal reflux disease without esophagitis: Secondary | ICD-10-CM | POA: Insufficient documentation

## 2016-03-04 LAB — CBC
HEMATOCRIT: 36.1 % (ref 35.0–45.0)
HEMOGLOBIN: 11.8 g/dL (ref 11.7–15.5)
MCH: 26.2 pg — ABNORMAL LOW (ref 27.0–33.0)
MCHC: 32.7 g/dL (ref 32.0–36.0)
MCV: 80 fL (ref 80.0–100.0)
MPV: 9.8 fL (ref 7.5–12.5)
Platelets: 384 10*3/uL (ref 140–400)
RBC: 4.51 MIL/uL (ref 3.80–5.10)
RDW: 15.3 % — ABNORMAL HIGH (ref 11.0–15.0)
WBC: 9.6 10*3/uL (ref 3.8–10.8)

## 2016-03-04 LAB — TSH: TSH: 1.52 mIU/L

## 2016-03-04 NOTE — Assessment & Plan Note (Signed)
Personal history of anemia.  She endorses heavy menstrual cycles during appt.  Will obtain CBC.

## 2016-03-04 NOTE — Assessment & Plan Note (Addendum)
BMI 66.25.  Lipid, TSH, VitD ordered.  Encourage weight loss.  Patient to schedule appt for annual exam.  Will plan to discuss weight loss further.  Will discuss referral to Dr Gerilyn Pilgrim for nutrition counseling.  Will also plan to screen for sleep apnea at next appt given body habitus.  Also, given constellation of heavy periods, h/o HTN and body habitus will also plan to evaluate for PCOS.

## 2016-03-04 NOTE — Assessment & Plan Note (Signed)
Currently controlled off of medication.  Previously on Tribenzor.  Will continue to monitor off of medication.  BMI 66.25.  Highly recommend lifestyle modification and weight loss.  CMP, Lipid ordered.  Will contact with results

## 2016-03-04 NOTE — Assessment & Plan Note (Signed)
Patient w/ history of hypokalemia.  Concern for electrolyte abnormality vs restless leg syndrome.  Neurologic exam normal.  CMP ordered.  If normal, will consider Requip.

## 2016-03-04 NOTE — Progress Notes (Signed)
    Subjective: CC: new patient HPI: Jillian Reed is a 35 y.o. female presenting to clinic today to establish care. Concerns today include:  1. Hypertension Blood pressure at home: does not monitor Blood pressure today: 131/77 Meds: has not been on medication for several months. ROS: Denies headache, dizziness, visual changes, nausea, vomiting, chest pain, abdominal pain or shortness of breath.  2. LE restlessness Patient reports sensation of bugs crawling on her legs at night.  She reports that symptoms started 02/2014.  She notes that she uses a cold compress on legs at night which helps some so that she can fall asleep but does not resolve.  She feels likes she is having spasms in her legs currently.  Social History Reviewed: non smoker. FamHx and MedHx reviewed.  Please see EMR. Health Maintenance: flu shot due  ROS: Per HPI  Objective: Office vital signs reviewed. BP 131/77   Pulse 91   Temp 98.9 F (37.2 C) (Oral)   Ht 5\' 7"  (1.702 m)   Wt (!) 423 lb (191.9 kg)   LMP 02/10/2016   SpO2 100%   BMI 66.25 kg/m   Physical Examination:  General: Awake, alert, obese, No acute distress Cardio: regular rate and rhythm, S1S2 heard, no murmurs appreciated Pulm: clear to auscultation bilaterally, no wheezes, rhonchi or rales, normal WOB on room air Extremities: warm, well perfused, No edema, cyanosis or clubbing; +2 DP bilaterally MSK: Normal gait and station Skin: dry, intact, no rashes or lesions Neuro: Strength and light touch sensation grossly intact  Assessment/ Plan: 35 y.o. female   Essential hypertension Currently controlled off of medication.  Previously on Tribenzor.  Will continue to monitor off of medication.  BMI 66.25.  Highly recommend lifestyle modification and weight loss.  CMP, Lipid ordered.  Will contact with results  Morbid obesity (HCC) BMI 66.25.  Lipid, TSH, VitD ordered.  Encourage weight loss.  Patient to schedule appt for annual exam.  Will  plan to discuss weight loss further.  Will discuss referral to Dr Gerilyn Pilgrim for nutrition counseling.  Will also plan to screen for sleep apnea at next appt given body habitus.  Also, given constellation of heavy periods, h/o HTN and body habitus will also plan to evaluate for PCOS.  Anemia Personal history of anemia.  She endorses heavy menstrual cycles during appt.  Will obtain CBC.  Cramp of both lower extremities Patient w/ history of hypokalemia.  Concern for electrolyte abnormality vs restless leg syndrome.  Neurologic exam normal.  CMP ordered.  If normal, will consider Requip.  Follow up in next month for physical exam.  Raliegh Ip, DO PGY-3, [facility]

## 2016-03-04 NOTE — Patient Instructions (Signed)
Schedule an appointment with me for a full physical exam within the next couple of months.  I will contact you will the results of your labs.  If anything is abnormal, I will call you.  Otherwise, expect a copy to be mailed to you.

## 2016-03-05 ENCOUNTER — Telehealth: Payer: Self-pay | Admitting: Family Medicine

## 2016-03-05 ENCOUNTER — Other Ambulatory Visit: Payer: Self-pay | Admitting: Family Medicine

## 2016-03-05 ENCOUNTER — Encounter: Payer: Self-pay | Admitting: Family Medicine

## 2016-03-05 DIAGNOSIS — E559 Vitamin D deficiency, unspecified: Secondary | ICD-10-CM

## 2016-03-05 LAB — LIPID PANEL
Cholesterol: 184 mg/dL (ref 125–200)
HDL: 66 mg/dL (ref >=46)
LDL Cholesterol: 104 mg/dL (ref <130)
TRIGLYCERIDES: 72 mg/dL (ref <150)
Total CHOL/HDL Ratio: 2.8 Ratio (ref <=5.0)
VLDL: 14 mg/dL (ref <30)

## 2016-03-05 LAB — COMPLETE METABOLIC PANEL WITH GFR
ALT: 10 U/L (ref 6–29)
AST: 11 U/L (ref 10–30)
Albumin: 3.8 g/dL (ref 3.6–5.1)
Alkaline Phosphatase: 72 U/L (ref 33–115)
BUN: 11 mg/dL (ref 7–25)
CO2: 27 mmol/L (ref 20–31)
CREATININE: 0.88 mg/dL (ref 0.50–1.10)
Calcium: 8.8 mg/dL (ref 8.6–10.2)
Chloride: 104 mmol/L (ref 98–110)
GFR, EST NON AFRICAN AMERICAN: 86 mL/min (ref >=60)
GFR, Est African American: >89 mL/min (ref >=60)
Glucose, Bld: 97 mg/dL (ref 65–99)
POTASSIUM: 4.1 mmol/L (ref 3.5–5.3)
Sodium: 141 mmol/L (ref 135–146)
Total Bilirubin: 0.4 mg/dL (ref 0.2–1.2)
Total Protein: 6.9 g/dL (ref 6.1–8.1)

## 2016-03-05 LAB — VITAMIN D 25 HYDROXY (VIT D DEFICIENCY, FRACTURES): Vit D, 25-Hydroxy: 8 ng/mL — ABNORMAL LOW (ref 30–100)

## 2016-03-05 MED ORDER — VITAMIN D (ERGOCALCIFEROL) 1.25 MG (50000 UNIT) PO CAPS: 50000.0000 [IU] | ORAL_CAPSULE | ORAL | 0 refills | Status: DC

## 2016-03-05 NOTE — Telephone Encounter (Signed)
Attempted to call patient re: labs.  All were normal, except her Vitamin D level.  I will send in an 8 week course of Vitamin D for her to pick up at her pharmacy.  We will recheck this lab in 8 weeks when she has finished her course.  Please attempt to call patient again this afternoon.  I will also send a letter with the information.  Results for orders placed or performed in visit on 03/04/16 (from the past 24 hour(s))  Lipid panel     Status: None   Collection Time: 03/04/16  4:43 PM  Result Value Ref Range   Cholesterol 184 125 - 200 mg/dL   Triglycerides 72 <546 mg/dL   HDL 66 >=50 mg/dL   Total CHOL/HDL Ratio 2.8 <=5.0 Ratio   VLDL 14 <30 mg/dL   LDL Cholesterol 354 <656 mg/dL   Narrative   Performed at:  [facility]                [address], [address]                [place], [place] [address]  COMPLETE METABOLIC PANEL WITH GFR     Status: None   Collection Time: 03/04/16  4:43 PM  Result Value Ref Range   Sodium 141 135 - 146 mmol/L   Potassium 4.1 3.5 - 5.3 mmol/L   Chloride 104 98 - 110 mmol/L   CO2 27 20 - 31 mmol/L   Glucose, Bld 97 65 - 99 mg/dL   BUN 11 7 - 25 mg/dL   Creat 0.17 4.94 - 4.96 mg/dL   Total Bilirubin 0.4 0.2 - 1.2 mg/dL   Alkaline Phosphatase 72 33 - 115 U/L   AST 11 10 - 30 U/L   ALT 10 6 - 29 U/L   Total Protein 6.9 6.1 - 8.1 g/dL   Albumin 3.8 3.6 - 5.1 g/dL   Calcium 8.8 8.6 - 75.9 mg/dL   GFR, Est African American >89 >=60 mL/min   GFR, Est Non African American 86 >=60 mL/min   Narrative   Performed at:  [facility]                [address], [address]                [place], [place] [address]  TSH     Status: None   Collection Time: 03/04/16  4:43 PM  Result Value Ref Range   TSH 1.52 mIU/L   Narrative   Performed at:  [facility]                [address]                [place], [place] [address]  CBC     Status: Abnormal   Collection Time: 03/04/16  4:43 PM  Result Value Ref Range   WBC  9.6 3.8 - 10.8 K/uL   RBC 4.51 3.80 - 5.10 MIL/uL   Hemoglobin 11.8 11.7 - 15.5 g/dL   HCT 79.3 90.3 - 00.9 %   MCV 80.0 80.0 - 100.0 fL   MCH 26.2 (L) 27.0 - 33.0 pg   MCHC 32.7 32.0 - 36.0 g/dL   RDW 23.3 (H) 00.7 - 62.2 %   Platelets 384 140 - 400 K/uL   MPV 9.8 7.5 - 12.5 fL   Narrative   Performed at:  [facility]  [address], [address]                [place], [place] [address]  VITAMIN D 25 Hydroxy (Vit-D Deficiency, Fractures)     Status: Abnormal   Collection Time: 03/04/16  4:43 PM  Result Value Ref Range   Vit D, 25-Hydroxy 8 (L) 30 - 100 ng/mL   Narrative   Performed at:  [facility]                [address], [address]                [place], [place] [address]     Rozell SearingAshly M. Nadine CountsGottschalk, DO PGY-3, [facility]

## 2016-03-05 NOTE — Progress Notes (Signed)
Additionally, I'd like her to start taking a multivitamin WITH IRON to see if this helps her legs before starting Requip, as we had discussed during our visit for her leg cramping.  The iron may help with her symptoms.

## 2016-03-09 ENCOUNTER — Telehealth: Payer: Self-pay | Admitting: Family Medicine

## 2016-03-09 NOTE — Progress Notes (Signed)
Patient informed. Merie Wulf T Syrah Daughtrey, CMA  

## 2016-03-09 NOTE — Telephone Encounter (Signed)
Pt is returning Shari's call. I didn't see a message. jw

## 2016-03-09 NOTE — Progress Notes (Signed)
LVM for pt to call the office. Need to inform her that labs were normal except  her Vitamin D level is low. Rx has been called in for a 8 week course. Dr. Nadine Counts would like her to start taking a multivitamin WITH IRON to see if this helps her legs before starting Requip, as they had discussed during their visit for her leg cramping.  The iron may help with her symptoms. Sunday Spillers, CMA

## 2016-03-09 NOTE — Telephone Encounter (Signed)
LMOVM for pt to return call. Devanie Galanti Dawn, CMA  

## 2016-03-09 NOTE — Telephone Encounter (Signed)
Attempted to call patient re: labs.  All were normal, except her Vitamin D level.  I will send in an 8 week course of Vitamin D for her to pick up at her pharmacy.  We will recheck this lab in 8 weeks when she has finished her course.  Please attempt to call patient again this afternoon.  I will also send a letter with the information.   Ashly M. Nadine Counts, DO PGY-3, [facility]

## 2016-03-10 NOTE — Telephone Encounter (Signed)
Pt informed. Gatlin Kittell T Nijel Flink, CMA  

## 2016-03-18 ENCOUNTER — Encounter: Payer: Medicaid Other | Admitting: Family Medicine

## 2016-03-28 ENCOUNTER — Other Ambulatory Visit: Payer: Self-pay | Admitting: Family Medicine

## 2016-03-28 DIAGNOSIS — E559 Vitamin D deficiency, unspecified: Secondary | ICD-10-CM

## 2016-04-15 ENCOUNTER — Ambulatory Visit (INDEPENDENT_AMBULATORY_CARE_PROVIDER_SITE_OTHER): Payer: Medicaid Other | Admitting: Family Medicine

## 2016-04-15 ENCOUNTER — Encounter: Payer: Self-pay | Admitting: Family Medicine

## 2016-04-15 VITALS — BP 126/89 | HR 93 | Temp 98.8°F | Ht 67.0 in | Wt >= 6400 oz

## 2016-04-15 DIAGNOSIS — Z0000 Encounter for general adult medical examination without abnormal findings: Secondary | ICD-10-CM | POA: Diagnosis present

## 2016-04-15 DIAGNOSIS — G8929 Other chronic pain: Secondary | ICD-10-CM | POA: Diagnosis not present

## 2016-04-15 DIAGNOSIS — K449 Diaphragmatic hernia without obstruction or gangrene: Secondary | ICD-10-CM | POA: Diagnosis not present

## 2016-04-15 DIAGNOSIS — F329 Major depressive disorder, single episode, unspecified: Secondary | ICD-10-CM | POA: Diagnosis not present

## 2016-04-15 DIAGNOSIS — K219 Gastro-esophageal reflux disease without esophagitis: Secondary | ICD-10-CM

## 2016-04-15 DIAGNOSIS — M25562 Pain in left knee: Secondary | ICD-10-CM | POA: Diagnosis not present

## 2016-04-15 DIAGNOSIS — M79606 Pain in leg, unspecified: Secondary | ICD-10-CM | POA: Insufficient documentation

## 2016-04-15 DIAGNOSIS — Z23 Encounter for immunization: Secondary | ICD-10-CM

## 2016-04-15 DIAGNOSIS — F32A Depression, unspecified: Secondary | ICD-10-CM

## 2016-04-15 MED ORDER — SERTRALINE HCL 50 MG PO TABS: 50.0000 mg | ORAL_TABLET | Freq: Every day | ORAL | 0 refills | Status: DC

## 2016-04-15 NOTE — Assessment & Plan Note (Addendum)
Referral to Dr Gerilyn Pilgrim placed.  Patient to call for appt.  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  Wants to have bariatric surgery.  We will further discuss that at our next appt in 2 weeks.  Order placed for sleep study. ?OHS vs OSA
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
## 2016-04-15 NOTE — Assessment & Plan Note (Addendum)
Concern for patellar subluxation.  Appears to laterally track on exam.  No other abnormalities appreciated.  Referral to orthopedics.  Though likely needs weight loss, knee immobilizer and quad strengthening.  Ok to continue NSAIDs prn.

## 2016-04-15 NOTE — Assessment & Plan Note (Signed)
Patient reported.  Referral to GI for further evaluation and possible repeat EGD.

## 2016-04-15 NOTE — Assessment & Plan Note (Addendum)
PHQ-9 score 20.  GAD-7 score 19, so will avoid Wellbutrin for now, though it would likely help with her weight.  Did well on Zoloft previously.  Will restart on Zoloft 50mg  daily.  Patient to follow up in 2 weeks.

## 2016-04-15 NOTE — Assessment & Plan Note (Signed)
Referral to GI.  PPIs not helping at this time.  GERD likely secondary to hiatal hernia/ obesity.  Weight loss recommended.  Patient to consider Zantac.

## 2016-04-15 NOTE — Patient Instructions (Signed)
Plan to see me again in 2 weeks for depression and weight loss. I think that you would greatly benefit from seeing a nutritionist.  Please call Dr Gerilyn PilgrimSykes at [phone] to schedule an appointment.  Major Depressive Disorder Major depressive disorder is a mental illness. It also may be called clinical depression or unipolar depression. Major depressive disorder usually causes feelings of sadness, hopelessness, or helplessness. Some people with this disorder do not feel particularly sad but lose interest in doing things they used to enjoy (anhedonia). Major depressive disorder also can cause physical symptoms. It can interfere with work, school, relationships, and other normal everyday activities. The disorder varies in severity but is longer lasting and more serious than the sadness we all feel from time to time in our lives. Major depressive disorder often is triggered by stressful life events or major life changes. Examples of these triggers include divorce, loss of your job or home, a move, and the death of a family member or close friend. Sometimes this disorder occurs for no obvious reason at all. People who have family members with major depressive disorder or bipolar disorder are at higher risk for developing this disorder, with or without life stressors. Major depressive disorder can occur at any age. It may occur just once in your life (single episode major depressive disorder). It may occur multiple times (recurrent major depressive disorder). SYMPTOMS People with major depressive disorder have either anhedonia or depressed mood on nearly a daily basis for at least 2 weeks or longer. Symptoms of depressed mood include:  Feelings of sadness (blue or down in the dumps) or emptiness.  Feelings of hopelessness or helplessness.  Tearfulness or episodes of crying (may be observed by others).  Irritability (children and adolescents). In addition to depressed mood or anhedonia or both, people with  this disorder have at least four of the following symptoms:  Difficulty sleeping or sleeping too much.   Significant change (increase or decrease) in appetite or weight.   Lack of energy or motivation.  Feelings of guilt and worthlessness.   Difficulty concentrating, remembering, or making decisions.  Unusually slow movement (psychomotor retardation) or restlessness (as observed by others).   Recurrent wishes for death, recurrent thoughts of self-harm (suicide), or a suicide attempt. People with major depressive disorder commonly have persistent negative thoughts about themselves, other people, and the world. People with severe major depressive disorder may experiencedistorted beliefs or perceptions about the world (psychotic delusions). They also may see or hear things that are not real (psychotic hallucinations). DIAGNOSIS Major depressive disorder is diagnosed through an assessment by your health care provider. Your health care provider will ask aboutaspects of your daily life, such as mood,sleep, and appetite, to see if you have the diagnostic symptoms of major depressive disorder. Your health care provider may ask about your medical history and use of alcohol or drugs, including prescription medicines. Your health care provider also may do a physical exam and blood work. This is because certain medical conditions and the use of certain substances can cause major depressive disorder-like symptoms (secondary depression). Your health care provider also may refer you to a mental health specialist for further evaluation and treatment. TREATMENT It is important to recognize the symptoms of major depressive disorder and seek treatment. The following treatments can be prescribed for this disorder:   Medicine. Antidepressant medicines usually are prescribed. Antidepressant medicines are thought to correct chemical imbalances in the brain that are commonly associated with major depressive  disorder. Other types of  medicine may be added if the symptoms do not respond to antidepressant medicines alone or if psychotic delusions or hallucinations occur.  Talk therapy. Talk therapy can be helpful in treating major depressive disorder by providing support, education, and guidance. Certain types of talk therapy also can help with negative thinking (cognitive behavioral therapy) and with relationship issues that trigger this disorder (interpersonal therapy). A mental health specialist can help determine which treatment is best for you. Most people with major depressive disorder do well with a combination of medicine and talk therapy. Treatments involving electrical stimulation of the brain can be used in situations with extremely severe symptoms or when medicine and talk therapy do not work over time. These treatments include electroconvulsive therapy, transcranial magnetic stimulation, and vagal nerve stimulation.   This information is not intended to replace advice given to you by your health care provider. Make sure you discuss any questions you have with your health care provider.   Document Released: 10/02/2012 Document Revised: 06/28/2014 Document Reviewed: 10/02/2012 Elsevier Interactive Patient Education [employer]

## 2016-04-16 ENCOUNTER — Encounter: Payer: Self-pay | Admitting: Gastroenterology

## 2016-04-29 ENCOUNTER — Ambulatory Visit: Payer: Medicaid Other | Admitting: Family Medicine

## 2016-05-06 ENCOUNTER — Ambulatory Visit (INDEPENDENT_AMBULATORY_CARE_PROVIDER_SITE_OTHER): Payer: Medicaid Other | Admitting: Orthopaedic Surgery

## 2016-05-06 ENCOUNTER — Ambulatory Visit (INDEPENDENT_AMBULATORY_CARE_PROVIDER_SITE_OTHER): Payer: Medicaid Other

## 2016-05-06 DIAGNOSIS — G8929 Other chronic pain: Secondary | ICD-10-CM

## 2016-05-06 DIAGNOSIS — M25562 Pain in left knee: Secondary | ICD-10-CM
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
## 2016-05-07 ENCOUNTER — Other Ambulatory Visit (INDEPENDENT_AMBULATORY_CARE_PROVIDER_SITE_OTHER): Payer: Self-pay

## 2016-05-07 DIAGNOSIS — M25562 Pain in left knee: Principal | ICD-10-CM

## 2016-05-07 DIAGNOSIS — G8929 Other chronic pain: Secondary | ICD-10-CM

## 2016-05-25 ENCOUNTER — Ambulatory Visit
Admission: RE | Admit: 2016-05-25 | Discharge: 2016-05-25 | Disposition: A | Discharge status: Home / Self Care | Payer: Medicaid Other | Source: Ambulatory Visit | Attending: Orthopaedic Surgery | Admitting: Orthopaedic Surgery

## 2016-05-25 DIAGNOSIS — M25562 Pain in left knee: Principal | ICD-10-CM

## 2016-05-25 DIAGNOSIS — G8929 Other chronic pain: Secondary | ICD-10-CM

## 2016-05-27 ENCOUNTER — Ambulatory Visit (INDEPENDENT_AMBULATORY_CARE_PROVIDER_SITE_OTHER): Payer: Medicaid Other | Admitting: Orthopaedic Surgery

## 2016-05-27 DIAGNOSIS — M25562 Pain in left knee: Secondary | ICD-10-CM

## 2016-05-27 DIAGNOSIS — G8929 Other chronic pain: Secondary | ICD-10-CM
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
## 2016-06-15 ENCOUNTER — Encounter: Payer: Self-pay | Admitting: Gastroenterology

## 2016-06-15 ENCOUNTER — Ambulatory Visit (INDEPENDENT_AMBULATORY_CARE_PROVIDER_SITE_OTHER): Payer: Medicaid Other | Admitting: Gastroenterology

## 2016-06-15 ENCOUNTER — Other Ambulatory Visit (INDEPENDENT_AMBULATORY_CARE_PROVIDER_SITE_OTHER): Payer: Medicaid Other

## 2016-06-15 VITALS — BP 132/84 | HR 76 | Ht 67.0 in | Wt >= 6400 oz

## 2016-06-15 DIAGNOSIS — R197 Diarrhea, unspecified: Secondary | ICD-10-CM

## 2016-06-15 DIAGNOSIS — K219 Gastro-esophageal reflux disease without esophagitis: Secondary | ICD-10-CM | POA: Diagnosis not present

## 2016-06-15 DIAGNOSIS — R1010 Upper abdominal pain, unspecified: Secondary | ICD-10-CM

## 2016-06-15 LAB — IGA: IgA: 334 mg/dL (ref 68–378)

## 2016-06-15 MED ORDER — DICYCLOMINE HCL 10 MG PO CAPS: 10.0000 mg | ORAL_CAPSULE | Freq: Three times a day (TID) | ORAL | 11 refills | Status: DC

## 2016-06-15 MED ORDER — RANITIDINE HCL 300 MG PO CAPS: 300.0000 mg | ORAL_CAPSULE | Freq: Every evening | ORAL | 11 refills | Status: DC

## 2016-06-15 MED ORDER — PANTOPRAZOLE SODIUM 40 MG PO TBEC: 40.0000 mg | DELAYED_RELEASE_TABLET | Freq: Two times a day (BID) | ORAL | 4 refills | Status: DC

## 2016-06-15 NOTE — Progress Notes (Signed)
History of Present Illness: This is a 35 year old female referred by Jillian Ip, DO for the evaluation of GERD, nausea and diarrhea. Patient relates a long history of GERD since around 2000. She's states she was evaluated at [facility] and underwent EGD about 2003 that showed a hiatal hernia. She does not recall any other findings. He states she has tried Prevacid, Zantac and other acid reducing medications (however she does not recall their names) without significant relief in symptoms. She has significant reflux and regurgitation particularly at night and she states she generally tries to sleep on 3 or 4 pillows to help with symptoms. She is currently not taking any acid reducing medications. Over the past 2 years she has had urgent postprandial nonbloody diarrhea associated with upper abdominal cramping. These symptoms have not changed substantially over the past 2 years. She is several months postpartum. She states she intentionally lost about 65 pounds prior to her pregnancy. Recent CBC, CMP, TSH unremarkable. Denies constipation, change in stool caliber, melena, hematochezia, vomiting, dysphagia, chest pain.   Allergies  Allergen Reactions  . Percocet [Oxycodone-Acetaminophen] Itching  . Tramadol Itching and Other (See Comments)    Causes Paranoia and insomnia   Outpatient Medications Prior to Visit  Medication Sig Dispense Refill  . sertraline (ZOLOFT) 50 MG tablet Take 1 tablet (50 mg total) by mouth daily. 30 tablet 0  . Vitamin D, Ergocalciferol, (DRISDOL) 50000 units CAPS capsule Take 1 capsule (50,000 Units total) by mouth every 7 (seven) days. 8 capsule 0   No facility-administered medications prior to visit.    Past Medical History:  Diagnosis Date  . Anemia   . Anxiety   . Depression    meds in teens; doing ok now  . GERD (gastroesophageal reflux disease)   . Headache   . Hypertension   . Infection    UTI  . Obesity   . Pregnancy induced hypertension   .  Vaginal Pap smear, abnormal    Past Surgical History:  Procedure Laterality Date  . EXTERNAL EAR SURGERY    . THERAPEUTIC ABORTION     Social History   Social History  . Marital status: Single    Spouse name: N/A  . Number of children: 2  . Years of education: N/A   Occupational History  . baker    Social History Main Topics  . Smoking status: Never Smoker  . Smokeless tobacco: Never Used  . Alcohol use 1.2 oz/week    2 Glasses of wine per week     Comment: occassionally when not pregnant  . Drug use: No  . Sexual activity: Yes    Partners: Male    Birth control/ protection: None   Other Topics Concern  . None   Social History Narrative  . None   Family History  Problem Relation Age of Onset  . Diabetes Mother   . Hypertension Mother   . Arthritis Mother   . Hyperlipidemia Mother   . Diabetes Father   . Hypertension Father   . Depression Father   . Heart disease Maternal Grandmother       Review of Systems: Pertinent positive and negative review of systems were noted in the above HPI section. All other review of systems were otherwise negative.   Physical Exam: General: Well developed, well nourished, morbid obesity, no acute distress Head: Normocephalic and atraumatic Eyes:  sclerae anicteric, EOMI Ears: Normal auditory acuity Mouth: No deformity or lesions Neck: Supple, no  masses or thyromegaly Lungs: Clear throughout to auscultation Heart: Regular rate and rhythm; no murmurs, rubs or bruits Abdomen: Soft, large, mild upper abdominal tenderness and non distended. No masses, hepatosplenomegaly or hernias noted. Normal Bowel sounds Rectal: deferred  Musculoskeletal: Symmetrical with no gross deformities  Skin: No lesions on visible extremities Pulses:  Normal pulses noted Extremities: No clubbing, cyanosis, edema or deformities noted Neurological: Alert oriented x 4, grossly nonfocal Cervical Nodes:  No significant cervical adenopathy Inguinal  Nodes: No significant inguinal adenopathy Psychological:  Alert and cooperative. Normal mood and affect  Assessment and Recommendations:  1. GERD, nausea. Protonix 40 bid ac and ranitidine 300 mg hs. Follow all antireflux measures. Use 4 " bed blocks. Request records including prior EGD from [facility]. REV in 2 months.   2. Diarrhea and upper abdominal pain. Presumed IBS-D. R/O lactose intolerance, infection, etc. Stool for GI pathogen panel, fecal elastase. IgA and tTG. Lactose free diet for 7 days. Dicyclomine 10 mg ac and hs. Consider abd US and low FODMAP diet as next steps. REV in 2 months.   3. Morbid obesity. Encouraged ongoing wt loss program and consideration of bariatric surgery evaluation.    cc: Jillian IpAshly M Gottschalk, DO [address] [place], [place] [id number]

## 2016-06-15 NOTE — Patient Instructions (Signed)
Your physician has requested that you go to the basement for lab work before leaving today.  We have sent the following medications to your pharmacy for you to pick up at your convenience: Protonix 40 mg twice a day Ranitidine 300 mg at bedtime Dicyclomine 10 mg with meals and at bedtime  For the next one week, please follow a strict Lactose free diet.

## 2016-06-16 ENCOUNTER — Telehealth: Payer: Self-pay | Admitting: Gastroenterology

## 2016-06-16 LAB — TISSUE TRANSGLUTAMINASE, IGA: TISSUE TRANSGLUTAMINASE AB, IGA: 1 U/mL (ref <4)

## 2016-06-17 NOTE — Telephone Encounter (Signed)
Patient notified of the results  

## 2016-06-18 ENCOUNTER — Encounter: Payer: Self-pay | Admitting: Pulmonary Disease

## 2016-06-18 ENCOUNTER — Other Ambulatory Visit: Payer: Medicaid Other

## 2016-06-18 ENCOUNTER — Ambulatory Visit (INDEPENDENT_AMBULATORY_CARE_PROVIDER_SITE_OTHER): Payer: Medicaid Other | Admitting: Pulmonary Disease

## 2016-06-18 VITALS — BP 110/80 | HR 88 | Ht 67.0 in | Wt >= 6400 oz

## 2016-06-18 DIAGNOSIS — G4710 Hypersomnia, unspecified: Secondary | ICD-10-CM | POA: Insufficient documentation

## 2016-06-18 DIAGNOSIS — K219 Gastro-esophageal reflux disease without esophagitis: Secondary | ICD-10-CM

## 2016-06-18 DIAGNOSIS — R197 Diarrhea, unspecified: Secondary | ICD-10-CM

## 2016-06-18 NOTE — Assessment & Plan Note (Signed)
She has snoring, gasping, choking. She has difficulty falling asleep. Has frequent awakenings at night. Has witnessed apneas occasionally. Sleeps 5-6 hrs per night. Has naps in am and pm with her 15 month old baby boy. Has hypersomnia affecting her fxnality.  Has sleep talking.   ESS 9.   Patient is scheduled to see a bariatric surgeon.  Plan :  We discussed about the diagnosis of Obstructive Sleep Apnea (OSA) and implications of untreated OSA. We discussed about CPAP and BiPaP as possible treatment options.    We will schedule the patient for a sleep study. Plan for a split-night sleep study. Patient has Medicaid. Anticipate no issues with CPAP. She has a friend who uses CPAP. Patient is scheduled to have bariatric surgery.   Patient was instructed to call the office if he/she has not heard back from the office 1-2 weeks after the sleep study.   Patient was instructed to call the office if he/she is having issues with the PAP device.   We discussed good sleep hygiene.   Patient was advised not to engage in activities requiring concentration and/or vigilance if he/she is sleepy.  Patient was advised not to drive if he/she is sleepy.

## 2016-06-18 NOTE — Progress Notes (Signed)
Subjective:    Patient ID: Jillian Reed, female    DOB: 11/05/1980, 35 y.o.   MRN: [id number]  HPI   This is the case of Jillian Reed, 35 y.o. Female, who was referred by Dr. Delynn Flavin  in consultation regarding possible OSA.   As you very well know, patient is a non smoker, not known to have asthma or copd.   Patient Is to be seen by bariatric surgeon but needed a sleep study and sleep apnea ruled out prior to that visit.  She has snoring, gasping, choking. She has difficulty falling asleep. Has frequent awakenings at night. Has witnessed apneas occasionally. Sleeps 5-6 hrs per night. Has naps in am and pm with her 81 month old baby boy. Has hypersomnia affecting her fxnality.  Has sleep talking.   ESS 9.    Review of Systems  Constitutional: Positive for unexpected weight change. Negative for fever.  HENT: Positive for sinus pressure. Negative for congestion, dental problem, ear pain, nosebleeds, postnasal drip, rhinorrhea, sneezing, sore throat and trouble swallowing.   Eyes: Negative.  Negative for redness and itching.  Respiratory: Positive for shortness of breath and wheezing. Negative for cough and chest tightness.   Cardiovascular: Negative for palpitations and leg swelling.  Gastrointestinal: Positive for nausea. Negative for vomiting.  Endocrine: Negative.   Genitourinary: Negative.  Negative for dysuria.  Musculoskeletal: Negative for joint swelling.  Skin: Negative for rash.  Allergic/Immunologic: Negative.   Neurological: Positive for headaches.  Hematological: Bruises/bleeds easily.  Psychiatric/Behavioral: Negative.  Negative for dysphoric mood. The patient is not nervous/anxious.    Past Medical History:  Diagnosis Date  . Anemia   . Anxiety   . Depression    meds in teens; doing ok now  . GERD (gastroesophageal reflux disease)   . Headache   . Hypertension   . Infection    UTI  . Obesity   . Pregnancy induced hypertension   . Vaginal Pap  smear, abnormal    (-) CA, DVT  Family History  Problem Relation Age of Onset  . Diabetes Mother   . Hypertension Mother   . Arthritis Mother   . Hyperlipidemia Mother   . Diabetes Father   . Hypertension Father   . Depression Father   . Heart disease Maternal Grandmother      Past Surgical History:  Procedure Laterality Date  . EXTERNAL EAR SURGERY    . THERAPEUTIC ABORTION      Social History   Social History  . Marital status: Single    Spouse name: N/A  . Number of children: 2  . Years of education: N/A   Occupational History  . baker    Social History Main Topics  . Smoking status: Never Smoker  . Smokeless tobacco: Never Used  . Alcohol use 1.2 oz/week    2 Glasses of wine per week     Comment: occassionally when not pregnant  . Drug use: No  . Sexual activity: Yes    Partners: Male    Birth control/ protection: None   Other Topics Concern  . Not on file   Social History Narrative  . 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   Allergies  Allergen Reactions  . Percocet [Oxycodone-Acetaminophen] Itching  . Tramadol Itching and Other (See Comments)    Causes Paranoia and insomnia     Outpatient Medications Prior to Visit  Medication Sig Dispense Refill  . dicyclomine (BENTYL) 10  MG capsule Take 1 capsule (10 mg total) by mouth 4 (four) times daily - after meals and at bedtime. 120 capsule 11  . pantoprazole (PROTONIX) 40 MG tablet Take 1 tablet (40 mg total) by mouth 2 (two) times daily. 90 tablet 4  . ranitidine (ZANTAC) 300 MG capsule Take 1 capsule (300 mg total) by mouth every evening. 30 capsule 11  . sertraline (ZOLOFT) 50 MG tablet Take 1 tablet (50 mg total) by mouth daily. 30 tablet 0  . Vitamin D, Ergocalciferol, (DRISDOL) 50000 units CAPS capsule Take 1 capsule (50,000 Units total) by mouth every 7 (seven) days. 8 capsule 0   No facility-administered medications prior to visit.    No orders of the defined types were placed  in this encounter.        Objective:   Physical Exam  Vitals:  Vitals:   06/18/16 1605  BP: 110/80  Pulse: 88  SpO2: 100%  Weight: (!) 419 lb (190.1 kg)  Height: 5\' 7"  (1.702 m)    Constitutional/General:  Pleasant, well-nourished, well-developed, not in any distress,  Comfortably seating.  Well kempt  Body mass index is 65.62 kg/m. Wt Readings from Last 3 Encounters:  06/18/16 (!) 419 lb (190.1 kg)  06/15/16 (!) 414 lb (187.8 kg)  04/15/16 (!) 427 lb 3.2 oz (193.8 kg)       HEENT: Pupils equal and reactive to light and accommodation. Anicteric sclerae. Normal nasal mucosa.   No oral  lesions,  mouth clear,  oropharynx clear, no postnasal drip. (-) Oral thrush. No dental caries.  Airway - Mallampati class III-IV  Neck: No masses. Midline trachea. No JVD, (-) LAD. (-) bruits appreciated.  Respiratory/Chest: Grossly normal chest. (-) deformity. (-) Accessory muscle use.  Symmetric expansion. (-) Tenderness on palpation.  Resonant on percussion.  Diminished BS on both lower lung zones. (-) wheezing, crackles, rhonchi (-) egophony  Cardiovascular: Regular rate and  rhythm, heart sounds normal, no murmur or gallops, no peripheral edema  Gastrointestinal:  Normal bowel sounds. Soft, non-tender. No hepatosplenomegaly.  (-) masses.   Musculoskeletal:  Normal muscle tone. Normal gait.   Extremities: Grossly normal. (-) clubbing, cyanosis.  (-) edema  Skin: (-) rash,lesions seen.   Neurological/Psychiatric : alert, oriented to time, place, person. Normal mood and affect          Assessment & Plan:  Hypersomnia She has snoring, gasping, choking. She has difficulty falling asleep. Has frequent awakenings at night. Has witnessed apneas occasionally. Sleeps 5-6 hrs per night. Has naps in am and pm with her 36 month old baby boy. Has hypersomnia affecting her fxnality.  Has sleep talking.   ESS 9.   Patient is scheduled to see a bariatric surgeon.  Plan  :  We discussed about the diagnosis of Obstructive Sleep Apnea (OSA) and implications of untreated OSA. We discussed about CPAP and BiPaP as possible treatment options.    We will schedule the patient for a sleep study. Plan for a split-night sleep study. Patient has Medicaid. Anticipate no issues with CPAP. She has a friend who uses CPAP. Patient is scheduled to have bariatric surgery.   Patient was instructed to call the office if he/she has not heard back from the office 1-2 weeks after the sleep study.   Patient was instructed to call the office if he/she is having issues with the PAP device.   We discussed good sleep hygiene.   Patient was advised not to engage in activities requiring concentration and/or vigilance  if he/she is sleepy.  Patient was advised not to drive if he/she is sleepy.    Morbid obesity (HCC) Weight reduction. Patient is scheduled to see a bariatric surgeon.    Thank you very much for letting me participate in this patient's care. Please do not hesitate to give me a call if you have any questions or concerns regarding the treatment plan.   Patient will follow up with me in 6-8 weeks.     Pollie MeyerJ. Angelo A. de Dios, MD 06/18/2016   4:28 PM Pulmonary and Critical Care Medicine [facility] Pager: [phone] Office: [phone], Fax: [phone]

## 2016-06-18 NOTE — Assessment & Plan Note (Signed)
Weight reduction. Patient is scheduled to see a bariatric surgeon.

## 2016-06-18 NOTE — Patient Instructions (Signed)
It was a pleasure taking care of you today!  We will schedule you to have a sleep study to determine if you have sleep apnea.    We will get a lab sleep study.  You will be scheduled to have a lab sleep study in 4-6 weeks.  Someone from the sleep lab will call you in 2-3 days to schedule the study with you.  They usually have cancellations every night so most likely, they will have openings for a lab sleep study next week or so.  We encourage you to do your sleep study then if possible. Please give us a call in a week is no one from the sleep lab calls you in 2-3 days.   If the sleep study is positive, we will order you a CPAP  machine.  Please call the office if you do NOT receive your machine in the next 1-2 weeks.   Please make sure you use your CPAP device everytime you sleep.  We will monitor the usage of your machine per your insurance requirement.  Your insurance company may take the machine from you if you are not using it regularly.   Please clean the mask, tubings, filter, water reservoir with soapy water every week.  Please use distilled water for the water reservoir.   Please call the office or your machine provider (DME company) if you are having issues with the device.    Return to clinic in 6-8 weeks with Dr. De Dios or NP   

## 2016-06-25 ENCOUNTER — Other Ambulatory Visit: Payer: Self-pay

## 2016-06-25 DIAGNOSIS — R197 Diarrhea, unspecified: Secondary | ICD-10-CM

## 2016-06-25 LAB — GASTROINTESTINAL PATHOGEN PANEL PCR

## 2016-06-27 LAB — PANCREATIC ELASTASE, FECAL: Pancreatic Elastase-1, Stool: >500 mcg/g

## 2016-07-05 ENCOUNTER — Telehealth: Payer: Self-pay | Admitting: Gastroenterology

## 2016-07-05 NOTE — Telephone Encounter (Signed)
Patient notified to change dicyclomine to prn.  She was to come back and submit another stool specimen.  She is reminded to return to give another sample.

## 2016-07-15 ENCOUNTER — Other Ambulatory Visit: Payer: Medicaid Other

## 2016-07-15 DIAGNOSIS — R197 Diarrhea, unspecified: Secondary | ICD-10-CM

## 2016-07-16 LAB — GASTROINTESTINAL PATHOGEN PANEL PCR
C. DIFFICILE TOX A/B, PCR: NOT DETECTED
CAMPYLOBACTER, PCR: NOT DETECTED
CRYPTOSPORIDIUM, PCR: NOT DETECTED
E COLI 0157, PCR: NOT DETECTED
E coli (ETEC) LT/ST PCR: NOT DETECTED
E coli (STEC) stx1/stx2, PCR: NOT DETECTED
GIARDIA LAMBLIA, PCR: NOT DETECTED
Norovirus, PCR: NOT DETECTED
Rotavirus A, PCR: NOT DETECTED
SALMONELLA, PCR: NOT DETECTED
Shigella, PCR: NOT DETECTED

## 2016-08-02 ENCOUNTER — Ambulatory Visit (HOSPITAL_BASED_OUTPATIENT_CLINIC_OR_DEPARTMENT_OTHER): Payer: Medicaid Other | Attending: Pulmonary Disease | Admitting: Pulmonary Disease

## 2016-08-02 DIAGNOSIS — G4733 Obstructive sleep apnea (adult) (pediatric): Secondary | ICD-10-CM | POA: Diagnosis not present

## 2016-08-02 DIAGNOSIS — G4710 Hypersomnia, unspecified: Secondary | ICD-10-CM | POA: Diagnosis present

## 2016-08-05 ENCOUNTER — Telehealth: Payer: Self-pay | Admitting: Pulmonary Disease

## 2016-08-05 DIAGNOSIS — G4710 Hypersomnia, unspecified: Secondary | ICD-10-CM | POA: Diagnosis not present

## 2016-08-05 NOTE — Telephone Encounter (Signed)
    Please call the pt and tell the pt the LAB SLEEP STUDY did not show significant OSA. It only showed very mild OSA. She stops breathing 2-3 x/hr which is normal and we do not need to treat.   I am not sure if she needs follow up with me.  She is OK for bariatric surgery.  Pls ask her if I need to fill out paperwork for her pre-op for bariatric surgery.   Thanks!   J. Alexis Frock, MD 08/05/2016, 2:58 PM  ,

## 2016-08-05 NOTE — Procedures (Signed)
    NAME: Jillian Reed DATE OF BIRTH:  Dec 26, 1980 MEDICAL RECORD NUMBER [id number]  LOCATION: [facility]  PHYSICIAN: Daneen Schick Dios  DATE OF STUDY: 08/02/2016   CLINICAL INFORMATION  Sleep Study Type: NPSG  Indication for sleep study: Excessive Daytime Sleepiness, Fatigue, Hypertension, Obesity, Witnessed Apneas  Epworth Sleepiness Score: 5   SLEEP STUDY TECHNIQUE  As per the AASM Manual for the Scoring of Sleep and Associated Events v2.3 (April 2016) with a hypopnea requiring 4% desaturations.  The channels recorded and monitored were frontal, central and occipital EEG, electrooculogram (EOG), submentalis EMG (chin), nasal and oral airflow, thoracic and abdominal wall motion, anterior tibialis EMG, snore microphone, electrocardiogram, and pulse oximetry.   MEDICATIONS  Medications self-administered by patient taken the night of the study : RANITIDINE, MELATONIN Meds reviewed per chart review.  SLEEP ARCHITECTURE  The study was initiated at 10:43:33 PM and ended at 5:04:28 AM.  Sleep onset time was 24.2 minutes and the sleep efficiency was 83.7%. The total sleep time was 319.0 minutes.  Stage REM latency was 98.5 minutes.  The patient spent 10.50% of the night in stage N1 sleep, 77.43% in stage N2 sleep, 0.00% in stage N3 and 12.07% in REM.  Alpha intrusion was absent.  Supine sleep was 31.35%.   RESPIRATORY PARAMETERS  The overall apnea/hypopnea index (AHI) was 2.6 per hour. There were 0 total apneas, including 0 obstructive, 0 central and 0 mixed apneas. There were 14 hypopneas and 13 RERAs.  The AHI during Stage REM sleep was 21.8 per hour. AHI while supine was 8.4 per hour.  The mean oxygen saturation was 97.18%. The minimum SpO2 during sleep was 88.00%.  Soft snoring was noted during this study.  CARDIAC DATA  The 2 lead EKG demonstrated sinus rhythm. The mean heart rate was 79.14 beats per minute. Other EKG findings include: None.   LEG  MOVEMENT DATA  The total PLMS were 0 with a resulting PLMS index of 0.00. Associated arousal with leg movement index was 0.0 .  IMPRESSIONS  1. No significant obstructive sleep apnea occurred during this study (AHI = 2.6/h). This was an adequate study. Patient had very mild OSA, worse during REM sleep. 2. No significant central sleep apnea occurred during this study (CAI = 0.0/h). 3. Moderate oxygen desaturation was noted during this study (Min O2 = 88.00%). 4. The patient snored with Soft snoring volume. 5. No cardiac abnormalities were noted during this study. 6. Clinically significant periodic limb movements did not occur during sleep. No significant associated arousals.  DIAGNOSIS  No evidence for significant OSA based on this study. Patient with very mild OSA, worse in REM sleep.   RECOMMENDATIONS  1. Weight reduction advised. Patient is scheduled to have bariatric surgery. 2. Avoid alcohol, sedatives and other CNS depressants that may worsen sleep apnea and disrupt normal sleep architecture. 3. Sleep hygiene should be reviewed to assess factors that may improve sleep quality. 4. Weight management and regular exercise should be initiated or continued if appropriate. 5. Follow up in the office as scheduled.   Pollie Meyer, MD 08/05/2016, 2:52 PM [facility] Pager [phone] After 3 pm or if no answer, call [phone]

## 2016-08-06 NOTE — Telephone Encounter (Signed)
lmomtcb x1 

## 2016-08-06 NOTE — Telephone Encounter (Signed)
Patient is returning phone call.  °

## 2016-08-10 NOTE — Telephone Encounter (Signed)
lmomtcb x 2  

## 2016-08-11 ENCOUNTER — Ambulatory Visit: Payer: Medicaid Other | Admitting: Gastroenterology

## 2016-08-12 NOTE — Telephone Encounter (Signed)
Spoke with pt and made her aware of her results per AD. Pt has recently moved to [facility], for a new job opportunity and has to establish new providers down there. She will follow back up with [place] if she needs anything. Nothing further is needed

## 2016-08-19 ENCOUNTER — Other Ambulatory Visit: Payer: Self-pay | Admitting: Family Medicine

## 2016-08-19 DIAGNOSIS — F329 Major depressive disorder, single episode, unspecified: Secondary | ICD-10-CM

## 2016-08-19 DIAGNOSIS — F32A Depression, unspecified: Secondary | ICD-10-CM

## 2016-09-07 ENCOUNTER — Encounter: Payer: Self-pay | Admitting: Family Medicine

## 2016-09-07 ENCOUNTER — Ambulatory Visit (INDEPENDENT_AMBULATORY_CARE_PROVIDER_SITE_OTHER): Payer: Medicaid Other | Admitting: Family Medicine

## 2016-09-07 VITALS — BP 124/82 | HR 69 | Temp 98.1°F | Ht 67.0 in | Wt >= 6400 oz

## 2016-09-07 DIAGNOSIS — Z8659 Personal history of other mental and behavioral disorders: Secondary | ICD-10-CM

## 2016-09-07 DIAGNOSIS — F329 Major depressive disorder, single episode, unspecified: Secondary | ICD-10-CM | POA: Diagnosis present

## 2016-09-07 DIAGNOSIS — F32A Depression, unspecified: Secondary | ICD-10-CM

## 2016-09-07 MED ORDER — GABAPENTIN 300 MG PO CAPS: 300.0000 mg | ORAL_CAPSULE | Freq: Three times a day (TID) | ORAL | 0 refills | Status: DC

## 2016-09-07 MED ORDER — SERTRALINE HCL 100 MG PO TABS: 100.0000 mg | ORAL_TABLET | Freq: Every day | ORAL | 1 refills | Status: DC

## 2016-09-07 NOTE — Progress Notes (Signed)
Dr. Caroleen Hamman requested a Behavioral Health Consult.   Presenting Issue:  Panic, Stress  Report of symptoms:  Patient experiencing panic attacks nearly every day, particularly at night when she "can't shut her mind off." During panic attacks, she feels like its hard to breath, her heart pounds, and she cries. She states that she "worries about everything." Patient is under particular stress currently because she moved to [place] and her children are still living in [place] until the summer when they will move to join her.   Assessment / Plan / Recommendations: We discussed several coping strategies today, as patient is going back to [place] and therefore can't schedule regular followup appointments. We discussed how she is managing her stress in general. Patient states she is eating well and is fairly active at work, though isn't taking a lot of time to do things she enjoys- we discussed building more of this into her schedule. I also discussed deep breathing relaxation. Patient had not tried this before though is open to it. We practiced during her appointment today and she states that she can try to do this several times per day. I also gave her a list of apps that help encourage coping strategies for anxiety, including guiding her through relaxation strategies. I asked permission to follow up with her over the phone, and patient agreed.   Warmhandoff:    Warm Hand Off Completed.

## 2016-09-07 NOTE — Patient Instructions (Signed)
Thank you so much for coming to visit today! I have increased your Zoloft to 100mg  daily. You may take two of the 50mg  tablets daily until you run out. Please try the coping mechanisms discussed with Behavioral Health. I would recommend trying to establish with a therapist in [place]. You may also talk to our behavioral health specialists by phone if needed. Please return in 4 weeks to see how you are doing. We may consider increasing your Zoloft further at that visit.  Dr. Caroleen Hamman

## 2016-09-08 NOTE — Progress Notes (Signed)
Subjective:     Patient ID: Jillian Reed, female   DOB: 1981/06/05, 36 y.o.   MRN: [id number]  HPI Mrs. Tarabocchia is a 36yo female presenting today to discuss subjectively worsening depression, anxiety, and panic attacks. Has been taking Zoloft 50mg  daily as prescribed. States she felt like it worked great initially, but now does not seem to be working as well. States she has been feeling more anxious and has been having more panic attacks, now occurring 1-3 times daily and lasting 15-74minutes. Experiences diaphoresis, shortness of breath, feeling of pending doom during episodes. Usually feels better after talking to her mother or roommate. She isn't sure what triggers episodes. Does not have therapist. Complicating matters, Mrs. Riccardi recently accepted a job at [employer] in [place] and now lives down there. Her and her children have [place] Medicaid and was told she can't change that until June, so she returns periodically for her medical care here. She arrived today by train and will leave on 3/22 to return to [place]. Planning on returning to [facility] for a few days mid-April.   Review of Systems Per HPI    Objective:   Physical Exam  Constitutional: She appears well-developed and well-nourished. No distress.  Cardiovascular: Normal rate and regular rhythm.   No murmur heard. Pulmonary/Chest: Effort normal. No respiratory distress. She has no wheezes.  Psychiatric: She has a normal mood and affect. Her behavior is normal.      Assessment and Plan:     1. Depression/Anxiety/Panic Attacks PHQ9 Score improved from 20 in 03/2016 to 8 today (trouble sleeping nearly every day, overeating 50%, occasional feelings of depression/little energy/trouble concentrating. Denies SI/HI.) GAD7 Score improved from 19 in 03/2016 to 17 today (Experiences feelings of anxiety, worrying, and feeling afraid something bad may happen nearly every day. Trouble relaxing and irritable 50%. Occasional restlessness.) Zoloft dose  increased to 100mg  daily. BH to see and discuss coping strategies to aid in Panic Attacks. BH will plan to communicate with Mrs. Puertas by phone since she will be unable to follow up in person for 61month. To return in one month at which time we will repeat PHQ9 and GAD and consider titrating Zoloft up further.

## 2016-11-03 ENCOUNTER — Other Ambulatory Visit: Payer: Self-pay | Admitting: Family Medicine

## 2016-12-31 ENCOUNTER — Encounter: Payer: Self-pay | Admitting: Internal Medicine

## 2016-12-31 ENCOUNTER — Ambulatory Visit (INDEPENDENT_AMBULATORY_CARE_PROVIDER_SITE_OTHER): Payer: Medicaid Other | Admitting: Internal Medicine

## 2016-12-31 VITALS — BP 100/80 | HR 87 | Temp 98.6°F | Ht 66.0 in | Wt 399.0 lb

## 2016-12-31 DIAGNOSIS — M79604 Pain in right leg: Secondary | ICD-10-CM

## 2016-12-31 DIAGNOSIS — M79605 Pain in left leg: Secondary | ICD-10-CM

## 2016-12-31 MED ORDER — BACLOFEN 10 MG PO TABS: 10.0000 mg | ORAL_TABLET | Freq: Three times a day (TID) | ORAL | 0 refills | Status: DC

## 2016-12-31 NOTE — Patient Instructions (Addendum)
Please follow up with Podiatry. Can use baclofen as need for muscular pain in legs. Will check some blood levels. Please follow up in 2-3 weeks if no improvement

## 2016-12-31 NOTE — Progress Notes (Signed)
   [facility] Noralee Chars, MD Phone: [phone]  Reason For Visit: SDA bilateral feet and leg pain   # Pain in her legs/foot for 2 months, muscular foot spasm  - Has already tried gabapentin which puts her to sleep but does not help with the pain  - No swelling in legs  - Pain is always present- it is a constant pain  - Patient has previously had steroid injections in [place] where she got steroid injections  - No worse or better with walking  - Has taken Naproxen for pain control    Past Medical History Reviewed problem list.   Medications- reviewed and updated No additions to family history Social history- patient is a non-smoker  Objective: BP 100/80 (BP Location: Left Wrist, Patient Position: Sitting, Cuff Size: Normal)   Pulse 87   Temp 98.6 F (37 C) (Oral)   Ht 5\' 6"  (1.676 m)   Wt (!) 399 lb (181 kg)   LMP 12/11/2016   SpO2 98%   BMI 64.40 kg/m  Gen: NAD, alert, cooperative with exam Extremities: warm, well perfused, No edema, cyanosis or clubbing;  MSK: Normal gait and station, no edema, tenderness along feet and lower extremity bilaterally, no erythema, 5/5 strength in lower extremities bilaterally, 2+ bilateral reflexes, noted to have varicose veins bilaterally   Assessment/Plan: See problem based a/p  Leg pain No concern for DVT, pain possibly due to varicose veins, significantly obese which could be contributing to generalized leg and bilateral foot pain  - baclofen (LIORESAL) 10 MG tablet; Take 1 tablet (10 mg total) by mouth 3 (three) times daily.  Dispense: 30 each; Refill: 0 - Ambulatory referral to Podiatry - Basic metabolic panel; Future - CBC; Future - CK; Future

## 2017-01-04 NOTE — Assessment & Plan Note (Signed)
No concern for DVT, pain possibly due to varicose veins, significantly obese which could be contributing to generalized leg and bilateral foot pain  - baclofen (LIORESAL) 10 MG tablet; Take 1 tablet (10 mg total) by mouth 3 (three) times daily.  Dispense: 30 each; Refill: 0 - Ambulatory referral to Podiatry - Basic metabolic panel; Future - CBC; Future - CK; Future

## 2017-01-17 ENCOUNTER — Telehealth: Payer: Self-pay | Admitting: Gastroenterology

## 2017-01-17 NOTE — Telephone Encounter (Signed)
ROI Faxed to [facility] [place] [facility] [phone] mc

## 2017-01-21 ENCOUNTER — Ambulatory Visit (INDEPENDENT_AMBULATORY_CARE_PROVIDER_SITE_OTHER): Payer: Medicaid Other | Admitting: Podiatry

## 2017-01-21 ENCOUNTER — Ambulatory Visit (INDEPENDENT_AMBULATORY_CARE_PROVIDER_SITE_OTHER): Payer: Medicaid Other

## 2017-01-21 ENCOUNTER — Encounter: Payer: Self-pay | Admitting: Podiatry

## 2017-01-21 DIAGNOSIS — M79672 Pain in left foot: Secondary | ICD-10-CM

## 2017-01-21 DIAGNOSIS — M79671 Pain in right foot: Secondary | ICD-10-CM

## 2017-01-21 DIAGNOSIS — M792 Neuralgia and neuritis, unspecified: Secondary | ICD-10-CM

## 2017-01-21 NOTE — Progress Notes (Signed)
Subjective:    Patient ID: Jillian Reed, female   DOB: 36 y.o.   MRN: [id number]   HPI patient presents stating she's had some burning in both her feet for a couple months and is taking gabapentin currently more for restless legs symptoms with patient being extremely obese    Review of Systems  All other systems reviewed and are negative.       Objective:  Physical Exam  Cardiovascular: Intact distal pulses.   Musculoskeletal: Normal range of motion.  Neurological: She is alert.  Skin: Skin is dry.  Nursing note and vitals reviewed.  neurovascular status found to be intact muscle strength was mildly reduced with diminished sharp [employer] vibratory. Patient has extreme obesity which is a part of the pathology of her feet and currently is on low-dose gabapentin     Assessment:    Probability for neuropathic like pain but it is fairly recent in occurrence are difficult to make complete determination     Plan:    H&P condition reviewed and at this point I recommended increasing gabapentin by family physician we will start her on cream to try to reduce any nerve stimulation and if symptoms were to persist she will need to see a neurologist

## 2017-01-24 ENCOUNTER — Telehealth: Payer: Self-pay | Admitting: Podiatry

## 2017-01-24 NOTE — Telephone Encounter (Signed)
I had an appointment and ended up seeing Dr. Samuella Cota who was working with Dr. Charlsie Merles. He told me he was going to call me in a medicated cream for my foot and that my insurance would cover it. They pharmacy stated it wouldn't and it would be $38.00. If someone could please call me back.

## 2017-01-25 NOTE — Telephone Encounter (Signed)
Left message informing pt that if she was not able to purchase the [employer] cream, Dr. Charlsie Merles recommended Capsicin cream or Aspercreme.

## 2017-02-22 ENCOUNTER — Ambulatory Visit: Payer: Medicaid Other | Admitting: Student

## 2017-02-23 ENCOUNTER — Other Ambulatory Visit (HOSPITAL_COMMUNITY)
Admission: RE | Admit: 2017-02-23 | Discharge: 2017-02-23 | Disposition: A | Discharge status: Home / Self Care | Payer: Medicaid Other | Source: Ambulatory Visit | Attending: Family Medicine | Admitting: Family Medicine

## 2017-02-23 ENCOUNTER — Ambulatory Visit (INDEPENDENT_AMBULATORY_CARE_PROVIDER_SITE_OTHER): Payer: Medicaid Other | Admitting: Internal Medicine

## 2017-02-23 DIAGNOSIS — Z0000 Encounter for general adult medical examination without abnormal findings: Secondary | ICD-10-CM

## 2017-02-23 DIAGNOSIS — M79671 Pain in right foot: Secondary | ICD-10-CM

## 2017-02-23 DIAGNOSIS — Z23 Encounter for immunization: Secondary | ICD-10-CM

## 2017-02-23 DIAGNOSIS — M79672 Pain in left foot: Secondary | ICD-10-CM | POA: Diagnosis not present

## 2017-02-23 LAB — POCT URINE PREGNANCY: PREG TEST UR: NEGATIVE

## 2017-02-23 NOTE — Patient Instructions (Addendum)
I will let you know results of your pap smear. I recommend you do month breast exams. I will send a referral to sports medicine for a second opinion regarding your bilateral feet pain     Breast Self-Awareness Breast self-awareness means:  Knowing how your breasts look.  Knowing how your breasts feel.  Checking your breasts every month for changes.  Telling your doctor if you notice a change in your breasts.  Breast self-awareness allows you to notice a breast problem early while it is still small. How to do a breast self-exam One way to learn what is normal for your breasts and to check for changes is to do a breast self-exam. To do a breast self-exam: Look for Changes  1. Take off all the clothes above your waist. 2. Stand in front of a mirror in a room with good lighting. 3. Put your hands on your hips. 4. Push your hands down. 5. Look at your breasts and nipples in the mirror to see if one breast or nipple looks different than the other. Check to see if: ? The shape of one breast is different. ? The size of one breast is different. ? There are wrinkles, dips, and bumps in one breast and not the other. 6. Look at each breast for changes in your skin, such as: ? Redness. ? Scaly areas. 7. Look for changes in your nipples, such as: ? Liquid around the nipples. ? Bleeding. ? Dimpling. ? Redness. ? A change in where the nipples are. Feel for Changes 1. Lie on your back on the floor. 2. Feel each breast. To do this, follow these steps: ? Pick a breast to feel. ? Put the arm closest to that breast above your head. ? Use your other arm to feel the nipple area of your breast. Feel the area with the pads of your three middle fingers by making small circles with your fingers. For the first circle, press lightly. For the second circle, press harder. For the third circle, press even harder. ? Keep making circles with your fingers at the light, harder, and even harder pressures as  you move down your breast. Stop when you feel your ribs. ? Move your fingers a little toward the center of your body. ? Start making circles with your fingers again, this time going up until you reach your collarbone. ? Keep making up and down circles until you reach your armpit. Remember to keep using the three pressures. ? Feel the other breast in the same way. 3. Sit or stand in the shower or tub. 4. With soapy water on your skin, feel each breast the same way you did in step 2, when you were lying on the floor. Write Down What You Find  After doing the self-exam, write down:  What is normal for each breast.  Any changes you find in each breast.  When you last had your period.  How often should I check my breasts? Check your breasts every month. If you are breastfeeding, the best time to check them is after you feed your baby or after you use a breast pump. If you get periods, the best time to check your breasts is 5-7 days after your period is over. When should I see my doctor? See your doctor if you notice:  A change in shape or size of your breasts or nipples.  A change in the skin of your breast or nipples, such as red or scaly skin.  Unusual fluid coming from your nipples.  A lump or thick area that was not there before.  Pain in your breasts.  Anything that concerns you.  This information is not intended to replace advice given to you by your health care provider. Make sure you discuss any questions you have with your health care provider. Document Released: 11/24/2007 Document Revised: 11/13/2015 Document Reviewed: 04/27/2015 Elsevier Interactive Patient Education  [employer].

## 2017-02-23 NOTE — Progress Notes (Signed)
   [facility] Noralee Chars, MD Phone: [phone]  Reason For Visit:  Pap Smear   # Women's Health  - Pap smear have been abnormal in the past, needed an endometrial biopsy in the past for abnormal endometrial cells  - Last pap smear in 2015 was normal - Sexual activity  - long term relationship, no concern for STDs - No vaginal dishcarge, no irritation  - Does not do breast exams, no family hx of breast cancer - No birth control, does not use condoms - states she was told by multiple specialist after having an abortion that she will never be able to have a child. Her son about 10 years ago was miracle baby and she has never really used birth control except for brief stint with IUD of about 2 months.She is not interested in any birth control  - Has not had a period in August 10th - has always been irregular - seen multiple specialist for this issues   Bilateral foot pain  - Seen by me for bilateral foot pain  - Likely with plantar fascitis  - Was seen by an podiatrist, one podiatrist recommended a orthotics the other did not  - Patient would like a second opinion given the incongruens between the two   Past Medical History Reviewed problem list.  Medications- reviewed and updated No additions to family history Social history- patient is a non- smoker  Objective: There were no vitals taken for this visit. Gen: NAD, alert, cooperative with exam Cardio: regular rate and rhythm, S1S2 heard, no murmurs appreciated Pulm: clear to auscultation bilaterally, no wheezes, rhonchi or rales GI: soft, non-tender, non-distended, bowel sounds present, no hepatomegaly, no splenomegaly GU: external vaginal tissue wnl, cervix wnl, no punctate lesions on cervix appreciated, no discharge from cervical os, no bleeding, no cervical motion tenderness, no abdominal/ adnexal masses Skin: dry, intact, no rashes or lesions    Assessment/Plan: See problem based a/p  Bilateral  foot pain Likely plantar fascitis, no improvement with NSAIDs or steriod injections previously - Will send in a referral to Dr. Darrick Penna to determine if orthotics would help at all in this patient  Annual physical exam Counseled on monthly breast exams  Obtained pap smear  Not interested in contraception - POCT urine pregnancy - Cytology - PAP Follow up with PCP as needed

## 2017-02-23 NOTE — Progress Notes (Signed)
U

## 2017-02-25 ENCOUNTER — Other Ambulatory Visit: Payer: Self-pay | Admitting: Internal Medicine

## 2017-02-25 DIAGNOSIS — M79605 Pain in left leg: Principal | ICD-10-CM

## 2017-02-25 DIAGNOSIS — M79604 Pain in right leg: Secondary | ICD-10-CM

## 2017-02-27 NOTE — Assessment & Plan Note (Addendum)
Counseled on monthly breast exams  Obtained pap smear  Not interested in contraception - POCT urine pregnancy - Cytology - PAP Follow up with PCP as needed

## 2017-02-27 NOTE — Assessment & Plan Note (Signed)
Likely plantar fascitis, no improvement with NSAIDs or steriod injections previously - Will send in a referral to Dr. Darrick Penna to determine if orthotics would help at all in this patient

## 2017-02-28 LAB — CYTOLOGY - PAP
Diagnosis: NEGATIVE
HPV: NOT DETECTED

## 2017-03-01 ENCOUNTER — Telehealth: Payer: Self-pay | Admitting: Internal Medicine

## 2017-03-01 MED ORDER — METRONIDAZOLE 500 MG PO TABS: 2000.0000 mg | ORAL_TABLET | Freq: Once | ORAL | 0 refills | Status: AC

## 2017-03-01 NOTE — Telephone Encounter (Signed)
Called patient to let them know they had Trichomonas on their Pap smear. They need to let partner know so that they can be treated. Prescribed flagyl

## 2017-03-04 ENCOUNTER — Ambulatory Visit: Payer: Medicaid Other | Admitting: Podiatry

## 2017-03-08 ENCOUNTER — Encounter: Payer: Self-pay | Admitting: Sports Medicine

## 2017-03-08 ENCOUNTER — Ambulatory Visit (INDEPENDENT_AMBULATORY_CARE_PROVIDER_SITE_OTHER): Payer: Medicaid Other | Admitting: Sports Medicine

## 2017-03-08 DIAGNOSIS — M79671 Pain in right foot: Secondary | ICD-10-CM | POA: Diagnosis present

## 2017-03-08 DIAGNOSIS — M79672 Pain in left foot: Secondary | ICD-10-CM

## 2017-03-08 NOTE — Progress Notes (Signed)
   [facility] [address] [place], [place] [address] Phone: [phone] Fax: [phone]   Patient Name: Jillian Reed Date of Birth: December 01, 1980 Medical Record Number: [id number] Gender: female Date of Encounter: 03/08/2017  History of Present Illness:  Jillian Reed is a 36 y.o. very pleasant female patient who presents with the following:  Bilateral pain in her feet for the past 9 months as well as numbness and tingling on the soles of her feet. Reports that she has previously seen podiatry who diagnosed with plantar fasciitis and has had several injections, none of which have improved her symptoms. 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  Usually wears new balance. She denies any weakness, swelling or history of injuries. Reports that most of her pain is in the medial arches and less so and heel.   Past Medical, Surgical, Social, and Family History Reviewed. Medications and Allergies reviewed and all updated if necessary.  Review of Systems:  Denies weakness, swelling or feelings of instability  Physical Examination: Vitals:   03/08/17 0934  BP: 131/70   Vitals:   03/08/17 0934  Weight: (!) 398 lb (180.5 kg)  Height: 5\' 7"  (1.702 m)   Body mass index is 62.34 kg/m.  General: well appearing 36 year old female in NAD HEENT: AT/[place], EOMI Cardiac: well perfused Resp: NWOB, breathing comfortably on room air and no accessory muscle use Extremities: No cyanosis, clubbing or edema Vasc: 2+ palpable pulses in the radial arteries, dorsalis pedis and tibials anterior MSK:   Ankles: No gross deformities, ecchymoses or edema. Full AROM/PROM, no tenderness at the ATFL, CFL or PTFL or deltoid ligaments bilaterally. No bony tenderness. Does have tenderness at the forefoot of the first digit laterally with significant callous, as well as along the medial transverse arch is worse at the distal one third and also has significant  callus around that region bilaterally. While sitting appears to have varus deformity but arches do collapse when standing L>R.  Neuro: grossly normal, no changes in sensation, strength 5/5 lower extremities  Assessment and Plan: Bilateral foot pain Ongoing for 9 months with pain, numbness and tingling most prominent between the medial and lateral arches bilaterally. Also with significant callus along forefoot, most prominent at the first digit as well at the proximal portion of the medial transverse arch, just distal to the calcaneous bilaterally. Patient does have high arches while sitting, but they do collapse L>R. Patient most likely has transverse arch strain bilaterally. Additionally, she has a BMI of 62 and her weight is contributing significantly to her symptoms. Provided patient with green insoles and scaphoid pads to help with arch support. As for her numbness and tingling, she needs to follow-up with her primary doctor to have her hemoglobin A1c checked to rule out diabetic neuropathy.   Jillian Reed L. Myrtie Soman, MD [facility] Resident PGY-2 03/08/2017 4:19 PM   Patient seen and evaluated with the resident. I agree with the above plan of care. Were going to try some green sports insoles and scaphoid pads. If these are helpful then we could consider custom orthotics at follow-up. I recommend that she follow-up with her primary care physician for neuropathic foot pain and follow-up with me in 4 weeks. Her morbid obesity is also playing a role in her chronic foot pain. She understands this.

## 2017-03-08 NOTE — Assessment & Plan Note (Addendum)
Ongoing for 9 months with pain, numbness and tingling most prominent between the medial and lateral arches bilaterally. Also with significant callus along forefoot, most prominent at the first digit as well at the proximal portion of the medial transverse arch, just distal to the calcaneous bilaterally. Patient does have high arches while sitting, but they do collapse L>R. Patient most likely has transverse arch strain bilaterally. Additionally, she has a BMI of 62 and her weight is contributing significantly to her symptoms. Provided patient with green insoles and scaphoid pads to help with arch support. As for her numbness and tingling, she needs to follow-up with her primary doctor to have her hemoglobin A1c checked to rule out diabetic neuropathy.

## 2017-03-14 ENCOUNTER — Ambulatory Visit: Payer: Medicaid Other | Admitting: Student

## 2017-03-15 ENCOUNTER — Ambulatory Visit: Payer: Medicaid Other | Admitting: Student

## 2017-03-20 IMAGING — CT CT ANGIO CHEST
5 of 13 series · 18 of 46 positions shown · IV contrast (omnipaque)
Comparison: Chest radiographs same day.

CLINICAL DATA: Shortness of breath.  Acute chest pain.

EXAM:
CT ANGIOGRAPHY CHEST WITH CONTRAST
TECHNIQUE: Multidetector CT imaging of the chest was performed using the
standard protocol during bolus administration of intravenous
contrast. Multiplanar CT image reconstructions and MIPs were
obtained to evaluate the vascular anatomy.
CONTRAST:  100mL OMNIPAQUE IOHEXOL 350 MG/ML SOLN

[Series 5: pe 3.0 i30f 3 · axial · 0.80mm/px · z∈[-189,-45]mm · 3 of 97 slices shown (1 of 2)]
[im 25/97  lung]
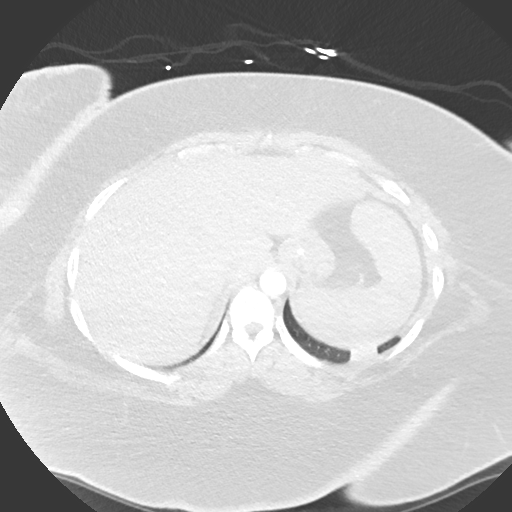
[im 49/97  soft-tissue]
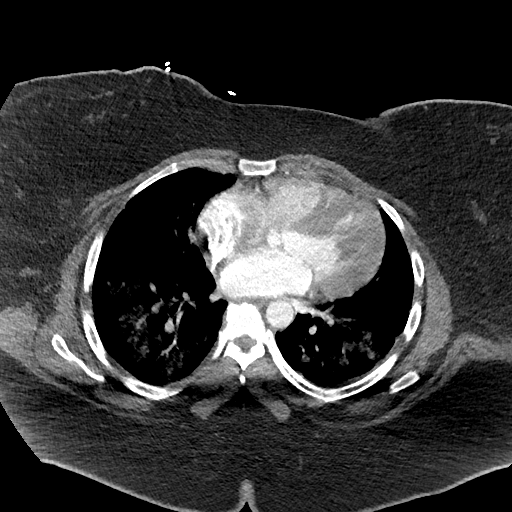
[im 73/97  lung]
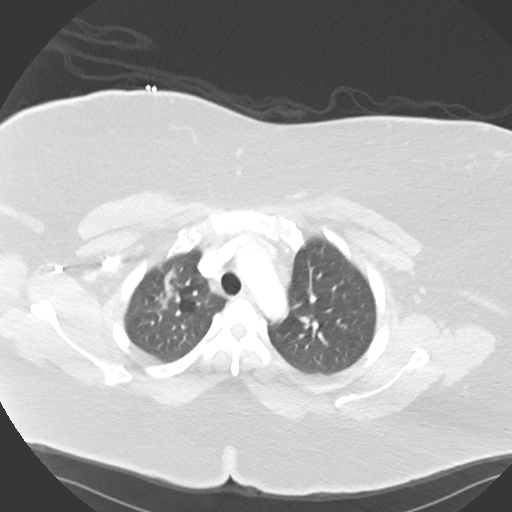

[Series 8: pe 3.0 i30f 3 · axial · 0.76mm/px · z∈[-147,-63]mm · 2 of 84 slices shown (2 of 2)]
[im 28/84  lung]
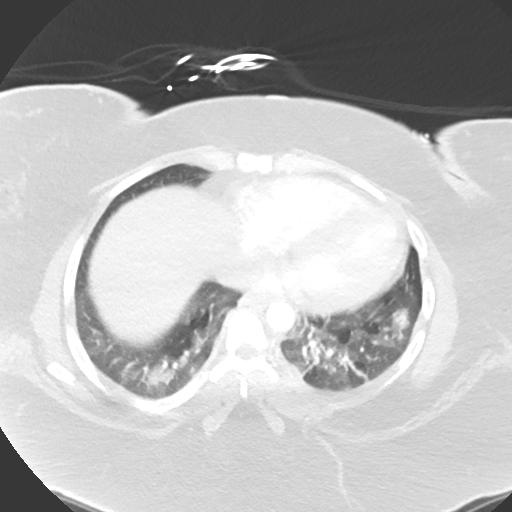
[im 56/84  lung]
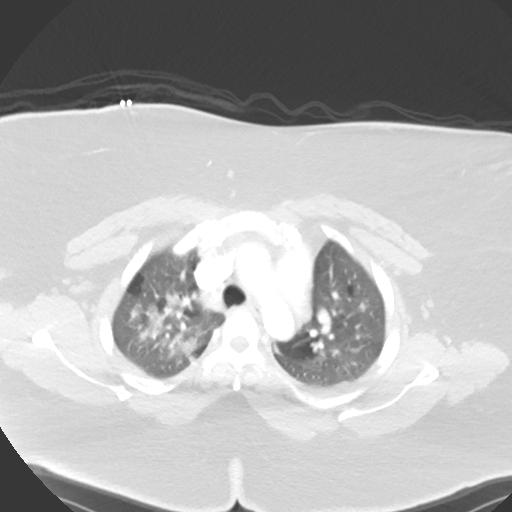

[Series 9: thins · axial · 0.76mm/px · z∈[-210,+1]mm · 8 of 251 slices shown]
[im 20/251  lung]
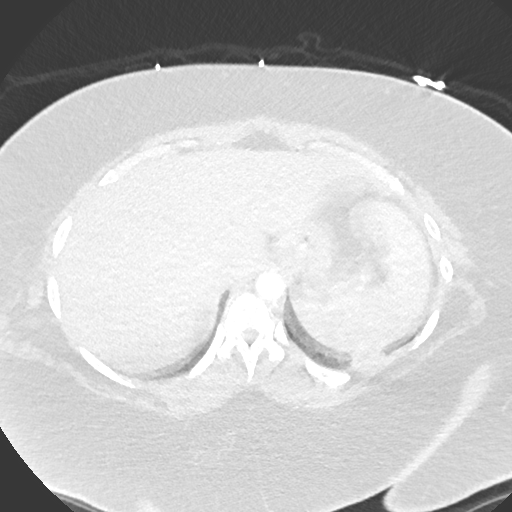
[im 58/251  lung]
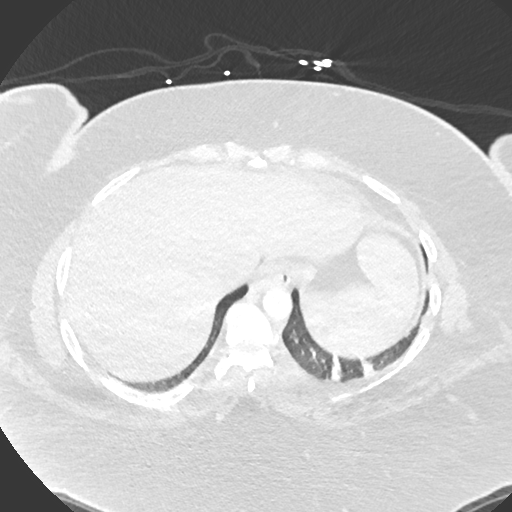
[im 77/251  lung]
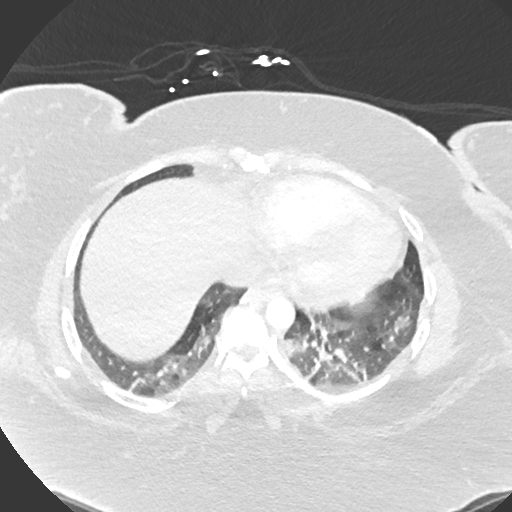
[im 116/251  lung]
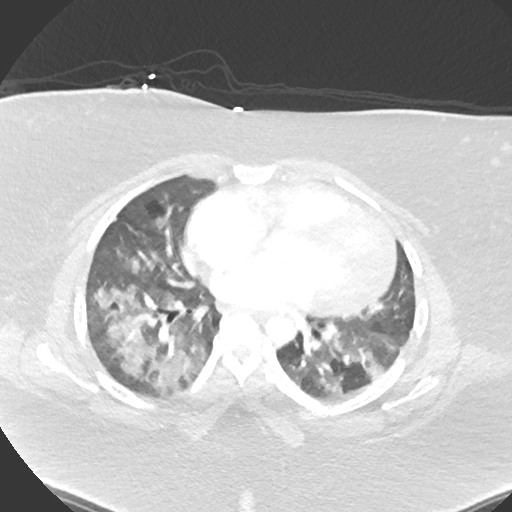
[im 135/251  lung]
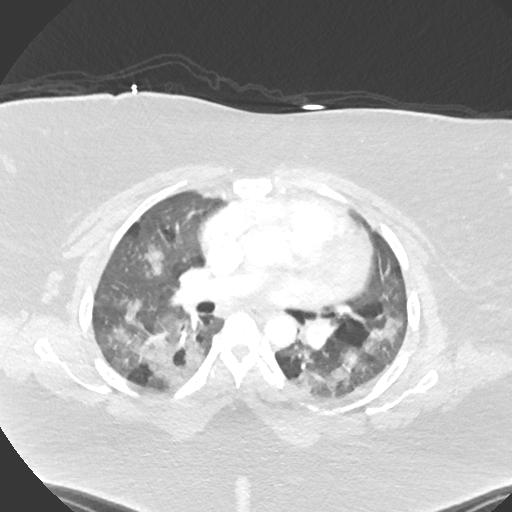
[im 174/251  lung]
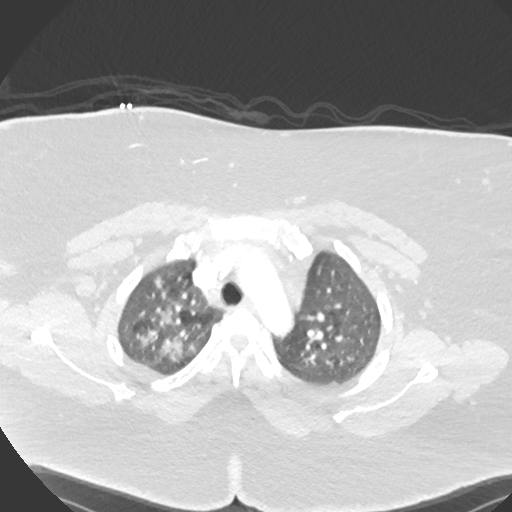
[im 193/251  lung]
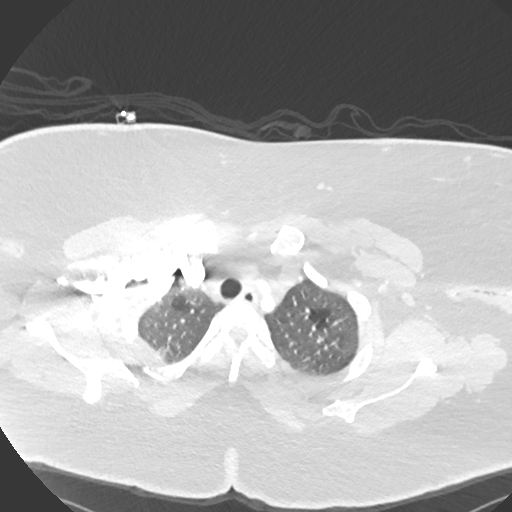
[im 231/251  lung]
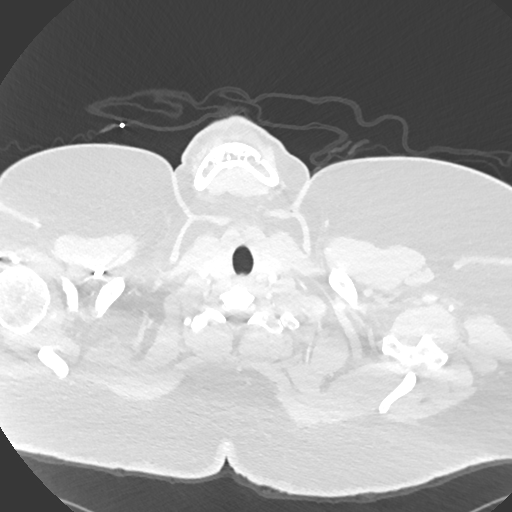

[Series 10: lung · axial · 0.76mm/px · z∈[-147,-60]mm · 2 of 87 slices shown]
[im 29/87  soft-tissue]
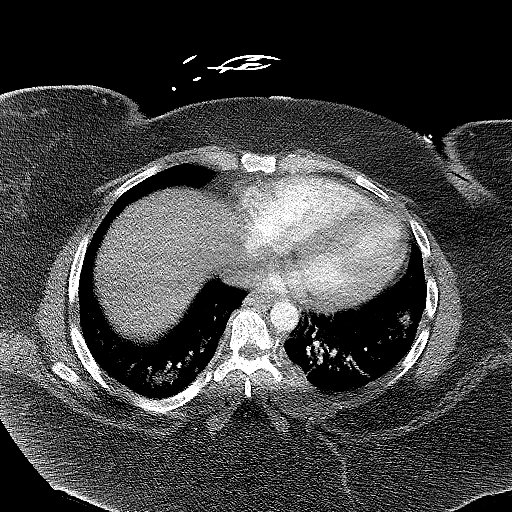
[im 58/87  soft-tissue]
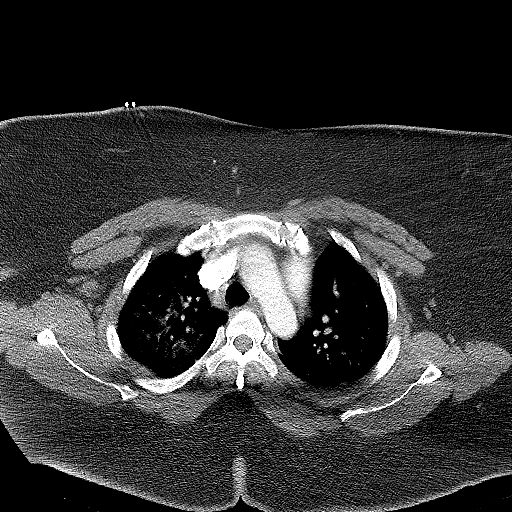

[Series 11: coronal mpr · coronal · 0.53mm/px · 3 of 151 slices shown]
[im 38/151  soft-tissue]
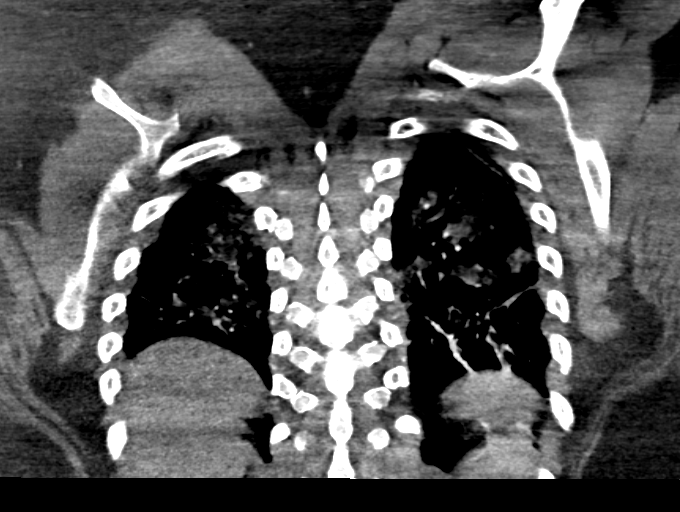
[im 76/151  soft-tissue]
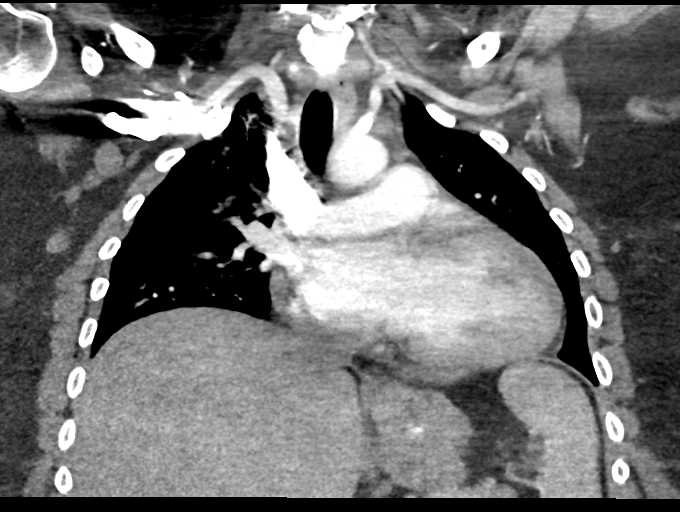
[im 113/151  soft-tissue]
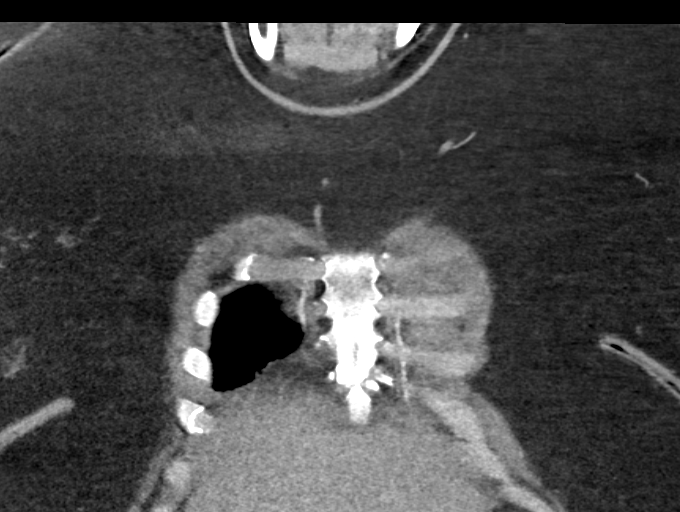

[18 of 46 positions shown; findings below may reference images not displayed]

FINDINGS: No pneumothorax or pleural effusion is noted. Patchy airspace
opacities are noted throughout both lungs most consistent with
multifocal pneumonia or less likely edema. There is no evidence of
thoracic aortic dissection or aneurysm. Visualized portion of upper
abdomen appears normal. There is no evidence of large central
pulmonary embolus. However, due to body habitus and limited contrast
opacification of the peripheral vessels, smaller peripheral
pulmonary emboli cannot be excluded on the basis of this exam. No
mediastinal mass or adenopathy is noted.

Review of the MIP images confirms the above findings.
IMPRESSION: No large central pulmonary embolus is noted. Due to limited
opacification of the peripheral vessels as well as body habitus,
smaller peripheral emboli cannot be excluded on the basis of this
exam.

Multiple patchy opacities are noted throughout both lungs most
consistent with multifocal pneumonia or possibly edema.

## 2017-03-31 ENCOUNTER — Ambulatory Visit (INDEPENDENT_AMBULATORY_CARE_PROVIDER_SITE_OTHER): Payer: Medicaid Other | Admitting: Student

## 2017-03-31 ENCOUNTER — Encounter: Payer: Self-pay | Admitting: Student

## 2017-03-31 DIAGNOSIS — F32A Depression, unspecified: Secondary | ICD-10-CM

## 2017-03-31 DIAGNOSIS — M79604 Pain in right leg: Secondary | ICD-10-CM

## 2017-03-31 DIAGNOSIS — M79605 Pain in left leg: Secondary | ICD-10-CM | POA: Diagnosis not present

## 2017-03-31 DIAGNOSIS — F329 Major depressive disorder, single episode, unspecified: Secondary | ICD-10-CM | POA: Diagnosis not present

## 2017-03-31 MED ORDER — DULOXETINE HCL 30 MG PO CPEP: 30.0000 mg | ORAL_CAPSULE | Freq: Every day | ORAL | 0 refills | Status: DC

## 2017-03-31 MED ORDER — GABAPENTIN 300 MG PO CAPS: ORAL_CAPSULE | ORAL | 1 refills | Status: DC

## 2017-03-31 NOTE — Patient Instructions (Signed)
It was great seeing you today! We have addressed the following issues today 1. Depression: we have started her new medication called Cymbalta. Please take this medication as prescribed. Follow up in 2 weeks. 2   Weight issue: we have ordered a referral to bariatric surgery. Someone will get in touch with you about this in the next 2 weeks. 3.  Leg pain: Cymbalta could help with this. We have also increased his gabapentin  If we did any lab work today, and the results require attention, either me or my nurse will get in touch with you. If everything is normal, you will get a letter in mail and a message via . If you don't hear from [place] in two weeks, please give [place] a call. Otherwise, we look forward to seeing you again at your next visit. If you have any questions or concerns before then, please call the clinic at [phone].  Please bring all your medications to every doctors visit  Sign up for My Chart to have easy access to your labs results, and communication with your Primary care physician.    Please check-out at the front desk before leaving the clinic.    Take Care,   Dr. Alanda Slim  Portion Size    Choose healthier foods such as 100% whole grains, vegetables, fruits, beans, nut seeds, olive oil, most vegetable oils, fat-free dietary, wild game and fish.   Avoid sweet tea, other sweetened beverages, soda, fruit juice, cold cereal and milk and trans fat.   Eat at least 3 meals and 1-2 snacks per day.  Aim for no more than 5 hours between eating.  Eat breakfast within one hour of getting up.    Exercise at least 150 minutes per week, including weight resistance exercises 3 or 4 times per week.   Try to lose at least 7-10% of your current body weight.

## 2017-03-31 NOTE — Progress Notes (Signed)
Subjective:    Jillian Reed is a 36 y.o. old female here for referral to bariatric surgery and bilateral feet pain. HPI  Obesity: Body mass index is 64 kg/m. Reports history of stress eating. She also reports skipping major meals. She reports snacking frequently. Reports losing some weight by using lipozene in the past. She does not exercise regularly. She is interested in referral to bariatric surgery.  Bilateral feet pain: This is a chronic issue. She describes the pain as burning in her feet bilaterally. She has no history of diabetes. She is on gabapentin 900 mg at night and 600 mg in the morning and during the daytime. She says gabapentin makes her sleepy. She has history of vitamin D deficiency and was treated about a year ago. She denies fever, urinary retention, saddle anesthesia, unintentional weight loss or pain waking her up at night.   PMH/Problem List: has Annual physical exam; Herpetic whitlow; Morbid obesity (HCC); Seasonal allergies; Acid reflux; Essential hypertension; Anemia; Cramp of both lower extremities; Hiatal hernia; Leg pain; Depression; Hypersomnia; and Bilateral foot pain on her problem list.   has a past medical history of Anemia; Anxiety; Depression; GERD (gastroesophageal reflux disease); Headache; Hypertension; Infection; Obesity; Pregnancy induced hypertension; and Vaginal Pap smear, abnormal.  FH:  Family History  Problem Relation Age of Onset  . Diabetes Mother   . Hypertension Mother   . Arthritis Mother   . Hyperlipidemia Mother   . Diabetes Father   . Hypertension Father   . Depression Father   . Heart disease Maternal Grandmother     SH Social History  Substance Use Topics  . Smoking status: Never Smoker  . Smokeless tobacco: Never Used  . Alcohol use 1.2 oz/week    2 Glasses of wine per week     Comment: occassionally when not pregnant    Review of Systems Review of systems negative except for pertinent positives and negatives in history of  present illness above.     Objective:     Vitals:   03/31/17 1047 03/31/17 1403  BP: 135/80 (!) 142/98  Pulse:  63  Temp:  98.3 F (36.8 C)  TempSrc:  Oral  SpO2:  97%  Weight:  (!) 408 lb 9.6 oz (185.3 kg)  Height:   (1.702 m)   Body mass index is 64 kg/m.  Physical Exam GEN: appears obese, no apparent distress. ENDO: negative thyromegally CVS: RRR, nl S1&S2, no murmurs, no edema RESP: no IWOB, good air movement bilaterally, CTAB GI: Bowel sounds present and normal. No tenderness to palpation. Further exam limited due to body habitus. MSK: no focal tenderness to palpation over her lower extremities and back bilaterally. Straight leg raise negative. SKIN: no apparent skin lesion NEURO: alert and oiented appropriately, motor 5/5 in all muscle groups of lower extremities bilaterally. Light and sharp sensation intact in all dermatomes. Normal proprioception in her lower extremities bilaterally. PSYCH: euthymic mood with congruent affect Depression screen [facility] 2/9 03/31/2017 03/31/2017 01/03/2017 09/08/2016 09/07/2016  Decreased Interest 1 0 0 0 0  Down, Depressed, Hopeless 2 0 0 1 0  PHQ - 2 Score 3 0 0 1 0  Altered sleeping 3 - - 3 -  Tired, decreased energy 3 - - 1 -  Change in appetite 3 - - 2 -  Feeling bad or failure about yourself  2 - - 0 -  Trouble concentrating 2 - - 1 -  Moving slowly or fidgety/restless 0 - - 0 -  Suicidal thoughts  0 - - 0 -  PHQ-9 Score 16 - - 8 -  Difficult doing work/chores Somewhat difficult - - - -   GAD 7 : Generalized Anxiety Score 09/08/2016 04/15/2016  Nervous, Anxious, on Edge 3 3  Control/stop worrying 3 3  Worry too much - different things 3 3  Trouble relaxing 2 3  Restless 1 1  Easily annoyed or irritable 2 3  Afraid - awful might happen 3 3  Total GAD 7 Score 17 19  Anxiety Difficulty - Somewhat difficult      Assessment and Plan:  1. Morbid obesity (HCC): Body mass index is 64 kg/m. Patient have poor meal pattern. She  is sedentary. Discussed about lifestyle changes including diet and exercise. Gave her handout on portion sizes as well. She is interested in bariatric surgery.  - Lipid panel - TSH - VITAMIN D 25 Hydroxy (Vit-D Deficiency, Fractures) - Comprehensive metabolic panel - Amb Referral to Bariatric Surgery - POCT glycosylated hemoglobin (Hb A1C)  2. Depression, unspecified depression type: PHQ9-16. No SI or HI. She has been on Zoloft 100 mg daily. She she doesn't feel that Zoloft is working for her. Will switch to Cymbalta. This may help with her feet pain as well. -Follow up in 2 weeks -DULoxetine (CYMBALTA) 30 MG capsule; Take 1 capsule (30 mg total) by mouth daily.  Dispense: 60 capsule; Refill: 0  3. 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 Added Cymbalta as above. Increased nightly gabapentin dose as well -Gabapentin (NEURONTIN) 300 MG capsule; Take 900 mg (3 capsules) before bedtime and 600 mg (2 capsules) in the morning  Dispense: 150 capsule; Refill: 1  Return in about 2 weeks (around 04/14/2017) for Depression.  Almon Hercules, MD 04/03/17 Pager: [phone]

## 2017-04-05 ENCOUNTER — Ambulatory Visit: Payer: Medicaid Other | Admitting: Sports Medicine

## 2017-04-05 LAB — COMPREHENSIVE METABOLIC PANEL

## 2017-04-05 LAB — LIPID PANEL

## 2017-04-05 LAB — TSH

## 2017-04-05 LAB — VITAMIN D 25 HYDROXY (VIT D DEFICIENCY, FRACTURES)

## 2017-04-12 ENCOUNTER — Ambulatory Visit: Payer: Medicaid Other | Admitting: Sports Medicine

## 2017-08-25 ENCOUNTER — Encounter (HOSPITAL_COMMUNITY): Payer: Self-pay | Admitting: Emergency Medicine

## 2017-08-25 ENCOUNTER — Other Ambulatory Visit: Payer: Self-pay

## 2017-08-25 DIAGNOSIS — I10 Essential (primary) hypertension: Secondary | ICD-10-CM | POA: Insufficient documentation

## 2017-08-25 DIAGNOSIS — Z79899 Other long term (current) drug therapy: Secondary | ICD-10-CM | POA: Insufficient documentation

## 2017-08-25 DIAGNOSIS — N390 Urinary tract infection, site not specified: Secondary | ICD-10-CM | POA: Diagnosis not present

## 2017-08-25 DIAGNOSIS — R1031 Right lower quadrant pain: Secondary | ICD-10-CM | POA: Diagnosis present

## 2017-08-25 LAB — URINALYSIS, ROUTINE W REFLEX MICROSCOPIC
Bilirubin Urine: NEGATIVE
Glucose, UA: NEGATIVE mg/dL
Hgb urine dipstick: NEGATIVE
Ketones, ur: NEGATIVE mg/dL
Nitrite: NEGATIVE
Protein, ur: NEGATIVE mg/dL
Specific Gravity, Urine: 1.018 (ref 1.005–1.030)
pH: 6 (ref 5.0–8.0)

## 2017-08-25 LAB — PREGNANCY, URINE: PREG TEST UR: NEGATIVE

## 2017-08-25 NOTE — ED Triage Notes (Signed)
Pt is c/o burning in her right flank and lower abdomen  States it started about a week ago  Pt states she took urostat for 2 days but the burning got worse  Pt states last night she had a low grade fever and today she has had chills

## 2017-08-26 ENCOUNTER — Emergency Department (HOSPITAL_COMMUNITY)
Admission: EM | Admit: 2017-08-26 | Discharge: 2017-08-26 | Disposition: A | Discharge status: Home / Self Care | Payer: Medicaid Other | Attending: Emergency Medicine | Admitting: Emergency Medicine

## 2017-08-26 DIAGNOSIS — N390 Urinary tract infection, site not specified: Secondary | ICD-10-CM

## 2017-08-26 MED ORDER — CEPHALEXIN 500 MG PO CAPS: 500.0000 mg | ORAL_CAPSULE | Freq: Two times a day (BID) | ORAL | 0 refills | Status: DC

## 2017-08-26 MED ORDER — CEPHALEXIN 500 MG PO CAPS
1000.0000 mg | ORAL_CAPSULE | Freq: Once | ORAL | Status: AC
  Administered 2017-08-26: 1000 mg via ORAL
  Filled 2017-08-26: qty 2

## 2017-08-26 NOTE — ED Provider Notes (Signed)
[facility] Provider Note: Lowella Dell, MD, FACEP  CSN: [id number] MRN: [id number] ARRIVAL: 08/25/17 at 2223 ROOM: WA12/WA12   CHIEF COMPLAINT  Flank Pain   HISTORY OF PRESENT ILLNESS  08/26/17 1:10 AM Jillian Reed is a 37 y.o. female with a one-week history of a burning pain in the lower back.  This radiates around to her right lower quadrant.  Symptoms are consistent with previous urinary tract infections.  She has been taking over-the-counter Uristat without improvement.  Symptoms have been fact been worsening.  She rates her pain as a 5 out of 10.  She has had a low-grade fever as well as chills.  She is not having CVA pain.  She is not having a burning with urination.   Past Medical History:  Diagnosis Date  . Anemia   . Anxiety   . Depression    meds in teens; doing ok now  . GERD (gastroesophageal reflux disease)   . Headache   . Hypertension   . Infection    UTI  . Obesity   . Pregnancy induced hypertension   . Vaginal Pap smear, abnormal     Past Surgical History:  Procedure Laterality Date  . EXTERNAL EAR SURGERY    . THERAPEUTIC ABORTION      Family History  Problem Relation Age of Onset  . Diabetes Mother   . Hypertension Mother   . Arthritis Mother   . Hyperlipidemia Mother   . Diabetes Father   . Hypertension Father   . Depression Father   . Heart disease Maternal Grandmother     Social History   Tobacco Use  . Smoking status: Never Smoker  . Smokeless tobacco: Never Used  Substance Use Topics  . Alcohol use: Yes    Comment: wine every other week  . Drug use: No    Prior to Admission medications   Medication Sig Start Date End Date Taking? Authorizing Provider  sertraline (ZOLOFT) 100 MG tablet Take 100 mg by mouth daily.   Yes [provider]  baclofen (LIORESAL) 10 MG tablet Take 1 tablet (10 mg total) by mouth 3 (three) times daily. Patient not taking: Reported on 08/26/2017 12/31/16   Berton Bon, MD    dicyclomine (BENTYL) 10 MG capsule Take 1 capsule (10 mg total) by mouth 4 (four) times daily - after meals and at bedtime. Patient not taking: Reported on 08/26/2017 06/15/16   Meryl Dare, MD  DULoxetine (CYMBALTA) 30 MG capsule Take 1 capsule (30 mg total) by mouth daily. Patient not taking: Reported on 08/26/2017 03/31/17   Almon Hercules, MD  gabapentin (NEURONTIN) 300 MG capsule Take 900 mg (3 capsules) before bedtime and 600 mg (2 capsules) in the morning Patient not taking: Reported on 08/26/2017 03/31/17   Almon Hercules, MD  NON [facility]  Peripheral Neuropathy Cream- Bupivacaine 1%, Doxepin 3%, Gabapentin 6%, Pentoxifylline 3%, Topiramate 1% Apply 1-2 grams to affected area 3-4 times daily Qty. 120 gm 3 refills    [provider]  pantoprazole (PROTONIX) 40 MG tablet Take 1 tablet (40 mg total) by mouth 2 (two) times daily. Patient not taking: Reported on 08/26/2017 06/15/16   Meryl Dare, MD  ranitidine (ZANTAC) 300 MG capsule Take 1 capsule (300 mg total) by mouth every evening. Patient not taking: Reported on 08/26/2017 06/15/16   Meryl Dare, MD  Vitamin D, Ergocalciferol, (DRISDOL) 50000 units CAPS capsule Take 1 capsule (50,000 Units total) by mouth  every 7 (seven) days. Patient not taking: Reported on 08/26/2017 03/05/16   Raliegh Ip, DO    Allergies Percocet [oxycodone-acetaminophen] and Tramadol   REVIEW OF SYSTEMS  Negative except as noted here or in the History of Present Illness.   PHYSICAL EXAMINATION  Initial Vital Signs Blood pressure (!) 174/81, pulse 86, temperature 98.9 F (37.2 C), temperature source Oral, resp. rate 20, last menstrual period 08/02/2017, SpO2 100 %.  Examination General: Well-developed, morbidly obese female in no acute distress; appearance consistent with age of record HENT: normocephalic; atraumatic Eyes: pupils equal, round and reactive to light; extraocular muscles intact Neck: supple Heart:  regular rate and rhythm Lungs: clear to auscultation bilaterally Abdomen: soft; nondistended; nontender; bowel sounds present GU: No CVA tenderness Extremities: No deformity; full range of motion; pulses normal Neurologic: Awake, alert and oriented; motor function intact in all extremities and symmetric; no facial droop Skin: Warm and dry Psychiatric: Normal mood and affect   RESULTS  Summary of this visit's results, reviewed by myself:   EKG Interpretation  Date/Time:    Ventricular Rate:    PR Interval:    QRS Duration:   QT Interval:    QTC Calculation:   R Axis:     Text Interpretation:        Laboratory Studies: Results for orders placed or performed during the hospital encounter of 08/26/17 (from the past 24 hour(s))  Urinalysis, Routine w reflex microscopic- may I&O cath if menses     Status: Abnormal   Collection Time: 08/25/17 11:10 PM  Result Value Ref Range   Color, Urine YELLOW YELLOW   APPearance HAZY (A) CLEAR   Specific Gravity, Urine 1.018 1.005 - 1.030   pH 6.0 5.0 - 8.0   Glucose, UA NEGATIVE NEGATIVE mg/dL   Hgb urine dipstick NEGATIVE NEGATIVE   Bilirubin Urine NEGATIVE NEGATIVE   Ketones, ur NEGATIVE NEGATIVE mg/dL   Protein, ur NEGATIVE NEGATIVE mg/dL   Nitrite NEGATIVE NEGATIVE   Leukocytes, UA MODERATE (A) NEGATIVE   RBC / HPF 0-5 0 - 5 RBC/hpf   WBC, UA 6-30 0 - 5 WBC/hpf   Bacteria, UA RARE (A) NONE SEEN   Squamous Epithelial / LPF 6-30 (A) NONE SEEN   Mucus PRESENT   Pregnancy, urine     Status: None   Collection Time: 08/25/17 11:10 PM  Result Value Ref Range   Preg Test, Ur NEGATIVE NEGATIVE   Imaging Studies: No results found.  ED COURSE  Nursing notes and initial vitals signs, including pulse oximetry, reviewed.  Vitals:   08/25/17 2251  BP: (!) 174/81  Pulse: 86  Resp: 20  Temp: 98.9 F (37.2 C)  TempSrc: Oral  SpO2: 100%   Given symptoms consistent with previous UTIs, will treat for acute urinary tract  infection.  PROCEDURES    ED DIAGNOSES     ICD-10-CM   1. Lower urinary tract infectious disease N39.0        Warden Buffa, MD 08/26/17 1610

## 2017-08-27 LAB — URINE CULTURE

## 2017-09-01 ENCOUNTER — Telehealth: Payer: Self-pay | Admitting: Student

## 2017-09-01 NOTE — Telephone Encounter (Signed)
Pt says she was prescribed Duloxetine hcl by dr Alanda Slim in nov 2018.  She has run out of that medication.  She was sent home from work today because of an episode.  She uses Statistician at [employer], Please advise

## 2017-09-01 NOTE — Telephone Encounter (Signed)
PCLM for pt to call the office. If pt calls, please confirm that pt understands that Dr.Gonfa will discuss the medication at the time of her appt on 09/05/2017. I was unsure if pt was made aware. Sunday Spillers, CMA

## 2017-09-01 NOTE — Telephone Encounter (Signed)
She needs an office visit. I recommended follow up in two weeks when I saw her in clinic in November. She should have been out of Duloxetine two months ago.  Thanks! Alwyn Ren

## 2017-09-05 ENCOUNTER — Ambulatory Visit: Payer: Medicaid Other | Admitting: Student

## 2017-09-05 NOTE — Telephone Encounter (Signed)
Contacted pt and scheduled her for 09/14/17 @ 10:30 AM with Dr. Alanda Slim to go over her medication. Lamonte Sakai, April D, [place]

## 2017-09-05 NOTE — Telephone Encounter (Signed)
Pt did not come for her visit today, will try and contact her and reschedule her. Lamonte Sakai, Aragon Scarantino D, [place]

## 2017-09-14 ENCOUNTER — Ambulatory Visit: Payer: Medicaid Other | Admitting: Student

## 2017-09-20 ENCOUNTER — Other Ambulatory Visit: Payer: Self-pay

## 2017-09-20 DIAGNOSIS — M79605 Pain in left leg: Principal | ICD-10-CM

## 2017-09-20 DIAGNOSIS — M79604 Pain in right leg: Secondary | ICD-10-CM

## 2017-09-21 MED ORDER — GABAPENTIN 300 MG PO CAPS: ORAL_CAPSULE | ORAL | 1 refills | Status: DC

## 2017-09-24 DIAGNOSIS — H40033 Anatomical narrow angle, bilateral: Secondary | ICD-10-CM | POA: Diagnosis not present

## 2017-09-24 DIAGNOSIS — H16223 Keratoconjunctivitis sicca, not specified as Sjogren's, bilateral: Secondary | ICD-10-CM | POA: Diagnosis not present

## 2017-10-08 ENCOUNTER — Other Ambulatory Visit: Payer: Self-pay | Admitting: Family Medicine

## 2017-10-11 ENCOUNTER — Ambulatory Visit (HOSPITAL_COMMUNITY)
Admission: EM | Admit: 2017-10-11 | Discharge: 2017-10-11 | Disposition: A | Discharge status: Home / Self Care | Payer: Medicaid Other | Attending: Family Medicine | Admitting: Family Medicine

## 2017-10-11 ENCOUNTER — Encounter (HOSPITAL_COMMUNITY): Payer: Self-pay | Admitting: Emergency Medicine

## 2017-10-11 ENCOUNTER — Other Ambulatory Visit: Payer: Self-pay

## 2017-10-11 DIAGNOSIS — Z888 Allergy status to other drugs, medicaments and biological substances status: Secondary | ICD-10-CM | POA: Diagnosis not present

## 2017-10-11 DIAGNOSIS — Z885 Allergy status to narcotic agent status: Secondary | ICD-10-CM | POA: Insufficient documentation

## 2017-10-11 DIAGNOSIS — J069 Acute upper respiratory infection, unspecified: Secondary | ICD-10-CM | POA: Insufficient documentation

## 2017-10-11 DIAGNOSIS — B9789 Other viral agents as the cause of diseases classified elsewhere: Secondary | ICD-10-CM

## 2017-10-11 DIAGNOSIS — I10 Essential (primary) hypertension: Secondary | ICD-10-CM | POA: Diagnosis not present

## 2017-10-11 LAB — POCT RAPID STREP A: Streptococcus, Group A Screen (Direct): NEGATIVE

## 2017-10-11 MED ORDER — FLUTICASONE PROPIONATE 50 MCG/ACT NA SUSP: 1.0000 | Freq: Every day | NASAL | 0 refills | Status: DC

## 2017-10-11 MED ORDER — CETIRIZINE HCL 10 MG PO CAPS: 10.0000 mg | ORAL_CAPSULE | Freq: Every day | ORAL | 0 refills | Status: DC

## 2017-10-11 MED ORDER — PSEUDOEPH-BROMPHEN-DM 30-2-10 MG/5ML PO SYRP: 5.0000 mL | ORAL_SOLUTION | Freq: Four times a day (QID) | ORAL | 0 refills | Status: DC | PRN

## 2017-10-11 NOTE — ED Provider Notes (Signed)
[facility]    CSN: [id number] Arrival date & time: 10/11/17  1538     History   Chief Complaint Chief Complaint  Patient presents with  . URI    HPI Jillian Reed is a 37 y.o. female history of hypertension, GERD presenting today for evaluation of bilateral ear pain, sore throat and coughing.  States that symptoms have been going on for approximately 3 days.  She feels like she has a tickling sensation in her throat which initiates a coughing spell.  Also noting her eyes feel sore.  Denies any watery drainage or eye itching.  She has tried DayQuil, Chloraseptic spray, tea without relief.  Denies fevers.  Tolerating oral intake like normal.  Denies any nausea, vomiting, abdominal pain, change in bowels.  HPI  Past Medical History:  Diagnosis Date  . Anemia   . Anxiety   . Depression    meds in teens; doing ok now  . GERD (gastroesophageal reflux disease)   . Headache   . Hypertension   . Infection    UTI  . Obesity   . Pregnancy induced hypertension   . Vaginal Pap smear, abnormal     Patient Active Problem List   Diagnosis Date Noted  . Bilateral foot pain 02/23/2017  . Hypersomnia 06/18/2016  . Hiatal hernia 04/15/2016  . Leg pain 04/15/2016  . Depression 04/15/2016  . Seasonal allergies 03/04/2016  . Acid reflux 03/04/2016  . Essential hypertension 03/04/2016  . Anemia 03/04/2016  . Cramp of both lower extremities 03/04/2016  . Morbid obesity (HCC)   . Herpetic whitlow 08/29/2014  . Annual physical exam     Past Surgical History:  Procedure Laterality Date  . EXTERNAL EAR SURGERY    . THERAPEUTIC ABORTION      OB History    Gravida  2   Para  2   Term  2   Preterm  0   AB  0   Living  1     SAB  0   TAB  0   Ectopic  0   Multiple  0   Live Births  1            Home Medications    Prior to Admission medications   Medication Sig Start Date End Date Taking? Authorizing Provider    brompheniramine-pseudoephedrine-DM 30-2-10 MG/5ML syrup Take 5 mLs by mouth 4 (four) times daily as needed. 10/11/17   Aissatou Fronczak C, PA-C  Cetirizine HCl 10 MG CAPS Take 1 capsule (10 mg total) by mouth daily for 15 days. 10/11/17 10/26/17  Hanson Medeiros C, PA-C  fluticasone (FLONASE) 50 MCG/ACT nasal spray Place 1-2 sprays into both nostrils daily for 7 days. 10/11/17 10/18/17  Kailia Starry, Junius Creamer, PA-C  NON [facility]  Peripheral Neuropathy Cream- Bupivacaine 1%, Doxepin 3%, Gabapentin 6%, Pentoxifylline 3%, Topiramate 1% Apply 1-2 grams to affected area 3-4 times daily Qty. 120 gm 3 refills    [provider]    Family History Family History  Problem Relation Age of Onset  . Diabetes Mother   . Hypertension Mother   . Arthritis Mother   . Hyperlipidemia Mother   . Diabetes Father   . Hypertension Father   . Depression Father   . Heart disease Maternal Grandmother     Social History Social History   Tobacco Use  . Smoking status: Never Smoker  . Smokeless tobacco: Never Used  Substance Use Topics  . Alcohol use: Yes  Comment: wine every other week  . Drug use: No     Allergies   Percocet [oxycodone-acetaminophen] and Tramadol   Review of Systems Review of Systems  Constitutional: Negative for chills, fatigue and fever.  HENT: Positive for congestion, ear pain, rhinorrhea and sore throat. Negative for sinus pressure and trouble swallowing.   Respiratory: Positive for cough. Negative for chest tightness and shortness of breath.   Cardiovascular: Negative for chest pain.  Gastrointestinal: Negative for abdominal pain, nausea and vomiting.  Musculoskeletal: Negative for myalgias.  Skin: Negative for rash.  Neurological: Negative for dizziness, light-headedness and headaches.     Physical Exam Triage Vital Signs ED Triage Vitals  Enc Vitals Group     BP 10/11/17 1553 136/73     Pulse Rate 10/11/17 1553 84     Resp 10/11/17 1553 (!)  22     Temp 10/11/17 1553 98.6 F (37 C)     Temp Source 10/11/17 1553 Oral     SpO2 10/11/17 1553 100 %     Weight --      Height --      Head Circumference --      Peak Flow --      Pain Score 10/11/17 1551 5     Pain Loc --      Pain Edu? --      Excl. in GC? --    No data found.  Updated Vital Signs BP 136/73 (BP Location: Left Arm) Comment (BP Location): regular cuff on left forearm  Pulse 84   Temp 98.6 F (37 C) (Oral)   Resp (!) 22   LMP 09/30/2017   SpO2 100%   Visual Acuity Right Eye Distance:   Left Eye Distance:   Bilateral Distance:    Right Eye Near:   Left Eye Near:    Bilateral Near:     Physical Exam  Constitutional: She appears well-developed and well-nourished. No distress.  HENT:  Head: Normocephalic and atraumatic.  Bilateral TMs not erythematous, good cone of light, nasal mucosa erythematous with swollen turbinates, no rhinorrhea present, posterior oropharynx erythematous, no tonsillar enlargement or exudate.  Eyes: Conjunctivae are normal.  Neck: Neck supple.  Cardiovascular: Normal rate and regular rhythm.  No murmur heard. Pulmonary/Chest: Effort normal and breath sounds normal. No respiratory distress.  Breathing comfortably at rest, CTA BL  Abdominal: Soft. There is no tenderness.  Musculoskeletal: She exhibits no edema.  Neurological: She is alert.  Skin: Skin is warm and dry.  Psychiatric: She has a normal mood and affect.  Nursing note and vitals reviewed.    UC Treatments / Results  Labs (all labs ordered are listed, but only abnormal results are displayed) Labs Reviewed  CULTURE, GROUP A STREP [facility])  POCT RAPID STREP A    EKG None Radiology No results found.  Procedures Procedures (including critical care time)  Medications Ordered in UC Medications - No data to display   Initial Impression / Assessment and Plan / UC Course  I have reviewed the triage vital signs and the nursing notes.  Pertinent labs &  imaging results that were available during my care of the patient were reviewed by me and considered in my medical decision making (see chart for details).     Patient likely with viral URI versus allergic rhinitis.  Strep test negative.  Will recommend symptomatic management.  Daily Zyrtec and Flonase.  Cough syrup provided. Discussed strict return precautions. Patient verbalized understanding and is agreeable with plan.  Final Clinical Impressions(s) / UC Diagnoses   Final diagnoses:  Viral URI with cough    ED Discharge Orders        Ordered    Cetirizine HCl 10 MG CAPS  Daily     10/11/17 1624    fluticasone (FLONASE) 50 MCG/ACT nasal spray  Daily     10/11/17 1624    brompheniramine-pseudoephedrine-DM 30-2-10 MG/5ML syrup  4 times daily PRN     10/11/17 1624       Controlled Substance Prescriptions [place] Controlled Substance Registry consulted? Not Applicable   Lew Dawes, [place] 10/11/17 1642

## 2017-10-11 NOTE — ED Triage Notes (Signed)
Reports 3 day history of symptoms: sore throat, tickling in right side of throat, cold sore, ears sore.

## 2017-10-11 NOTE — Discharge Instructions (Signed)
Please begin daily Zyrtec as well as daily Flonase nasal spray.  Please take both of these consistently for the next 1-2 weeks.  I have also sent in a cough syrup for you to use, but please continue using warm tea and honey.  For your sore throat you may also try salt water gargles, Cepacol lozenges.  I expect symptoms to gradually improve over the next week.  Please return if symptoms not improving, worsening or changing.

## 2017-10-14 LAB — CULTURE, GROUP A STREP (THRC)

## 2017-11-18 DIAGNOSIS — Z6841 Body Mass Index (BMI) 40.0 and over, adult: Secondary | ICD-10-CM | POA: Diagnosis not present

## 2017-12-30 DIAGNOSIS — Z131 Encounter for screening for diabetes mellitus: Secondary | ICD-10-CM | POA: Diagnosis not present

## 2017-12-30 DIAGNOSIS — Z136 Encounter for screening for cardiovascular disorders: Secondary | ICD-10-CM | POA: Diagnosis not present

## 2017-12-30 DIAGNOSIS — Z6841 Body Mass Index (BMI) 40.0 and over, adult: Secondary | ICD-10-CM | POA: Diagnosis not present

## 2017-12-30 DIAGNOSIS — Z13220 Encounter for screening for lipoid disorders: Secondary | ICD-10-CM | POA: Diagnosis not present

## 2017-12-30 DIAGNOSIS — K219 Gastro-esophageal reflux disease without esophagitis: Secondary | ICD-10-CM | POA: Diagnosis not present

## 2018-01-11 ENCOUNTER — Other Ambulatory Visit: Payer: Self-pay

## 2018-01-11 ENCOUNTER — Encounter: Payer: Self-pay | Admitting: Family Medicine

## 2018-01-11 ENCOUNTER — Ambulatory Visit: Payer: Medicaid Other | Admitting: Family Medicine

## 2018-01-11 DIAGNOSIS — D509 Iron deficiency anemia, unspecified: Secondary | ICD-10-CM

## 2018-01-11 DIAGNOSIS — M79605 Pain in left leg: Secondary | ICD-10-CM | POA: Diagnosis not present

## 2018-01-11 DIAGNOSIS — G5793 Unspecified mononeuropathy of bilateral lower limbs: Secondary | ICD-10-CM

## 2018-01-11 DIAGNOSIS — M79604 Pain in right leg: Secondary | ICD-10-CM | POA: Diagnosis not present

## 2018-01-11 DIAGNOSIS — G5790 Unspecified mononeuropathy of unspecified lower limb: Secondary | ICD-10-CM | POA: Insufficient documentation

## 2018-01-11 LAB — POCT GLYCOSYLATED HEMOGLOBIN (HGB A1C): Hemoglobin A1C: 5.8 % — AB (ref 4.0–5.6)

## 2018-01-11 MED ORDER — DULOXETINE HCL 30 MG PO CPEP
30.0000 mg | ORAL_CAPSULE | Freq: Every day | ORAL | 3 refills | Status: DC
Start: 2018-01-11 — End: 2018-07-10

## 2018-01-11 MED ORDER — GABAPENTIN 300 MG PO CAPS: 300.0000 mg | ORAL_CAPSULE | Freq: Two times a day (BID) | ORAL | 3 refills | Status: AC

## 2018-01-11 NOTE — Patient Instructions (Signed)
I am not sure why you are having leg pain.  My two leading diagnoses are  Neuropathy Back problems. I did a bunch of blood work and will call with results. I will call.  Also print off the results from MyChart and take to your surgeon. I refilled the gabapentin and will try you on duloxetine for the nerve pain. Plan on seeing Dr. Chanetta Marshall in 6 weeks.  That should be enough time to tell whether the duloxetine is helping.  Also, you will have had your bariatric surgery consult.

## 2018-01-12 ENCOUNTER — Encounter: Payer: Self-pay | Admitting: Family Medicine

## 2018-01-12 DIAGNOSIS — D509 Iron deficiency anemia, unspecified: Secondary | ICD-10-CM | POA: Insufficient documentation

## 2018-01-12 MED ORDER — FERROUS SULFATE 325 (65 FE) MG PO TABS: 325.0000 mg | ORAL_TABLET | Freq: Every day | ORAL | 3 refills | Status: DC

## 2018-01-12 NOTE — Assessment & Plan Note (Signed)
Clearly contributing to leg pain.  She has plans for bariatric surgery.

## 2018-01-12 NOTE — Progress Notes (Signed)
   Subjective:    Patient ID: Jillian Reed, female    DOB: Apr 03, 1981, 37 y.o.   MRN: [id number]  HPI  Leg discomfort for years, classified as restless leg.  Worsened over past 2 months.  Then, one week ago, different bilateral leg pain shooting from hips to feet right=left.  Bilateral numbness of feet.  No saddle anesthesia.  No bowel or bladder incontinence.  No hx of cancer.  No fever.  Does not appear to have pain in joints.  Reviewed old knee and hip films.  No old back films.  States she has had epidural back injections at some point.  She is maxed out on gabapentin due to drowsiness side effect.  Morbid obesity: Weight continues to rise.  Now on phentermine.  Has appointment soon with bariatric surg.  Surgery would tentatively be in 05/2018    Review of Systems     Objective:   Physical Exam  Morbid obesity Increased lumbar lordosis FROM of hips and knees Normal pulses both feet. Diminished but symetric DTRs in patella and achilles. Diminished sensation both feet.        Assessment & Plan:

## 2018-01-12 NOTE — Assessment & Plan Note (Addendum)
Differential includes neuropathy, radiculopathy, arthropathy, restless leg.  Continue gabapentin.  Add duloxetine.  Check LS spine films. CBC suggests iron deficiency anemia, which can be associated with worsening restless leg.  Will RX and see if it improves symptoms.

## 2018-01-12 NOTE — Addendum Note (Signed)
Addended by: Moses Manners on: 01/12/2018 03:58 PM   Modules accepted: Orders

## 2018-01-12 NOTE — Assessment & Plan Note (Addendum)
Most likely diagnosis.  Spinal stenosis also possible.  Less likely radiculopathy given bilateral.  No red flag symptoms.  Check for reversible causes of neuropathy.

## 2018-01-13 ENCOUNTER — Ambulatory Visit
Admission: RE | Admit: 2018-01-13 | Discharge: 2018-01-13 | Disposition: A | Discharge status: Home / Self Care | Payer: Medicaid Other | Source: Ambulatory Visit | Attending: Family Medicine | Admitting: Family Medicine

## 2018-01-13 DIAGNOSIS — M79605 Pain in left leg: Principal | ICD-10-CM

## 2018-01-13 DIAGNOSIS — M545 Low back pain: Secondary | ICD-10-CM | POA: Diagnosis not present

## 2018-01-13 DIAGNOSIS — M79604 Pain in right leg: Secondary | ICD-10-CM

## 2018-01-13 NOTE — Progress Notes (Signed)
Patient ID: Jillian Reed, female   DOB: 09-07-1980, 37 y.o.   MRN: [id number] Called.  Mild iron deficiency.  Iron replacement may help restless leg.  Facet arthritis and degenerative disc disease is not surprising.  Continue plan of cymbalta, iron, pursue bariatric surgery and FU with PCP.

## 2018-01-14 LAB — CMP14+EGFR
ALBUMIN: 3.7 g/dL (ref 3.5–5.5)
ALK PHOS: 93 IU/L (ref 39–117)
ALT: 10 IU/L (ref 0–32)
AST: 10 IU/L (ref 0–40)
Albumin/Globulin Ratio: 1.1 — ABNORMAL LOW (ref 1.2–2.2)
BILIRUBIN TOTAL: 0.3 mg/dL (ref 0.0–1.2)
BUN / CREAT RATIO: 10 (ref 9–23)
BUN: 9 mg/dL (ref 6–20)
CHLORIDE: 101 mmol/L (ref 96–106)
CO2: 27 mmol/L (ref 20–29)
Calcium: 9.3 mg/dL (ref 8.7–10.2)
Creatinine, Ser: 0.89 mg/dL (ref 0.57–1.00)
GFR calc Af Amer: 96 mL/min/{1.73_m2} (ref >59)
GFR calc non Af Amer: 84 mL/min/{1.73_m2} (ref >59)
GLUCOSE: 104 mg/dL — AB (ref 65–99)
Globulin, Total: 3.4 g/dL (ref 1.5–4.5)
Potassium: 4.2 mmol/L (ref 3.5–5.2)
SODIUM: 140 mmol/L (ref 134–144)
Total Protein: 7.1 g/dL (ref 6.0–8.5)

## 2018-01-14 LAB — CBC
HEMATOCRIT: 33.9 % — AB (ref 34.0–46.6)
Hemoglobin: 10.6 g/dL — ABNORMAL LOW (ref 11.1–15.9)
MCH: 23.5 pg — ABNORMAL LOW (ref 26.6–33.0)
MCHC: 31.3 g/dL — AB (ref 31.5–35.7)
MCV: 75 fL — ABNORMAL LOW (ref 79–97)
PLATELETS: 414 10*3/uL (ref 150–450)
RBC: 4.52 x10E6/uL (ref 3.77–5.28)
RDW: 16.1 % — AB (ref 12.3–15.4)
WBC: 8.8 10*3/uL (ref 3.4–10.8)

## 2018-01-14 LAB — VITAMIN B2, WHOLE BLOOD: VITAMIN B2, WHOLE BLOOD: 176 ug/L (ref 137–370)

## 2018-01-14 LAB — TSH: TSH: 0.521 u[IU]/mL (ref 0.450–4.500)

## 2018-01-18 ENCOUNTER — Telehealth: Payer: Self-pay | Admitting: Family Medicine

## 2018-01-18 NOTE — Telephone Encounter (Signed)
Clinical info completed on Bariatric Support form.  Place form in Dr. Christena Flake box for completion.  Lamonte Sakai, April D, [place]

## 2018-01-18 NOTE — Telephone Encounter (Signed)
Patient left message she is returning a call. This telephone note is open but no documentation.  Call back is [phone]  Ples Specter, RN [facility] RN)

## 2018-01-18 NOTE — Telephone Encounter (Signed)
Bariatric surgery support letter form dropped off for at front desk for completion.  Verified that patient section of form has been completed.  Last DOS 01/11/18 Jillian Reed. Patient stated they discussed at visit.  Placed form in white team folder to be completed by clinical staff.  [name]

## 2018-01-25 NOTE — Telephone Encounter (Signed)
Returned to [employer].

## 2018-01-25 NOTE — Telephone Encounter (Signed)
Completed form faxed to [facility] as specified by patient. Left message for patient so she is aware. Placed in batch scanning after fax.  Ples Specter, RN [facility] RN)

## 2018-01-26 DIAGNOSIS — F329 Major depressive disorder, single episode, unspecified: Secondary | ICD-10-CM | POA: Diagnosis not present

## 2018-01-26 DIAGNOSIS — Z6841 Body Mass Index (BMI) 40.0 and over, adult: Secondary | ICD-10-CM | POA: Diagnosis not present

## 2018-01-26 DIAGNOSIS — K219 Gastro-esophageal reflux disease without esophagitis: Secondary | ICD-10-CM | POA: Diagnosis not present

## 2018-02-09 DIAGNOSIS — Z6841 Body Mass Index (BMI) 40.0 and over, adult: Secondary | ICD-10-CM | POA: Diagnosis not present

## 2018-03-07 DIAGNOSIS — D509 Iron deficiency anemia, unspecified: Secondary | ICD-10-CM | POA: Diagnosis not present

## 2018-03-07 DIAGNOSIS — R7303 Prediabetes: Secondary | ICD-10-CM | POA: Diagnosis not present

## 2018-03-07 DIAGNOSIS — E559 Vitamin D deficiency, unspecified: Secondary | ICD-10-CM | POA: Diagnosis not present

## 2018-03-07 DIAGNOSIS — Z6841 Body Mass Index (BMI) 40.0 and over, adult: Secondary | ICD-10-CM | POA: Diagnosis not present

## 2018-03-14 DIAGNOSIS — R7303 Prediabetes: Secondary | ICD-10-CM | POA: Diagnosis not present

## 2018-03-14 DIAGNOSIS — E559 Vitamin D deficiency, unspecified: Secondary | ICD-10-CM | POA: Diagnosis not present

## 2018-03-14 DIAGNOSIS — K219 Gastro-esophageal reflux disease without esophagitis: Secondary | ICD-10-CM | POA: Diagnosis not present

## 2018-03-14 DIAGNOSIS — Z6841 Body Mass Index (BMI) 40.0 and over, adult: Secondary | ICD-10-CM | POA: Diagnosis not present

## 2018-03-14 DIAGNOSIS — D500 Iron deficiency anemia secondary to blood loss (chronic): Secondary | ICD-10-CM | POA: Diagnosis not present

## 2018-03-14 DIAGNOSIS — F329 Major depressive disorder, single episode, unspecified: Secondary | ICD-10-CM | POA: Diagnosis not present

## 2018-03-15 DIAGNOSIS — R7303 Prediabetes: Secondary | ICD-10-CM | POA: Diagnosis not present

## 2018-03-15 DIAGNOSIS — Z6841 Body Mass Index (BMI) 40.0 and over, adult: Secondary | ICD-10-CM | POA: Diagnosis not present

## 2018-03-15 DIAGNOSIS — Z7189 Other specified counseling: Secondary | ICD-10-CM | POA: Diagnosis not present

## 2018-04-05 DIAGNOSIS — E559 Vitamin D deficiency, unspecified: Secondary | ICD-10-CM | POA: Diagnosis not present

## 2018-04-05 DIAGNOSIS — R7303 Prediabetes: Secondary | ICD-10-CM | POA: Diagnosis not present

## 2018-04-05 DIAGNOSIS — D500 Iron deficiency anemia secondary to blood loss (chronic): Secondary | ICD-10-CM | POA: Diagnosis not present

## 2018-04-05 DIAGNOSIS — Z6841 Body Mass Index (BMI) 40.0 and over, adult: Secondary | ICD-10-CM | POA: Diagnosis not present

## 2018-04-11 DIAGNOSIS — D500 Iron deficiency anemia secondary to blood loss (chronic): Secondary | ICD-10-CM | POA: Diagnosis not present

## 2018-04-17 DIAGNOSIS — Z7189 Other specified counseling: Secondary | ICD-10-CM | POA: Diagnosis not present

## 2018-04-18 DIAGNOSIS — D500 Iron deficiency anemia secondary to blood loss (chronic): Secondary | ICD-10-CM | POA: Diagnosis not present

## 2018-04-28 DIAGNOSIS — Z6841 Body Mass Index (BMI) 40.0 and over, adult: Secondary | ICD-10-CM | POA: Diagnosis not present

## 2018-04-28 DIAGNOSIS — D500 Iron deficiency anemia secondary to blood loss (chronic): Secondary | ICD-10-CM | POA: Diagnosis not present

## 2018-04-28 DIAGNOSIS — R7303 Prediabetes: Secondary | ICD-10-CM | POA: Diagnosis not present

## 2018-04-28 DIAGNOSIS — E559 Vitamin D deficiency, unspecified: Secondary | ICD-10-CM | POA: Diagnosis not present

## 2018-04-30 ENCOUNTER — Emergency Department (HOSPITAL_BASED_OUTPATIENT_CLINIC_OR_DEPARTMENT_OTHER): Payer: Medicaid Other

## 2018-04-30 ENCOUNTER — Encounter (HOSPITAL_COMMUNITY): Payer: Self-pay | Admitting: Emergency Medicine

## 2018-04-30 ENCOUNTER — Emergency Department (HOSPITAL_COMMUNITY)
Admission: EM | Admit: 2018-04-30 | Discharge: 2018-04-30 | Disposition: A | Discharge status: Home / Self Care | Payer: Medicaid Other | Attending: Emergency Medicine | Admitting: Emergency Medicine

## 2018-04-30 ENCOUNTER — Other Ambulatory Visit: Payer: Self-pay

## 2018-04-30 DIAGNOSIS — M5442 Lumbago with sciatica, left side: Secondary | ICD-10-CM | POA: Diagnosis not present

## 2018-04-30 DIAGNOSIS — R2242 Localized swelling, mass and lump, left lower limb: Secondary | ICD-10-CM | POA: Diagnosis present

## 2018-04-30 DIAGNOSIS — M5432 Sciatica, left side: Secondary | ICD-10-CM | POA: Diagnosis not present

## 2018-04-30 DIAGNOSIS — Z79899 Other long term (current) drug therapy: Secondary | ICD-10-CM | POA: Diagnosis not present

## 2018-04-30 DIAGNOSIS — I10 Essential (primary) hypertension: Secondary | ICD-10-CM | POA: Diagnosis not present

## 2018-04-30 DIAGNOSIS — M7989 Other specified soft tissue disorders: Secondary | ICD-10-CM | POA: Diagnosis not present

## 2018-04-30 MED ORDER — PREDNISONE 10 MG (21) PO TBPK: ORAL_TABLET | Freq: Every day | ORAL | 0 refills | Status: DC

## 2018-04-30 NOTE — ED Provider Notes (Signed)
[facility] EMERGENCY DEPARTMENT Provider Note   CSN: [id number] Arrival date & time: 04/30/18  1450     History   Chief Complaint Chief Complaint  Patient presents with  . Leg Swelling  . Leg Pain    HPI Jillian Reed is a 37 y.o. female with a past medical history of hypertension, obesity who presents to ED for 1 week history of left leg swelling and pain.  She states that she is having sharp shooting pain from her knee, radiating to her calf and ankle.  She is also noticed "veins popping out."  Patient has not taking medication to help with pain.  She also reports sharp shooting pain from her buttock down her thigh with associated paresthesias.  Denies any loss of sensation, loss of bowel or bladder function.  No history of similar symptoms in the past.  She states that her mother was diagnosed with "5 blood clots" and she would like to be checked for the same.  She denies any OCP use, recent immobilization or history of cancer.  Denies any injuries or falls.  HPI  Past Medical History:  Diagnosis Date  . Anemia   . Anxiety   . Depression    meds in teens; doing ok now  . GERD (gastroesophageal reflux disease)   . Headache   . Hypertension   . Infection    UTI  . Obesity   . Pregnancy induced hypertension   . Vaginal Pap smear, abnormal     Patient Active Problem List   Diagnosis Date Noted  . Iron deficiency anemia 01/12/2018  . 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 Bilateral foot pain 02/23/2017  . Hypersomnia 06/18/2016  . Hiatal hernia 04/15/2016  . Leg pain 04/15/2016  . Depression 04/15/2016  . Seasonal allergies 03/04/2016  . Acid reflux 03/04/2016  . Essential hypertension 03/04/2016  . Anemia 03/04/2016  . Cramp of both lower extremities 03/04/2016  . Morbid obesity (HCC)   . Herpetic whitlow 08/29/2014  . Annual physical exam     Past Surgical History:  Procedure Laterality Date  . EXTERNAL EAR SURGERY    . THERAPEUTIC ABORTION         OB History    Gravida  2   Para  2   Term  2   Preterm  0   AB  0   Living  1     SAB  0   TAB  0   Ectopic  0   Multiple  0   Live Births  1            Home Medications    Prior to Admission medications   Medication Sig Start Date End Date Taking? Authorizing Provider  DULoxetine (CYMBALTA) 30 MG capsule Take 1 capsule (30 mg total) by mouth daily. 01/11/18   Moses Manners, MD  ferrous sulfate 325 (65 FE) MG tablet Take 1 tablet (325 mg total) by mouth daily with breakfast. 01/12/18   Moses Manners, MD  gabapentin (NEURONTIN) 300 MG capsule Take 1 capsule (300 mg total) by mouth 2 (two) times daily. 01/11/18   Moses Manners, MD  phentermine 37.5 MG capsule Take 37.5 mg by mouth every morning.    [provider]  predniSONE (STERAPRED UNI-PAK 21 TAB) 10 MG (21) TBPK tablet Take by mouth daily. Take 6 tabs by mouth daily  for 2 days, then 5 tabs for 2 days, then 4 tabs for 2 days, then  3 tabs for 2 days, 2 tabs for 2 days, then 1 tab by mouth daily for 2 days 04/30/18   Dietrich Pates, PA-C    Family History Family History  Problem Relation Age of Onset  . Diabetes Mother   . Hypertension Mother   . Arthritis Mother   . Hyperlipidemia Mother   . Diabetes Father   . Hypertension Father   . Depression Father   . Heart disease Maternal Grandmother     Social History Social History   Tobacco Use  . Smoking status: Never Smoker  . Smokeless tobacco: Never Used  Substance Use Topics  . Alcohol use: Yes    Comment: wine every other week  . Drug use: No     Allergies   Percocet [oxycodone-acetaminophen] and Tramadol   Review of Systems Review of Systems  Constitutional: Negative for appetite change, chills and fever.  HENT: Negative for ear pain, rhinorrhea, sneezing and sore throat.   Eyes: Negative for photophobia and visual disturbance.  Respiratory: Negative for cough, chest tightness, shortness of breath and wheezing.    Cardiovascular: Positive for leg swelling. Negative for chest pain and palpitations.  Gastrointestinal: Negative for abdominal pain, blood in stool, constipation, diarrhea, nausea and vomiting.  Genitourinary: Negative for dysuria, hematuria and urgency.  Musculoskeletal: Negative for myalgias.  Skin: Negative for rash.  Neurological: Negative for dizziness, weakness and light-headedness.     Physical Exam Updated Vital Signs BP 127/77 (BP Location: Right Arm)   Pulse 83   Temp 98.6 F (37 C) (Oral)   Resp 18   Ht 5\' 7"  (1.702 m)   Wt (!) 186 kg   LMP 04/18/2018   SpO2 100%   BMI 64.22 kg/m   Physical Exam  Constitutional: She appears well-developed and well-nourished. No distress.  HENT:  Head: Normocephalic and atraumatic.  Nose: Nose normal.  Eyes: Conjunctivae and EOM are normal. Left eye exhibits no discharge. No scleral icterus.  Neck: Normal range of motion. Neck supple.  Cardiovascular: Normal rate, regular rhythm, normal heart sounds and intact distal pulses. Exam reveals no gallop and no friction rub.  No murmur heard. Pulmonary/Chest: Effort normal and breath sounds normal. No respiratory distress.  Abdominal: Soft. Bowel sounds are normal. She exhibits no distension. There is no tenderness. There is no guarding.  Musculoskeletal: Normal range of motion. She exhibits edema.  Mild edema of the left ankle.  Nonpitting.  Left calf tenderness.  No overlying skin changes or warmth of joint noted.  Full active and passive range of motion of knee and ankle without difficulty.  Neurological: She is alert. She exhibits normal muscle tone. Coordination normal.  Skin: Skin is warm and dry. No rash noted.  Psychiatric: She has a normal mood and affect.  Nursing note and vitals reviewed.    ED Treatments / Results  Labs (all labs ordered are listed, but only abnormal results are displayed) Labs Reviewed - No data to display  EKG None  Radiology No results  found.  Procedures Procedures (including critical care time)  Medications Ordered in ED Medications - No data to display   Initial Impression / Assessment and Plan / ED Course  I have reviewed the triage vital signs and the nursing notes.  Pertinent labs & imaging results that were available during my care of the patient were reviewed by me and considered in my medical decision making (see chart for details).  Clinical Course as of Apr 30 1606  Wynelle Link Apr 30, 2018  1603 VASCULAR LAB PRELIMINARY  PRELIMINARY  PRELIMINARY  PRELIMINARY  Left lower extremity venous duplex completed.    Preliminary report:  There is no obvious evidence of DVT or SVT noted in the visualized veins of the left lower extremity.  KANADY, CANDACE, RVT 04/30/2018, 4:01 PM   [HK]    Clinical Course User Index [HK] Dietrich Pates, PA-C    37 year old female with a past medical history of hypertension, obesity presents to ED for 1 week history of left leg swelling and pain.  She reports sharp shooting pain from the knee, radiating to calf and ankle.  Also reports "veins popping out."  She also has sharp shooting pain from her buttock with associated paresthesias of her thigh.  No loss of sensation, loss of bowel or bladder function.  She would like to be checked for blood clots.  Denies any OCP use, history of DVT or recent immobilization.  On exam there is mild edema noted of the left ankle.  She denies any injuries.  Area is neurovascularly intact.  Several varicose veins noted.  Calf tenderness noted.  DVT study was negative.  Suspect that her leg symptoms could be due to possible sciatica.  Will give steroids to help with inflammation, advise her to take Tylenol as needed.  Doubt neurological or other emergent cause of symptoms.  Will advise her to return to ED for any severe worsening symptoms.  Patient is hemodynamically stable, in NAD, and able to ambulate in the ED. Evaluation does not show pathology that  would require ongoing emergent intervention or inpatient treatment. I explained the diagnosis to the patient. Pain has been managed and has no complaints prior to discharge. Patient is comfortable with above plan and is stable for discharge at this time. All questions were answered prior to disposition. Strict return precautions for returning to the ED were discussed. Encouraged follow up with PCP.    Portions of this note were generated with Scientist, clinical (histocompatibility and immunogenetics). Dictation errors may occur despite best attempts at proofreading.   Final Clinical Impressions(s) / ED Diagnoses   Final diagnoses:  Sciatica of left side    ED Discharge Orders         Ordered    predniSONE (STERAPRED UNI-PAK 21 TAB) 10 MG (21) TBPK tablet  Daily     04/30/18 1605           Dietrich Pates, PA-C 04/30/18 1608    Tegeler, Canary Brim, MD 04/30/18 [phone]

## 2018-04-30 NOTE — Progress Notes (Signed)
VASCULAR LAB PRELIMINARY  PRELIMINARY  PRELIMINARY  PRELIMINARY  Left lower extremity venous duplex completed.    Preliminary report:  There is no obvious evidence of DVT or SVT noted in the visualized veins of the left lower extremity.  Jillian Reed, RVT 04/30/2018, 4:01 PM

## 2018-04-30 NOTE — ED Triage Notes (Signed)
Pt. Stated, Jillian Reed had left leg pain and swelling for the last 2 days and I have a vein to pop out.

## 2018-04-30 NOTE — Discharge Instructions (Signed)
Take the steroids as directed. Your ultrasound showed that you did not have a blood clot in your left leg.  Return to ED for worsening symptoms, injuries or falls, increased swelling, red hot or tender joint or losing control of your bladder.

## 2018-05-24 DIAGNOSIS — E559 Vitamin D deficiency, unspecified: Secondary | ICD-10-CM | POA: Diagnosis not present

## 2018-05-24 DIAGNOSIS — K219 Gastro-esophageal reflux disease without esophagitis: Secondary | ICD-10-CM | POA: Diagnosis not present

## 2018-05-24 DIAGNOSIS — R7303 Prediabetes: Secondary | ICD-10-CM | POA: Diagnosis not present

## 2018-05-24 DIAGNOSIS — Z6841 Body Mass Index (BMI) 40.0 and over, adult: Secondary | ICD-10-CM | POA: Diagnosis not present

## 2018-05-24 DIAGNOSIS — F329 Major depressive disorder, single episode, unspecified: Secondary | ICD-10-CM | POA: Diagnosis not present

## 2018-05-24 DIAGNOSIS — D500 Iron deficiency anemia secondary to blood loss (chronic): Secondary | ICD-10-CM | POA: Diagnosis not present

## 2018-06-09 ENCOUNTER — Ambulatory Visit (HOSPITAL_COMMUNITY)
Admission: EM | Admit: 2018-06-09 | Discharge: 2018-06-09 | Disposition: A | Discharge status: Home / Self Care | Payer: Medicaid Other | Attending: Internal Medicine | Admitting: Internal Medicine

## 2018-06-09 ENCOUNTER — Encounter (HOSPITAL_COMMUNITY): Payer: Self-pay | Admitting: Emergency Medicine

## 2018-06-09 DIAGNOSIS — J3489 Other specified disorders of nose and nasal sinuses: Secondary | ICD-10-CM | POA: Insufficient documentation

## 2018-06-09 DIAGNOSIS — H6502 Acute serous otitis media, left ear: Secondary | ICD-10-CM | POA: Insufficient documentation

## 2018-06-09 DIAGNOSIS — R0981 Nasal congestion: Secondary | ICD-10-CM

## 2018-06-09 MED ORDER — AMOXICILLIN-POT CLAVULANATE 875-125 MG PO TABS: 1.0000 | ORAL_TABLET | Freq: Two times a day (BID) | ORAL | 0 refills | Status: AC

## 2018-06-09 NOTE — ED Provider Notes (Signed)
[facility]    CSN: [id number] Arrival date & time: 06/09/18  1311     History   Chief Complaint Chief Complaint  Patient presents with  . Flu-Like Symptoms    HPI Jillian Reed is a 37 y.o. female.   37 year old female presents with nasal congestion, post nasal drainage and bilateral ear fullness for the past 4 to 5 days. Had a fever of 102 two days ago but no fever now. Also having a sore throat and today having more left ear pain as well as left facial/tooth pain. Denies any GI symptoms. Having difficulty sleeping due to the congestion. Has taken Benadryl, Advil sinus and honey with minimal relief. Son also sick with similar symptoms and just dx by his Pediatrician today with an ear infection. Other chronic health issues include anxiety, depression, headaches, obesity, GERD and anemia and currently on Cymbalta, Neurontin and Phentermine-Topiramate daily.   The history is provided by the patient.    Past Medical History:  Diagnosis Date  . Anemia   . Anxiety   . Depression    meds in teens; doing ok now  . GERD (gastroesophageal reflux disease)   . Headache   . Hypertension   . Infection    UTI  . Obesity   . Pregnancy induced hypertension   . Vaginal Pap smear, abnormal     Patient Active Problem List   Diagnosis Date Noted  . Iron deficiency anemia 01/12/2018  . 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 Bilateral foot pain 02/23/2017  . Hypersomnia 06/18/2016  . Hiatal hernia 04/15/2016  . Leg pain 04/15/2016  . Depression 04/15/2016  . Seasonal allergies 03/04/2016  . Acid reflux 03/04/2016  . Essential hypertension 03/04/2016  . Anemia 03/04/2016  . Cramp of both lower extremities 03/04/2016  . Morbid obesity (HCC)   . Herpetic whitlow 08/29/2014  . Annual physical exam     Past Surgical History:  Procedure Laterality Date  . EXTERNAL EAR SURGERY    . THERAPEUTIC ABORTION      OB History    Gravida  2   Para  2   Term  2   Preterm  0     AB  0   Living  1     SAB  0   TAB  0   Ectopic  0   Multiple  0   Live Births  1            Home Medications    Prior to Admission medications   Medication Sig Start Date End Date Taking? Authorizing Provider  PHENTERMINE-TOPIRAMATE PO Take by mouth.   Yes [provider]  amoxicillin-clavulanate (AUGMENTIN) 875-125 MG tablet Take 1 tablet by mouth every 12 (twelve) hours for 7 days. 06/09/18 06/16/18  Sudie Grumbling, NP  DULoxetine (CYMBALTA) 30 MG capsule Take 1 capsule (30 mg total) by mouth daily. 01/11/18   Moses Manners, MD  gabapentin (NEURONTIN) 300 MG capsule Take 1 capsule (300 mg total) by mouth 2 (two) times daily. 01/11/18   Moses Manners, MD    Family History Family History  Problem Relation Age of Onset  . Diabetes Mother   . Hypertension Mother   . Arthritis Mother   . Hyperlipidemia Mother   . Diabetes Father   . Hypertension Father   . Depression Father   . Heart disease Maternal Grandmother     Social History Social History   Tobacco Use  . Smoking  status: Never Smoker  . Smokeless tobacco: Never Used  Substance Use Topics  . Alcohol use: Yes    Comment: wine every other week  . Drug use: No     Allergies   Percocet [oxycodone-acetaminophen] and Tramadol   Review of Systems Review of Systems  Constitutional: Positive for appetite change, chills, fatigue and fever.  HENT: Positive for congestion, ear pain, postnasal drip, rhinorrhea, sinus pressure, sinus pain and sore throat. Negative for ear discharge, facial swelling, mouth sores, nosebleeds, sneezing and trouble swallowing.   Eyes: Negative for pain, discharge, redness and itching.  Respiratory: Positive for cough. Negative for chest tightness, shortness of breath and wheezing.   Gastrointestinal: Negative for abdominal pain, diarrhea, nausea and vomiting.  Musculoskeletal: Negative for arthralgias, myalgias, neck pain and neck stiffness.  Skin: Negative  for color change, rash and wound.  Neurological: Positive for headaches. Negative for dizziness, tremors, seizures, syncope, weakness, light-headedness and numbness.  Hematological: Negative for adenopathy. Does not bruise/bleed easily.     Physical Exam Triage Vital Signs ED Triage Vitals  Enc Vitals Group     BP 06/09/18 1322 105/83     Pulse Rate 06/09/18 1322 92     Resp 06/09/18 1322 18     Temp 06/09/18 1322 98.6 F (37 C)     Temp Source 06/09/18 1322 Oral     SpO2 06/09/18 1322 100 %     Weight --      Height --      Head Circumference --      Peak Flow --      Pain Score 06/09/18 1323 5     Pain Loc --      Pain Edu? --      Excl. in GC? --    No data found.  Updated Vital Signs BP 105/83 (BP Location: Left Wrist)   Pulse 92   Temp 98.6 F (37 C) (Oral)   Resp 18   SpO2 100%   Visual Acuity Right Eye Distance:   Left Eye Distance:   Bilateral Distance:    Right Eye Near:   Left Eye Near:    Bilateral Near:     Physical Exam Vitals signs and nursing note reviewed.  Constitutional:      General: She is not in acute distress.    Appearance: She is well-developed and well-groomed. She is obese. She is ill-appearing.     Comments: Patient sitting comfortably on exam table in no acute distress but appears ill.   HENT:     Head: Normocephalic and atraumatic.     Right Ear: Hearing, ear canal and external ear normal. Tympanic membrane is bulging.     Left Ear: Hearing, ear canal and external ear normal. A middle ear effusion is present. Tympanic membrane is erythematous and bulging.     Nose: Mucosal edema, congestion and rhinorrhea present.     Right Sinus: No maxillary sinus tenderness or frontal sinus tenderness.     Left Sinus: No maxillary sinus tenderness or frontal sinus tenderness.     Mouth/Throat:     Mouth: Mucous membranes are moist. No oral lesions.     Palate: No lesions.     Pharynx: Uvula midline. Posterior oropharyngeal erythema present.  No pharyngeal swelling or oropharyngeal exudate.  Eyes:     Extraocular Movements: Extraocular movements intact.     Conjunctiva/sclera: Conjunctivae normal.  Neck:     Musculoskeletal: Normal range of motion and neck supple.  Cardiovascular:  Rate and Rhythm: Normal rate and regular rhythm.     Heart sounds: Normal heart sounds. No murmur.  Pulmonary:     Effort: Pulmonary effort is normal.     Breath sounds: Normal breath sounds and air entry. No decreased breath sounds, wheezing, rhonchi or rales.  Musculoskeletal: Normal range of motion.  Lymphadenopathy:     Cervical: No cervical adenopathy.  Skin:    General: Skin is warm and dry.     Capillary Refill: Capillary refill takes less than 2 seconds.     Findings: No rash.  Neurological:     General: No focal deficit present.     Mental Status: She is alert and oriented to person, place, and time.  Psychiatric:        Mood and Affect: Mood normal.        Behavior: Behavior normal. Behavior is cooperative.        Thought Content: Thought content normal.        Judgment: Judgment normal.      UC Treatments / Results  Labs (all labs ordered are listed, but only abnormal results are displayed) Labs Reviewed - No data to display  EKG None  Radiology No results found.  Procedures Procedures (including critical care time)  Medications Ordered in UC Medications - No data to display  Initial Impression / Assessment and Plan / UC Course  I have reviewed the triage vital signs and the nursing notes.  Pertinent labs & imaging results that were available during my care of the patient were reviewed by me and considered in my medical decision making (see chart for details).    Discussed with patient that she has a left inner ear infection. Recommend start Augmentin 875mg  twice a day as directed. Continue to increase fluids to help loosen up mucus. May continue Benadryl as needed for drainage and Ibuprofen as needed for  pain. Follow-up with her PCP in 3 to 4 days if not improving.  Final Clinical Impressions(s) / UC Diagnoses   Final diagnoses:  Non-recurrent acute serous otitis media of left ear  Nasal congestion with rhinorrhea     Discharge Instructions     Recommend start Augmentin 875mg  twice a day as directed. Continue to increase fluids to help loosen up mucus. May continue Benadryl as needed. Follow-up with your PCP in 3 to 4 days if not improving.     ED Prescriptions    Medication Sig Dispense Auth. Provider   amoxicillin-clavulanate (AUGMENTIN) 875-125 MG tablet Take 1 tablet by mouth every 12 (twelve) hours for 7 days. 14 tablet Sudie Grumbling, NP     Controlled Substance Prescriptions [place] Controlled Substance Registry consulted? Not Applicable   Sudie Grumbling, NP 06/09/18 1759

## 2018-06-09 NOTE — ED Triage Notes (Signed)
Pt presents to [facility] for assessment of left ear pain and throat drainage, cough, gagging with strong cough, and left tooth pain.

## 2018-06-09 NOTE — Discharge Instructions (Signed)
Recommend start Augmentin 875mg  twice a day as directed. Continue to increase fluids to help loosen up mucus. May continue Benadryl as needed. Follow-up with your PCP in 3 to 4 days if not improving.

## 2018-07-08 ENCOUNTER — Other Ambulatory Visit: Payer: Self-pay | Admitting: Family Medicine

## 2018-07-08 DIAGNOSIS — G5793 Unspecified mononeuropathy of bilateral lower limbs: Secondary | ICD-10-CM

## 2018-07-15 ENCOUNTER — Encounter (HOSPITAL_COMMUNITY): Payer: Self-pay | Admitting: Emergency Medicine

## 2018-07-15 ENCOUNTER — Other Ambulatory Visit: Payer: Self-pay

## 2018-07-15 ENCOUNTER — Ambulatory Visit (HOSPITAL_COMMUNITY)
Admission: EM | Admit: 2018-07-15 | Discharge: 2018-07-15 | Disposition: A | Discharge status: Home / Self Care | Payer: Worker's Compensation | Attending: Emergency Medicine | Admitting: Emergency Medicine

## 2018-07-15 ENCOUNTER — Ambulatory Visit (INDEPENDENT_AMBULATORY_CARE_PROVIDER_SITE_OTHER): Payer: Worker's Compensation

## 2018-07-15 DIAGNOSIS — S4991XA Unspecified injury of right shoulder and upper arm, initial encounter: Secondary | ICD-10-CM | POA: Diagnosis not present

## 2018-07-15 DIAGNOSIS — M79604 Pain in right leg: Secondary | ICD-10-CM

## 2018-07-15 DIAGNOSIS — S59911A Unspecified injury of right forearm, initial encounter: Secondary | ICD-10-CM | POA: Diagnosis not present

## 2018-07-15 DIAGNOSIS — M79601 Pain in right arm: Secondary | ICD-10-CM

## 2018-07-15 DIAGNOSIS — W19XXXA Unspecified fall, initial encounter: Secondary | ICD-10-CM

## 2018-07-15 DIAGNOSIS — M25571 Pain in right ankle and joints of right foot: Secondary | ICD-10-CM | POA: Diagnosis not present

## 2018-07-15 DIAGNOSIS — M7989 Other specified soft tissue disorders: Secondary | ICD-10-CM | POA: Diagnosis not present

## 2018-07-15 DIAGNOSIS — M25511 Pain in right shoulder: Secondary | ICD-10-CM | POA: Diagnosis not present

## 2018-07-15 DIAGNOSIS — S99911A Unspecified injury of right ankle, initial encounter: Secondary | ICD-10-CM | POA: Diagnosis not present

## 2018-07-15 MED ORDER — IBUPROFEN 600 MG PO TABS: 600.0000 mg | ORAL_TABLET | Freq: Four times a day (QID) | ORAL | 0 refills | Status: AC | PRN

## 2018-07-15 MED ORDER — CYCLOBENZAPRINE HCL 5 MG PO TABS: 5.0000 mg | ORAL_TABLET | Freq: Two times a day (BID) | ORAL | 0 refills | Status: AC | PRN

## 2018-07-15 NOTE — Discharge Instructions (Addendum)
Use anti-inflammatories for pain/swelling. You may take up to 600 mg Ibuprofen every 8 hours with food. You may supplement Ibuprofen with Tylenol [phone] mg every 8 hours.   You may use flexeril as needed to help with pain. This is a muscle relaxer and causes sedation- please use only at bedtime or when you will be home and not have to drive/work- begin with 1 tablet, may take 2 if not causing significant drowsiness  Ice  Rest  Follow up if symptoms not resolving or worsening

## 2018-07-15 NOTE — ED Triage Notes (Signed)
The patient presented to the [facility] with a complaint or right wrist, arm and ankle pain secondary to a slip and fall from a standing position at work last night at work.

## 2018-07-16 NOTE — ED Provider Notes (Signed)
[facility]    CSN: [id number] Arrival date & time: 07/15/18  1459     History   Chief Complaint Chief Complaint  Patient presents with  . Fall    HPI Jillian Reed is a 38 y.o. female history of neuropathy, hypertension, obesity presenting today for evaluation of right-sided body pain secondary to a fall.  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  States that her left leg went behind her as she rolled her right ankle.  Since her pain has progressively worsened.  She took her gabapentin and Cymbalta this morning in hopes that this would help, but she is continued to be in significant pain.  She notes that her right arm including wrist elbow and shoulder she has significant pain with, any movement triggers this pain.  She also has pain to her lower back, more significantly on her right side and this extends into her hip and right leg.  States that when she is walking most of her pain is felt in her ankle.  Does endorse a pins and needle sensation, but states that this is normal for her due to her neuropathy.  She denies any loss of control of urination or bowel habits.  Denies saddle anesthesia.  Denies chest pain or shortness of breath.  HPI  Past Medical History:  Diagnosis Date  . Anemia   . Anxiety   . Depression    meds in teens; doing ok now  . GERD (gastroesophageal reflux disease)   . Headache   . Hypertension   . Infection    UTI  . Obesity   . Pregnancy induced hypertension   . Vaginal Pap smear, abnormal     Patient Active Problem List   Diagnosis Date Noted  . Iron deficiency anemia 01/12/2018  . 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 Bilateral foot pain 02/23/2017  . Hypersomnia 06/18/2016  . Hiatal hernia 04/15/2016  . Leg pain 04/15/2016  . Depression 04/15/2016  . Seasonal allergies 03/04/2016  . Acid reflux 03/04/2016  . Essential hypertension 03/04/2016  . Anemia 03/04/2016  . Cramp of both  lower extremities 03/04/2016  . Morbid obesity (HCC)   . Herpetic whitlow 08/29/2014  . Annual physical exam     Past Surgical History:  Procedure Laterality Date  . EXTERNAL EAR SURGERY    . THERAPEUTIC ABORTION      OB History    Gravida  2   Para  2   Term  2   Preterm  0   AB  0   Living  1     SAB  0   TAB  0   Ectopic  0   Multiple  0   Live Births  1            Home Medications    Prior to Admission medications   Medication Sig Start Date End Date Taking? Authorizing Provider  cyclobenzaprine (FLEXERIL) 5 MG tablet Take 1-2 tablets (5-10 mg total) by mouth 2 (two) times daily as needed for muscle spasms. 07/15/18   Mischele Detter C, PA-C  DULoxetine (CYMBALTA) 30 MG capsule TAKE 1 CAPSULE BY MOUTH ONCE DAILY 07/10/18   Garth Bignessimberlake, Kathryn, MD  gabapentin (NEURONTIN) 300 MG capsule Take 1 capsule (300 mg total) by mouth 2 (two) times daily. 01/11/18   Moses MannersHensel, William A, MD  ibuprofen (ADVIL,MOTRIN) 600 MG tablet Take 1 tablet (600 mg total) by mouth every  6 (six) hours as needed. 07/15/18   Arlene Brickel C, PA-C  PHENTERMINE-TOPIRAMATE PO Take by mouth.    [provider]    Family History Family History  Problem Relation Age of Onset  . Diabetes Mother   . Hypertension Mother   . Arthritis Mother   . Hyperlipidemia Mother   . Diabetes Father   . Hypertension Father   . Depression Father   . Heart disease Maternal Grandmother     Social History Social History   Tobacco Use  . Smoking status: Never Smoker  . Smokeless tobacco: Never Used  Substance Use Topics  . Alcohol use: Yes    Comment: wine every other week  . Drug use: No     Allergies   Percocet [oxycodone-acetaminophen] and Tramadol   Review of Systems Review of Systems  Constitutional: Negative for activity change, chills, diaphoresis and fatigue.  HENT: Negative for ear pain, tinnitus and trouble swallowing.   Eyes: Negative for photophobia and visual  disturbance.  Respiratory: Negative for cough, chest tightness and shortness of breath.   Cardiovascular: Negative for chest pain and leg swelling.  Gastrointestinal: Negative for abdominal pain, blood in stool, nausea and vomiting.  Musculoskeletal: Positive for arthralgias, back pain, gait problem and myalgias. Negative for neck pain and neck stiffness.  Skin: Negative for color change and wound.  Neurological: Negative for dizziness, weakness, light-headedness, numbness and headaches.     Physical Exam Triage Vital Signs ED Triage Vitals  Enc Vitals Group     BP 07/15/18 1526 140/86     Pulse Rate 07/15/18 1526 89     Resp 07/15/18 1526 20     Temp 07/15/18 1526 98.2 F (36.8 C)     Temp Source 07/15/18 1526 Oral     SpO2 07/15/18 1526 100 %     Weight --      Height --      Head Circumference --      Peak Flow --      Pain Score 07/15/18 1525 8     Pain Loc --      Pain Edu? --      Excl. in GC? --    No data found.  Updated Vital Signs BP 140/86 (BP Location: Left Wrist)   Pulse 89   Temp 98.2 F (36.8 C) (Oral)   Resp 20   LMP 05/29/2018   SpO2 100%   Visual Acuity Right Eye Distance:   Left Eye Distance:   Bilateral Distance:    Right Eye Near:   Left Eye Near:    Bilateral Near:     Physical Exam Vitals signs and nursing note reviewed.  Constitutional:      General: She is not in acute distress.    Appearance: She is well-developed.  HENT:     Head: Normocephalic and atraumatic.  Eyes:     Extraocular Movements: Extraocular movements intact.     Conjunctiva/sclera: Conjunctivae normal.     Pupils: Pupils are equal, round, and reactive to light.  Neck:     Musculoskeletal: Neck supple.  Cardiovascular:     Rate and Rhythm: Normal rate and regular rhythm.     Heart sounds: No murmur.  Pulmonary:     Effort: Pulmonary effort is normal. No respiratory distress.     Breath sounds: Normal breath sounds.  Abdominal:     Palpations: Abdomen is  soft.     Tenderness: There is no abdominal tenderness.  Musculoskeletal:  Comments: Patient holding right arm awkwardly, avoiding any bending motions of wrist elbow and shoulder, tender to palpation throughout right clavicle, scapular spine and AC joint, patient very resistant to movement of her shoulder, patient has some active motion at elbow, but resistant to full extension, tender to palpation throughout forearm and distal radius, able to fully move fingers, radial pulse 2+, no snuffbox tenderness  Nontender to palpation throughout cervical, thoracic and lumbar spine midline, increased tenderness throughout right lateral lumbar musculature extending into glutes and sacral area  Right leg: Patient does actively bend knee when moving from supine to seated position, she is resistant to bend knee during exam, mild erythema over knee,  No significant swelling or deformity to ankle, tenderness to palpation of bilateral medial lateral malleolus  Dorsalis pedis 2+  Skin:    General: Skin is warm and dry.  Neurological:     Mental Status: She is alert.      UC Treatments / Results  Labs (all labs ordered are listed, but only abnormal results are displayed) Labs Reviewed - No data to display  EKG None  Radiology Dg Shoulder Right  Result Date: 07/15/2018 CLINICAL DATA:  Right shoulder pain post fall. EXAM: RIGHT SHOULDER - 2+ VIEW COMPARISON:  None. FINDINGS: There is no evidence of fracture or dislocation. There is no evidence of arthropathy or other focal bone abnormality. Soft tissues are unremarkable. IMPRESSION: Negative. Electronically Signed   By: Ted Mcalpine M.D.   On: 07/15/2018 16:52   Dg Forearm Right  Result Date: 07/15/2018 CLINICAL DATA:  Right arm pain post fall. EXAM: RIGHT FOREARM - 2 VIEW COMPARISON:  None. FINDINGS: There is no evidence of fracture or other focal bone lesions. Soft tissues are unremarkable. IMPRESSION: Negative. Electronically Signed   By:  Ted Mcalpine M.D.   On: 07/15/2018 16:53   Dg Ankle Complete Right  Result Date: 07/15/2018 CLINICAL DATA:  Right ankle pain post fall. EXAM: RIGHT ANKLE - COMPLETE 3+ VIEW COMPARISON:  None. FINDINGS: There is no evidence of fracture, dislocation, or joint effusion. There is no evidence of arthropathy or other focal bone abnormality. Soft tissue swelling about the lateral malleolus. IMPRESSION: No acute fracture or dislocation identified about the right ankle. Soft tissue swelling about the lateral malleolus. Electronically Signed   By: Ted Mcalpine M.D.   On: 07/15/2018 16:54    Procedures Procedures (including critical care time)  Medications Ordered in UC Medications - No data to display  Initial Impression / Assessment and Plan / UC Course  I have reviewed the triage vital signs and the nursing notes.  Pertinent labs & imaging results that were available during my care of the patient were reviewed by me and considered in my medical decision making (see chart for details).     Patient with entire right side body pain, in order to minimize radiation obtained forearm and shoulder for right upper extremity, back pain seem to be more muscular, nontender midline, deferred imaging for back, most of pain with walking as an ankle and seem to be moving hip and knee when changing positions, x-ray of ankle obtained.  All negative.  Most likely strain/contusions causing discomfort.  Recommending anti-inflammatories and muscle relaxers.  Cause drowsiness regarding Flexeril.  Continue to monitor,Discussed strict return precautions. Patient verbalized understanding and is agreeable with plan.  Final Clinical Impressions(s) / UC Diagnoses   Final diagnoses:  Right arm pain  Right leg pain  Fall, initial encounter     Discharge Instructions  Use anti-inflammatories for pain/swelling. You may take up to 600 mg Ibuprofen every 8 hours with food. You may supplement Ibuprofen with  Tylenol [phone] mg every 8 hours.   You may use flexeril as needed to help with pain. This is a muscle relaxer and causes sedation- please use only at bedtime or when you will be home and not have to drive/work- begin with 1 tablet, may take 2 if not causing significant drowsiness  Ice  Rest  Follow up if symptoms not resolving or worsening   ED Prescriptions    Medication Sig Dispense Auth. Provider   ibuprofen (ADVIL,MOTRIN) 600 MG tablet Take 1 tablet (600 mg total) by mouth every 6 (six) hours as needed. 30 tablet Cass Vandermeulen C, PA-C   cyclobenzaprine (FLEXERIL) 5 MG tablet Take 1-2 tablets (5-10 mg total) by mouth 2 (two) times daily as needed for muscle spasms. 24 tablet Mandi Mattioli, [place] C, PA-C     Controlled Substance Prescriptions [place] Controlled Substance Registry consulted? Not Applicable   Lew Dawes, [place] 07/16/18 8546

## 2018-07-17 ENCOUNTER — Ambulatory Visit (INDEPENDENT_AMBULATORY_CARE_PROVIDER_SITE_OTHER): Payer: Medicaid Other | Admitting: Family Medicine

## 2018-07-17 ENCOUNTER — Other Ambulatory Visit: Payer: Self-pay

## 2018-07-17 ENCOUNTER — Encounter: Payer: Self-pay | Admitting: Family Medicine

## 2018-07-17 VITALS — BP 130/70 | HR 76 | Temp 98.2°F | Wt >= 6400 oz

## 2018-07-17 DIAGNOSIS — R300 Dysuria: Secondary | ICD-10-CM | POA: Diagnosis not present

## 2018-07-17 DIAGNOSIS — M545 Low back pain, unspecified: Secondary | ICD-10-CM

## 2018-07-17 DIAGNOSIS — H00014 Hordeolum externum left upper eyelid: Secondary | ICD-10-CM | POA: Insufficient documentation

## 2018-07-17 LAB — POCT URINALYSIS DIP (MANUAL ENTRY)
Glucose, UA: NEGATIVE mg/dL
Ketones, POC UA: NEGATIVE mg/dL
Nitrite, UA: NEGATIVE
PH UA: 6.5 (ref 5.0–8.0)
UROBILINOGEN UA: 1 U/dL

## 2018-07-17 NOTE — Progress Notes (Signed)
Subjective: Chief Complaint  Patient presents with  . Fall     HPI: Jillian Reed is a 38 y.o. presenting to clinic today to discuss the following:  1 Bump on Left Upper Eye Has been there for 3 weeks.  Patient states that she tried to squeeze it and some white stuff came out.  Got worse after she squeezed it.  Hasn't gotten better, has stayed the same.  She finds it irritating.  No hot compresses.  Hurts.  No draingage other than squeezing.  No fevers.  Not affected vision. Never had anything like this before.  2 Pelvic pain and spotting LMP 1/5.  Her periods usually last 3-4 days, heavy and painful.  No birth control.  IUD came out quickly.  Refused other birth control.  She has been spotting blood since the morning after her fall.  Also having sharp pains in pelvic area when she walks since her fall.  Patient slipped in the rain and fell on concrete outside of work.  She had to work the whole night.  Fell on right side.  Has been having the pain since then.  States that the pain feels different than regular menses pain.  Has needed assistance to get out of bed and get out of the shower.  Notes mild discomfort with urination, no increased frequency.  States that she has not been sexually active in years.  No changes in vaginal discharge, no vaginal odor.  3 Back Pain following fall Also has shooting back pains while sitting.  No new bladder or bowel incontinence.  Notes some numbness in her inner thighs but denies paresthesias in her groin.  Having left leg weakness.  "it feels like it's about to go out but I sit down before."  Back pain is mid back to lumbar area.  Rates the pain 7-10/10.      ROS noted in HPI.   Past Medical, Surgical, Social, and Family History Reviewed & Updated per EMR.   Pertinent Historical Findings include:   Social History   Tobacco Use  Smoking Status Never Smoker  Smokeless Tobacco Never Used      Objective: BP 130/70   Pulse 76   Temp 98.2  F (36.8 C) (Oral)   Wt (!) 408 lb 6 oz (185.2 kg)   LMP 06/21/2018   SpO2 97%   BMI 63.96 kg/m  Vitals and nursing notes reviewed  Physical Exam: General: 38 y.o. female in NAD Left Eye: small papule on inner corner of left upper lid lash line, no surrounding erythema, no drainage, not in line of vision Lungs: breathing comfortably on RA MSK: Able to stand, shuffling gait, unable to test strength of BLE given patient's lack of cooperation of pain B/L in extension and flexion of knee as well as hip, unable to flex or extend back 2/2 pain, TTP of spinous process L1-L4, diffuse TTP of BLE and hips   Results for orders placed or performed in visit on 07/17/18 (from the past 72 hour(s))  POCT urinalysis dipstick     Status: Abnormal   Collection Time: 07/17/18  9:35 AM  Result Value Ref Range   Color, UA yellow yellow   Clarity, UA hazy (A) clear   Glucose, UA negative negative mg/dL   Bilirubin, UA small (A) negative   Ketones, POC UA negative negative mg/dL   Spec Grav, UA >=7.741 (A) 1.010 - 1.025   Blood, UA trace-intact (A) negative   pH, UA 6.5  5.0 - 8.0   Protein Ur, POC trace (A) negative mg/dL   Urobilinogen, UA 1.0 0.2 or 1.0 E.U./dL   Nitrite, UA Negative Negative   Leukocytes, UA Trace (A) Negative    Assessment/Plan:  Dysuria Will check UA.  Patient's vaginal bleeding likely 2/2 stress.  Only mild spotting at present. - check UA - cont to monitor - patient given return precautions  Hordeolum externum of left upper eyelid Warm compresses x 1 week.  Return if no improvement or worsens.  Lumbar pain on palpation Point TTP of lumbar spine and recent fall.  Reassured as patient is able to walk.  No evidence of saddle anesthesia therefore no evidence of cauda equina.  Suspect MSK strain causing pain, but given point tenderness, will obtain lumbar films.   - tylenol or ibuprofen prn pain - rest, given note for work - lumbar spine x-ray     PATIENT EDUCATION  PROVIDED: See AVS    Diagnosis and plan along with any newly prescribed medication(s) were discussed in detail with this patient today. The patient verbalized understanding and agreed with the plan. Patient advised if symptoms worsen return to clinic or ER.     Orders Placed This Encounter  Procedures  . DG Lumbar Spine Complete    Standing Status:   Future    Standing Expiration Date:   09/15/2019    Order Specific Question:   Reason for Exam (SYMPTOM  OR DIAGNOSIS REQUIRED)    Answer:   back pain with point tenderness of lumbar spinous process    Order Specific Question:   Is patient pregnant?    Answer:   No    Order Specific Question:   Preferred imaging location?    Answer:   [id number]    Order Specific Question:   Radiology Contrast Protocol - do NOT remove file path    Answer:   [email or web address]  . POCT urinalysis dipstick    No orders of the defined types were placed in this encounter.    Luis Abed, DO 07/17/2018, 12:19 PM PGY-1 [facility]

## 2018-07-17 NOTE — Assessment & Plan Note (Signed)
Point TTP of lumbar spine and recent fall.  Reassured as patient is able to walk.  No evidence of saddle anesthesia therefore no evidence of cauda equina.  Suspect MSK strain causing pain, but given point tenderness, will obtain lumbar films.   - tylenol or ibuprofen prn pain - rest, given note for work - lumbar spine x-ray

## 2018-07-17 NOTE — Assessment & Plan Note (Signed)
Warm compresses x 1 week.  Return if no improvement or worsens.

## 2018-07-17 NOTE — Patient Instructions (Signed)
Thank you for coming to see me today. It was a pleasure. Today we talked about:   Your eye bump:  I believe this is a stye.  Use a warm compresses on your eye at least 2x a day for at least 30 seconds for the next two weeks.  If no improvement, return to the office.  Your back pain:  We will get x-rays and call you with the results.  Take tylenol or ibuprofen for pain and rest.  Your bleeding:  We will check your urine.  If it is positive, I will call and let you know.  It may just be that you are starting your period again.  Please follow-up for your anxiety.  If you back pain worsens, you develop groin numbness and tingling, or you have bowel or bladder incontinence, come to the doctor or go to the ER.  If you have any questions or concerns, please do not hesitate to call the office at [phone].  Best,   Luis Abed, DO

## 2018-07-17 NOTE — Assessment & Plan Note (Signed)
Will check UA.  Patient's vaginal bleeding likely 2/2 stress.  Only mild spotting at present. - check UA - cont to monitor - patient given return precautions

## 2018-07-25 ENCOUNTER — Telehealth: Payer: Self-pay | Admitting: Family Medicine

## 2018-07-28 ENCOUNTER — Other Ambulatory Visit: Payer: Self-pay | Admitting: Family Medicine

## 2018-08-16 ENCOUNTER — Other Ambulatory Visit: Payer: Self-pay | Admitting: Family Medicine

## 2018-08-16 DIAGNOSIS — G5793 Unspecified mononeuropathy of bilateral lower limbs: Secondary | ICD-10-CM

## 2018-08-18 ENCOUNTER — Ambulatory Visit (HOSPITAL_COMMUNITY)
Admission: EM | Admit: 2018-08-18 | Discharge: 2018-08-18 | Disposition: A | Discharge status: Home / Self Care | Payer: Medicaid Other | Attending: Physician Assistant | Admitting: Physician Assistant

## 2018-08-18 ENCOUNTER — Other Ambulatory Visit: Payer: Self-pay

## 2018-08-18 ENCOUNTER — Encounter (HOSPITAL_COMMUNITY): Payer: Self-pay | Admitting: *Deleted

## 2018-08-18 DIAGNOSIS — B349 Viral infection, unspecified: Secondary | ICD-10-CM | POA: Insufficient documentation

## 2018-08-18 LAB — POCT RAPID STREP A: Streptococcus, Group A Screen (Direct): NEGATIVE

## 2018-08-18 MED ORDER — FLUTICASONE PROPIONATE 50 MCG/ACT NA SUSP: 2.0000 | Freq: Every day | NASAL | 0 refills | Status: AC

## 2018-08-18 MED ORDER — LIDOCAINE VISCOUS HCL 2 % MT SOLN: OROMUCOSAL | 0 refills | Status: AC

## 2018-08-18 MED ORDER — IPRATROPIUM BROMIDE 0.06 % NA SOLN: 2.0000 | Freq: Four times a day (QID) | NASAL | 0 refills | Status: AC

## 2018-08-18 NOTE — ED Provider Notes (Signed)
[facility]    CSN: [id number] Arrival date & time: 08/18/18  1526     History   Chief Complaint Chief Complaint  Patient presents with  . Sore Throat    HPI Jillian Reed is a 38 y.o. female.   38 year old female comes in with 1 day history of sore throat, bilateral ear irritation.  States woke up with mild nasal congestion and cough as well.  Denies fever, chills, night sweats, body aches.  Painful swallowing without trouble breathing, swelling of the throat, tripoding, drooling, trismus.  Has tried DayQuil without relief.  No obvious sick contact. Never smoker.      Past Medical History:  Diagnosis Date  . Anemia   . Anxiety   . Depression    meds in teens; doing ok now  . GERD (gastroesophageal reflux disease)   . Headache   . Hypertension   . Infection    UTI  . Obesity   . Pregnancy induced hypertension   . Vaginal Pap smear, abnormal     Patient Active Problem List   Diagnosis Date Noted  . Lumbar pain on palpation 07/17/2018  . Hordeolum externum of left upper eyelid 07/17/2018  . Dysuria 07/17/2018  . Iron deficiency anemia 01/12/2018  . 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 Bilateral foot pain 02/23/2017  . Hypersomnia 06/18/2016  . Hiatal hernia 04/15/2016  . Leg pain 04/15/2016  . Depression 04/15/2016  . Seasonal allergies 03/04/2016  . Acid reflux 03/04/2016  . Essential hypertension 03/04/2016  . Anemia 03/04/2016  . Cramp of both lower extremities 03/04/2016  . Morbid obesity (HCC)   . Herpetic whitlow 08/29/2014  . Annual physical exam     Past Surgical History:  Procedure Laterality Date  . EXTERNAL EAR SURGERY    . THERAPEUTIC ABORTION      OB History    Gravida  2   Para  2   Term  2   Preterm  0   AB  0   Living  1     SAB  0   TAB  0   Ectopic  0   Multiple  0   Live Births  1            Home Medications    Prior to Admission medications   Medication Sig Start Date End Date Taking?  Authorizing Provider  cyclobenzaprine (FLEXERIL) 5 MG tablet Take 1-2 tablets (5-10 mg total) by mouth 2 (two) times daily as needed for muscle spasms. 07/15/18   Wieters, Hallie C, PA-C  DULoxetine (CYMBALTA) 30 MG capsule TAKE 1 CAPSULE BY MOUTH ONCE DAILY 08/17/18   Garth Bigness, MD  fluticasone [facility]) 50 MCG/ACT nasal spray Place 2 sprays into both nostrils daily. 08/18/18   Cathie Hoops, Maple Odaniel V, PA-C  gabapentin (NEURONTIN) 300 MG capsule Take 1 capsule (300 mg total) by mouth 2 (two) times daily. 01/11/18   Moses Manners, MD  ibuprofen (ADVIL,MOTRIN) 600 MG tablet Take 1 tablet (600 mg total) by mouth every 6 (six) hours as needed. 07/15/18   Wieters, Hallie C, PA-C  ipratropium (ATROVENT) 0.06 % nasal spray Place 2 sprays into both nostrils 4 (four) times daily. 08/18/18   Cathie Hoops, [place] Molina V, PA-C  lidocaine (XYLOCAINE) 2 % solution 5-15 mL gurgle as needed 08/18/18   Cathie Hoops, Catrena Vari V, PA-C  PHENTERMINE-TOPIRAMATE PO Take by mouth.    [provider]    Family History Family History  Problem Relation Age of Onset  .  Diabetes Mother   . Hypertension Mother   . Arthritis Mother   . Hyperlipidemia Mother   . Diabetes Father   . Hypertension Father   . Depression Father   . Heart disease Maternal Grandmother     Social History Social History   Tobacco Use  . Smoking status: Never Smoker  . Smokeless tobacco: Never Used  Substance Use Topics  . Alcohol use: Yes    Comment: wine every other week  . Drug use: No     Allergies   Percocet [oxycodone-acetaminophen] and Tramadol   Review of Systems Review of Systems  Reason unable to perform ROS: See HPI as above.     Physical Exam Triage Vital Signs ED Triage Vitals  Enc Vitals Group     BP 08/18/18 1555 (!) 145/86     Pulse Rate 08/18/18 1555 94     Resp 08/18/18 1555 18     Temp 08/18/18 1555 98.3 F (36.8 C)     Temp Source 08/18/18 1555 Oral     SpO2 08/18/18 1555 100 %     Weight --      Height --      Head  Circumference --      Peak Flow --      Pain Score 08/18/18 1554 5     Pain Loc --      Pain Edu? --      Excl. in GC? --    No data found.  Updated Vital Signs BP (!) 145/86 (BP Location: Right Arm)   Pulse 94   Temp 98.3 F (36.8 C) (Oral)   Resp 18   LMP 08/15/2018 (Exact Date)   SpO2 100%   Physical Exam Constitutional:      General: She is not in acute distress.    Appearance: She is well-developed. She is not ill-appearing, toxic-appearing or diaphoretic.  HENT:     Head: Normocephalic and atraumatic.     Right Ear: Ear canal and external ear normal. A middle ear effusion is present. Tympanic membrane is not erythematous or bulging.     Left Ear: Ear canal and external ear normal. A middle ear effusion is present. Tympanic membrane is not erythematous or bulging.     Nose:     Right Sinus: Maxillary sinus tenderness present. No frontal sinus tenderness.     Left Sinus: Maxillary sinus tenderness present. No frontal sinus tenderness.     Mouth/Throat:     Mouth: Mucous membranes are moist.     Pharynx: Oropharynx is clear. Uvula midline.     Tonsils: Tonsillar exudate present. Swelling: 1+ on the right. 1+ on the left.  Eyes:     Conjunctiva/sclera: Conjunctivae normal.     Pupils: Pupils are equal, round, and reactive to light.  Neck:     Musculoskeletal: Normal range of motion and neck supple.  Cardiovascular:     Rate and Rhythm: Normal rate and regular rhythm.     Heart sounds: Normal heart sounds. No murmur. No friction rub. No gallop.   Pulmonary:     Effort: Pulmonary effort is normal. No accessory muscle usage, prolonged expiration, respiratory distress or retractions.     Breath sounds: Normal breath sounds. No stridor, decreased air movement or transmitted upper airway sounds. No decreased breath sounds, wheezing, rhonchi or rales.  Skin:    General: Skin is warm and dry.  Neurological:     Mental Status: She is alert and oriented to person, place, and  time.      UC Treatments / Results  Labs (all labs ordered are listed, but only abnormal results are displayed) Labs Reviewed  CULTURE, GROUP A STREP [facility])  POCT RAPID STREP A    EKG None  Radiology No results found.  Procedures Procedures (including critical care time)  Medications Ordered in UC Medications - No data to display  Initial Impression / Assessment and Plan / UC Course  I have reviewed the triage vital signs and the nursing notes.  Pertinent labs & imaging results that were available during my care of the patient were reviewed by me and considered in my medical decision making (see chart for details).    Rapid strep negative. Patient is nontoxic in appearance. Symptomatic treatment as needed. Return precautions given.   Final Clinical Impressions(s) / UC Diagnoses   Final diagnoses:  Viral illness    ED Prescriptions    Medication Sig Dispense Auth. Provider   lidocaine (XYLOCAINE) 2 % solution 5-15 mL gurgle as needed 150 mL [place] Shimabukuro V, PA-C   fluticasone (FLONASE) 50 MCG/ACT nasal spray Place 2 sprays into both nostrils daily. 1 g Elier Zellars V, PA-C   ipratropium (ATROVENT) 0.06 % nasal spray Place 2 sprays into both nostrils 4 (four) times daily. 15 mL Threasa Alpha, [place] 08/18/18 1649

## 2018-08-18 NOTE — ED Triage Notes (Signed)
States she woke up this am with a sorethoat and both ears burning.

## 2018-08-18 NOTE — ED Notes (Signed)
Bed: UC01 Expected date: 08/18/18 Expected time: 3:36 PM Means of arrival:  Comments: For APPT

## 2018-08-18 NOTE — Discharge Instructions (Signed)
Rapid strep negative. Symptoms are most likely due to viral illness/ drainage down your throat. Start lidocaine for sore throat, do not eat or drink for the next 40 mins after use as it can stunt your gag reflex. Flonase, atrovent for nasal congestion/drainage. You can use over the counter nasal saline rinse such as neti pot for nasal congestion. Monitor for any worsening of symptoms, swelling of the throat, trouble breathing, trouble swallowing, leaning forward to breath, drooling, go to the emergency department for further evaluation needed. ° °

## 2018-08-21 LAB — CULTURE, GROUP A STREP (THRC)

## 2018-08-27 ENCOUNTER — Encounter (HOSPITAL_COMMUNITY): Payer: Self-pay

## 2018-08-27 ENCOUNTER — Ambulatory Visit (HOSPITAL_COMMUNITY)
Admission: EM | Admit: 2018-08-27 | Discharge: 2018-08-27 | Disposition: A | Discharge status: Home / Self Care | Payer: Medicaid Other | Attending: Family Medicine | Admitting: Family Medicine

## 2018-08-27 ENCOUNTER — Other Ambulatory Visit: Payer: Self-pay

## 2018-08-27 ENCOUNTER — Ambulatory Visit (INDEPENDENT_AMBULATORY_CARE_PROVIDER_SITE_OTHER): Payer: Medicaid Other

## 2018-08-27 DIAGNOSIS — M25561 Pain in right knee: Secondary | ICD-10-CM

## 2018-08-27 MED ORDER — NAPROXEN 500 MG PO TABS: 500.0000 mg | ORAL_TABLET | Freq: Two times a day (BID) | ORAL | 0 refills | Status: AC

## 2018-08-27 MED ORDER — PREDNISONE 50 MG PO TABS: 50.0000 mg | ORAL_TABLET | Freq: Every day | ORAL | 0 refills | Status: AC

## 2018-08-27 NOTE — ED Triage Notes (Signed)
Pt presents to [facility] for rt knee [ain x7 days, pt states she stepped wrong while boarding plane and hurt knee. Pt has taken Ibuprofen to treat pain  But has no relief

## 2018-08-27 NOTE — Discharge Instructions (Signed)
Prednisone daily for 5 days Ice and elevate knee Use anti-inflammatories for pain/swelling. You may take up to 800 mg Ibuprofen every 8 hours with food. You may supplement Ibuprofen with Tylenol [phone] mg every 8 hours.  OR Naprosyn twice daily with food after finishing Prednisone

## 2018-08-28 NOTE — ED Provider Notes (Signed)
[facility]    CSN: [id number] Arrival date & time: 08/27/18  1416     History   Chief Complaint Chief Complaint  Patient presents with  . Knee Pain    HPI Jillian Reed is a 38 y.o. female history of hypertension, obesity, presenting today for evaluation of right knee pain.  She denies any specific injury, but does note that a week ago when she was on an airplane, she went to sit in her knee awkwardly shifted.  Since she has had pain especially with weightbearing.  Denies any fall directly onto the knee.  Denies previous knee issues, but does note a couple months ago she did fall and landed on her right side and has had some mild pain with her knee then.  She has been taking ibuprofen as well as Flexeril with mild relief.  She has seen minimal improvement over the past week.  Pain also increases with bending her knee.  Has pain radiating into calf.  Denies any leg swelling.  Patient is moving to [place] in the next week and starting a new job and she is hoping to have her knee pain resolved by then. HPI  Past Medical History:  Diagnosis Date  . Anemia   . Anxiety   . Depression    meds in teens; doing ok now  . GERD (gastroesophageal reflux disease)   . Headache   . Hypertension   . Infection    UTI  . Obesity   . Pregnancy induced hypertension   . Vaginal Pap smear, abnormal     Patient Active Problem List   Diagnosis Date Noted  . Lumbar pain on palpation 07/17/2018  . Hordeolum externum of left upper eyelid 07/17/2018  . Dysuria 07/17/2018  . Iron deficiency anemia 01/12/2018  . 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 Bilateral foot pain 02/23/2017  . Hypersomnia 06/18/2016  . Hiatal hernia 04/15/2016  . Leg pain 04/15/2016  . Depression 04/15/2016  . Seasonal allergies 03/04/2016  . Acid reflux 03/04/2016  . Essential hypertension 03/04/2016  . Anemia 03/04/2016  . Cramp of both lower extremities 03/04/2016  . Morbid obesity (HCC)   . Herpetic whitlow  08/29/2014  . Annual physical exam     Past Surgical History:  Procedure Laterality Date  . EXTERNAL EAR SURGERY    . THERAPEUTIC ABORTION      OB History    Gravida  2   Para  2   Term  2   Preterm  0   AB  0   Living  1     SAB  0   TAB  0   Ectopic  0   Multiple  0   Live Births  1            Home Medications    Prior to Admission medications   Medication Sig Start Date End Date Taking? Authorizing Provider  cyclobenzaprine (FLEXERIL) 5 MG tablet Take 1-2 tablets (5-10 mg total) by mouth 2 (two) times daily as needed for muscle spasms. 07/15/18  Yes Cailan General C, PA-C  DULoxetine (CYMBALTA) 30 MG capsule TAKE 1 CAPSULE BY MOUTH ONCE DAILY 08/17/18  Yes Garth Bigness, MD  fluticasone [facility]) 50 MCG/ACT nasal spray Place 2 sprays into both nostrils daily. 08/18/18  Yes Yu, Amy V, PA-C  gabapentin (NEURONTIN) 300 MG capsule Take 1 capsule (300 mg total) by mouth 2 (two) times daily. 01/11/18  Yes Hensel, Santiago Bumpers, MD  ibuprofen (ADVIL,MOTRIN) 600  MG tablet Take 1 tablet (600 mg total) by mouth every 6 (six) hours as needed. 07/15/18  Yes Waymond Meador C, PA-C  ipratropium (ATROVENT) 0.06 % nasal spray Place 2 sprays into both nostrils 4 (four) times daily. 08/18/18  Yes Yu, Amy V, PA-C  PHENTERMINE-TOPIRAMATE PO Take by mouth.   Yes [provider]  lidocaine (XYLOCAINE) 2 % solution 5-15 mL gurgle as needed 08/18/18   Cathie Hoops, Amy V, PA-C  naproxen (NAPROSYN) 500 MG tablet Take 1 tablet (500 mg total) by mouth 2 (two) times daily. 08/27/18   Attikus Bartoszek C, PA-C  predniSONE (DELTASONE) 50 MG tablet Take 1 tablet (50 mg total) by mouth daily for 5 days. 08/27/18 09/01/18  Leyland Kenna, Junius Creamer, PA-C    Family History Family History  Problem Relation Age of Onset  . Diabetes Mother   . Hypertension Mother   . Arthritis Mother   . Hyperlipidemia Mother   . Diabetes Father   . Hypertension Father   . Depression Father   . Heart disease Maternal  Grandmother     Social History Social History   Tobacco Use  . Smoking status: Never Smoker  . Smokeless tobacco: Never Used  Substance Use Topics  . Alcohol use: Yes    Comment: wine every other week  . Drug use: No     Allergies   Percocet [oxycodone-acetaminophen] and Tramadol   Review of Systems Review of Systems  Constitutional: Negative for fatigue and fever.  HENT: Negative for mouth sores.   Eyes: Negative for visual disturbance.  Respiratory: Negative for shortness of breath.   Cardiovascular: Negative for chest pain.  Gastrointestinal: Negative for abdominal pain, nausea and vomiting.  Musculoskeletal: Positive for arthralgias, gait problem and joint swelling.  Skin: Negative for color change, rash and wound.  Neurological: Negative for dizziness, weakness, light-headedness and headaches.     Physical Exam Triage Vital Signs ED Triage Vitals  Enc Vitals Group     BP 08/27/18 1516 136/87     Pulse Rate 08/27/18 1516 67     Resp 08/27/18 1516 16     Temp 08/27/18 1516 98.3 F (36.8 C)     Temp Source 08/27/18 1516 Oral     SpO2 08/27/18 1516 100 %     Weight --      Height --      Head Circumference --      Peak Flow --      Pain Score 08/27/18 1518 7     Pain Loc --      Pain Edu? --      Excl. in GC? --    No data found.  Updated Vital Signs BP 136/87 (BP Location: Left Arm)   Pulse 67   Temp 98.3 F (36.8 C) (Oral)   Resp 16   LMP 08/15/2018 (Exact Date)   SpO2 99%   Visual Acuity Right Eye Distance:   Left Eye Distance:   Bilateral Distance:    Right Eye Near:   Left Eye Near:    Bilateral Near:     Physical Exam Vitals signs and nursing note reviewed.  Constitutional:      Appearance: She is well-developed.     Comments: No acute distress  HENT:     Head: Normocephalic and atraumatic.     Nose: Nose normal.  Eyes:     Conjunctiva/sclera: Conjunctivae normal.  Neck:     Musculoskeletal: Neck supple.  Cardiovascular:      Rate and  Rhythm: Normal rate.  Pulmonary:     Effort: Pulmonary effort is normal. No respiratory distress.  Abdominal:     General: There is no distension.  Musculoskeletal: Normal range of motion.     Comments: Right knee with no obvious swelling, difficult to ascertain due to body habitus, no overlying erythema or increased warmth, nontender over patella, mild tenderness to medial joint line extending to right popliteal area, nontender along lateral joint line, mild tenderness to the infrapatellar area Limited range of motion due to pain Ambulating with antalgia  Skin:    General: Skin is warm and dry.  Neurological:     Mental Status: She is alert and oriented to person, place, and time.      UC Treatments / Results  Labs (all labs ordered are listed, but only abnormal results are displayed) Labs Reviewed - No data to display  EKG None  Radiology Dg Knee Complete 4 Views Right  Result Date: 08/27/2018 CLINICAL DATA:  Right knee pain for the past week. History patellar subluxation. EXAM: RIGHT KNEE - COMPLETE 4+ VIEW COMPARISON:  None. FINDINGS: No fracture or dislocation. Suspected small knee joint effusion. No evidence of lipohemarthrosis. 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 No evidence of chondrocalcinosis. Regional soft tissues appear normal. IMPRESSION: 1. Small knee joint effusion.  Otherwise, no acute findings. 2. 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 Electronically Signed   By: Simonne Come M.D.   On: 08/27/2018 16:03    Procedures Procedures (including critical care time)  Medications Ordered in UC Medications - No data to display  Initial Impression / Assessment and Plan / UC Course  I have reviewed the triage vital signs and the nursing notes.  Pertinent labs & imaging results that were  available during my care of the patient were reviewed by me and considered in my medical decision making (see chart for details).     X-ray suggestive of arthritic changes largely on medial joint line, likely awkward maneuver of knee flared up arthritis.  Will treat with prednisone, ice and elevation.  Continue NSAIDs after finishing prednisone.  Advised that taking prednisone along with NSAIDs may increase the risk of GI bleed or ulcers.  Ace wrap provided.Discussed strict return precautions. Patient verbalized understanding and is agreeable with plan.  Final Clinical Impressions(s) / UC Diagnoses   Final diagnoses:  Acute pain of right knee     Discharge Instructions     Prednisone daily for 5 days Ice and elevate knee Use anti-inflammatories for pain/swelling. You may take up to 800 mg Ibuprofen every 8 hours with food. You may supplement Ibuprofen with Tylenol [phone] mg every 8 hours.  OR Naprosyn twice daily with food after finishing Prednisone   ED Prescriptions    Medication Sig Dispense Auth. Provider   predniSONE (DELTASONE) 50 MG tablet Take 1 tablet (50 mg total) by mouth daily for 5 days. 5 tablet Natalyn Szymanowski C, PA-C   naproxen (NAPROSYN) 500 MG tablet Take 1 tablet (500 mg total) by mouth 2 (two) times daily. 30 tablet Morghan Kester, [place] C, PA-C     Controlled Substance Prescriptions [place] Controlled Substance Registry consulted? Not Applicable   Lew Dawes, [place] 08/28/18 1014

## 2020-12-03 IMAGING — DX DG FOREARM 2V*R*
2 series · 2 of 2 positions shown · non-contrast
Comparison: None.

CLINICAL DATA: Right arm pain post fall.

EXAM:
RIGHT FOREARM - 2 VIEW

[forearm ap]
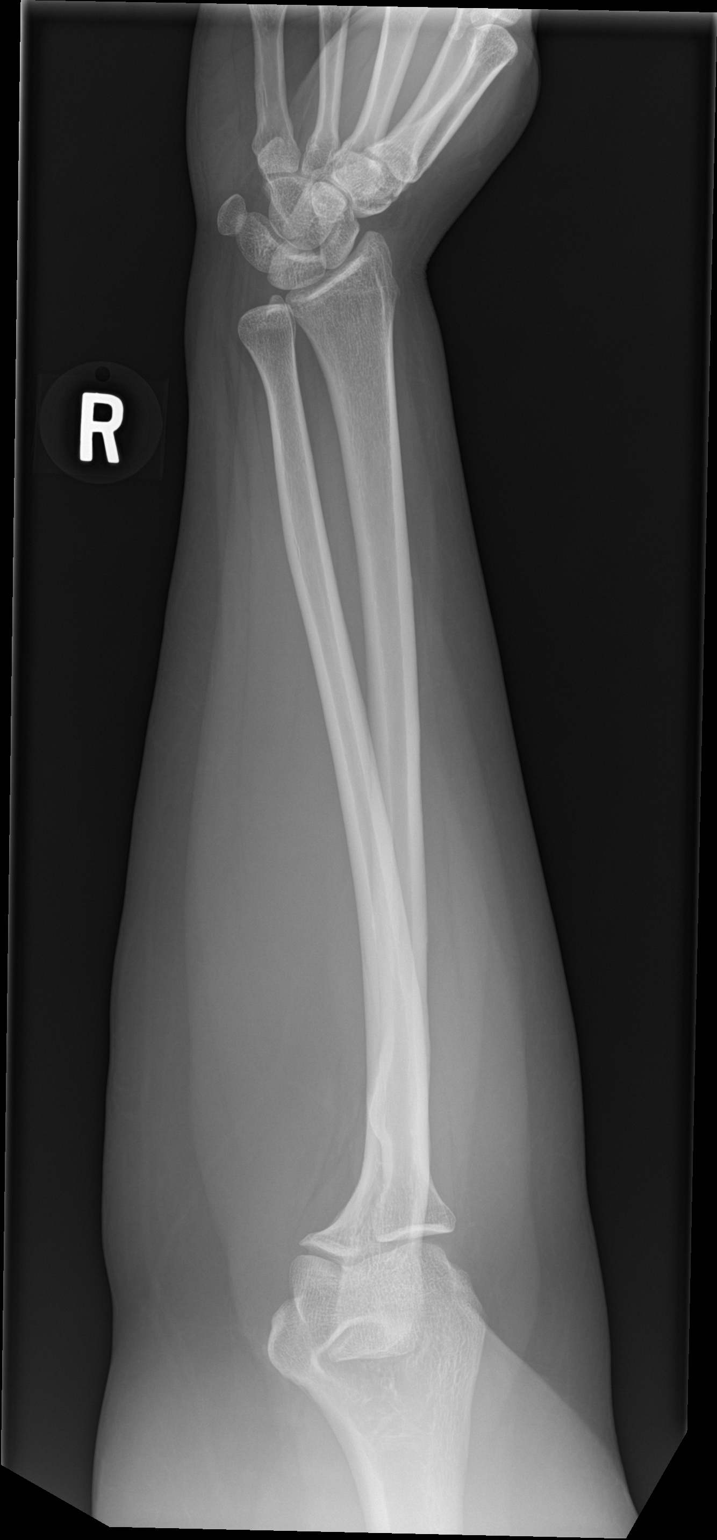

[forearm lat]
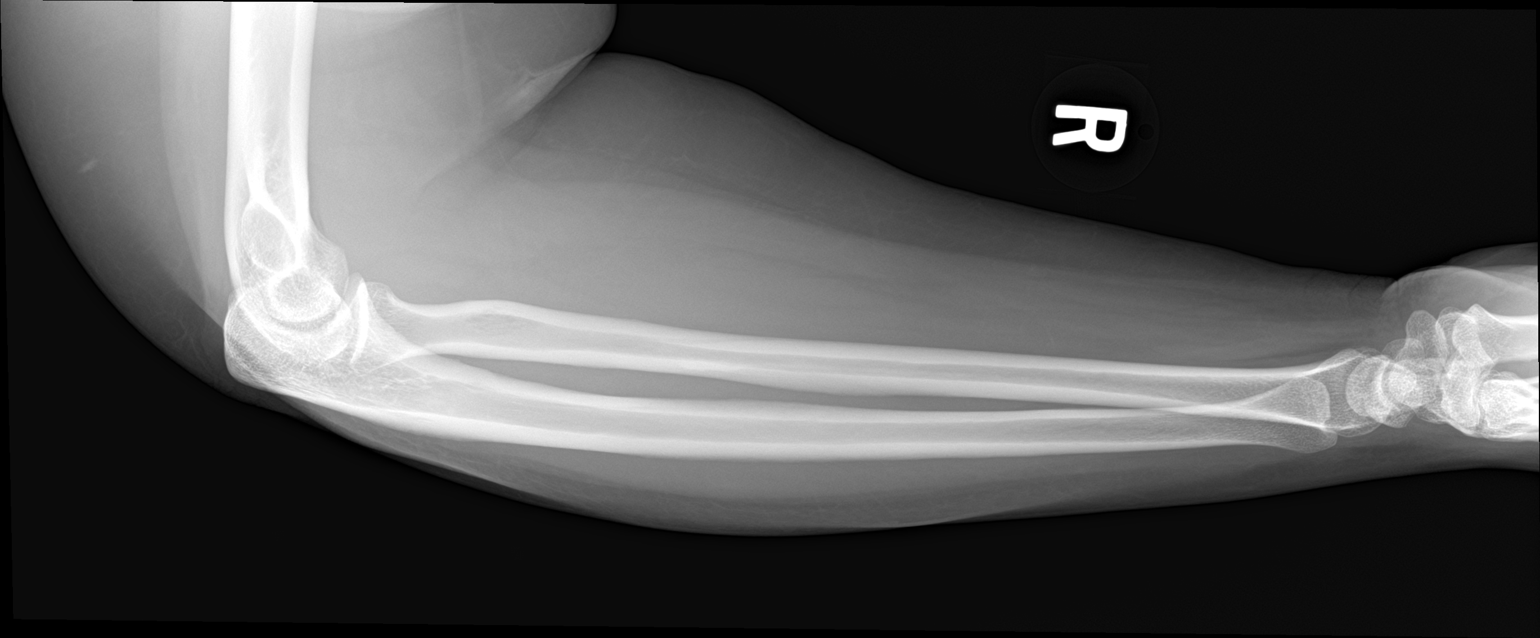

[2 of 2 positions shown; findings below may reference images not displayed]

FINDINGS: There is no evidence of fracture or other focal bone lesions. Soft
tissues are unremarkable.
IMPRESSION: Negative.

## 2021-11-24 ENCOUNTER — Encounter: Payer: Self-pay | Admitting: *Deleted
